# Patient Record
Sex: Female | Born: 1965 | Race: White | Hispanic: No | Smoking: Never smoker
Health system: Southern US, Community
[De-identification: ages and names within clinical notes are randomized; demographics above are authoritative.]

## PROBLEM LIST (undated history)

## (undated) DIAGNOSIS — R030 Elevated blood-pressure reading, without diagnosis of hypertension: Secondary | ICD-10-CM

## (undated) DIAGNOSIS — R32 Unspecified urinary incontinence: Secondary | ICD-10-CM

## (undated) DIAGNOSIS — Z8619 Personal history of other infectious and parasitic diseases: Secondary | ICD-10-CM

## (undated) DIAGNOSIS — I10 Essential (primary) hypertension: Secondary | ICD-10-CM

## (undated) DIAGNOSIS — G43909 Migraine, unspecified, not intractable, without status migrainosus: Secondary | ICD-10-CM

## (undated) DIAGNOSIS — R87629 Unspecified abnormal cytological findings in specimens from vagina: Secondary | ICD-10-CM

## (undated) DIAGNOSIS — D649 Anemia, unspecified: Secondary | ICD-10-CM

## (undated) DIAGNOSIS — K219 Gastro-esophageal reflux disease without esophagitis: Secondary | ICD-10-CM

## (undated) DIAGNOSIS — R0609 Other forms of dyspnea: Secondary | ICD-10-CM

## (undated) DIAGNOSIS — F410 Panic disorder [episodic paroxysmal anxiety] without agoraphobia: Secondary | ICD-10-CM

## (undated) DIAGNOSIS — J189 Pneumonia, unspecified organism: Secondary | ICD-10-CM

## (undated) DIAGNOSIS — F4310 Post-traumatic stress disorder, unspecified: Secondary | ICD-10-CM

## (undated) DIAGNOSIS — C541 Malignant neoplasm of endometrium: Secondary | ICD-10-CM

## (undated) DIAGNOSIS — M1990 Unspecified osteoarthritis, unspecified site: Secondary | ICD-10-CM

## (undated) DIAGNOSIS — Z8659 Personal history of other mental and behavioral disorders: Secondary | ICD-10-CM

## (undated) DIAGNOSIS — F32A Depression, unspecified: Secondary | ICD-10-CM

## (undated) DIAGNOSIS — I209 Angina pectoris, unspecified: Secondary | ICD-10-CM

## (undated) DIAGNOSIS — R600 Localized edema: Secondary | ICD-10-CM

## (undated) DIAGNOSIS — G8929 Other chronic pain: Secondary | ICD-10-CM

## (undated) DIAGNOSIS — F419 Anxiety disorder, unspecified: Secondary | ICD-10-CM

## (undated) DIAGNOSIS — Z8711 Personal history of peptic ulcer disease: Secondary | ICD-10-CM

## (undated) DIAGNOSIS — M797 Fibromyalgia: Secondary | ICD-10-CM

## (undated) DIAGNOSIS — Z862 Personal history of diseases of the blood and blood-forming organs and certain disorders involving the immune mechanism: Secondary | ICD-10-CM

## (undated) DIAGNOSIS — M549 Dorsalgia, unspecified: Secondary | ICD-10-CM

## (undated) DIAGNOSIS — N3946 Mixed incontinence: Secondary | ICD-10-CM

## (undated) HISTORY — PX: PERINEAL WOUND CLOSURE: SHX2217

## (undated) HISTORY — DX: Unspecified abnormal cytological findings in specimens from vagina: R87.629

## (undated) HISTORY — PX: APPENDECTOMY: SHX54

## (undated) HISTORY — PX: WISDOM TOOTH EXTRACTION: SHX21

## (undated) HISTORY — DX: Malignant neoplasm of endometrium: C54.1

## (undated) HISTORY — PX: BACK SURGERY: SHX140

## (undated) HISTORY — PX: OTHER SURGICAL HISTORY: SHX169

## (undated) HISTORY — PX: LUMBAR FUSION: SHX111

---

## 2007-05-02 ENCOUNTER — Ambulatory Visit (HOSPITAL_COMMUNITY): Admission: RE | Admit: 2007-05-02 | Discharge: 2007-05-02 | Payer: Self-pay | Admitting: Family Medicine

## 2007-05-02 ENCOUNTER — Emergency Department (HOSPITAL_COMMUNITY): Admission: EM | Admit: 2007-05-02 | Discharge: 2007-05-02 | Payer: Self-pay | Admitting: Emergency Medicine

## 2010-07-09 NOTE — Consult Note (Signed)
[name], [name] NO.:  [id number]   MEDICAL RECORD NO.:  [id number]          PATIENT TYPE:  EMS   LOCATION:  MAJO                         FACILITY:  [facility]   PHYSICIAN:  Cristi Loron, M.D.DATE OF BIRTH:  March 03, 1965   DATE OF CONSULTATION:  05/02/2007  DATE OF DISCHARGE:                                 CONSULTATION   CHIEF COMPLAINT:  Dizziness.   HISTORY OF PRESENT ILLNESS:  The patient is a 45 year old white female  who attempted to jump over evidently cracking some concrete last evening  approximately midnight when she fell striking her head.  There was brief  altered level of consciousness.  She did have some nausea and vomiting.  The patient went to sleep and this morning woke up with some dizziness  and vertigo.  She went to an urgent care where they ordered a CAT scan.  It demonstrated a small intracerebral hemorrhage and they recommended  she go to the emergency department.  The patient was evaluated by Dr.  Lisbeth Renshaw al.,  and a neurosurgical consultation was requested.   Presently, the patient admits to a minimal headache.  She still has some  dizziness and vertigo.  She has some chronic numbness in her right leg  but nothing new since the fall.  She denies neck pain, back pain,  weakness, etc.   PAST MEDICAL HISTORY:  The patient has history of lumbar spine problems.  She has had 2 lumbar fusions by neurosurgeons from [place].  She has had  chronic pain syndrome and is followed there.  Remote history of  appendicitis and stress urinary incontinence.   PAST SURGICAL HISTORY:  She has had 2 lumbar fusions as above in  [place], appendectomy, and repair with episiotomy.   MEDICATIONS:  Prior to admission, p.r.n. hydrocodone, methadone,  clonazepam, Topamax, and Detrol.   DRUG ALLERGIES:  CELEBREX causes swelling.   FAMILY MEDICAL HISTORY:  This patient's mother is age 44 and healthy.  The patient's father died of gunshot wound at age  38.   SOCIAL HISTORY:  The patient is married.  She has 2 children.  She lives  in [place], [place].  She is not employed.  She denies tobacco and  drug use.  She occasionally drinks alcohol.   REVIEW OF SYSTEMS:  Negative except as above.   PHYSICAL EXAMINATION:  GENERAL: A pleasant 45 year old white female in  no apparent distress.  HEENT: Normocephalic.  Pupils are equal, round, and reactive to light.  Extraocular muscles are intact.  Oropharynx is benign.  There was no  Battle sign or raccoon eyes.  No evidence of CSF, otorrhea, rhinorrhea,  hemotympanum, etc.  NECK: Supple.  There are no masses or deformities.  Good range of  motion.  No deformities, tracheal deviation etc.  Spurling testing is  negative.  Lhermitte sign was not present.  THORAX: Symmetric.  LUNGS: Clear to auscultation.  HEART: Regular rate and rhythm.  ABDOMEN: Soft and nontender.  EXTREMITIES: No obvious deformities.  BACK EXAM: The patient has a well-healed lumbar incision.  No signs of  infection.  NEUROLOGIC EXAM: The patient is alert and oriented x3.  Glasgow Coma  Scale 15.  Cranial nerves II through XII are examined bilaterally.  Grossly normal vision.  Hearing grossly normal bilaterally.  Motor  strength is 5/5 in her deltoids, biceps, triceps, hand grip, psoas,  quadriceps, gastrocnemius, and left extensor hallucis longus.  She gives  way in her right EHL.  Sensory exam demonstrates some decreased light  touch sensitivity in right lower extremity without change from baseline  status according to the patient.  Cerebellar function is intact and  rapid alternating movements of the upper extremities bilaterally.  Deep  tendon reflexes are 2/4 in bilateral biceps, triceps, brachioradialis,  quadriceps, and right gastrocnemius.  Trace 1/4 in left gastrocnemius.  There is no ankle clonus.   IMAGING STUDIES:  I reviewed the patient's cranial CT scans performed  today without contrast.  It  demonstrates the patient has a tiny inner  hemisphere of subdural hematoma and a tiny subarachnoid hemorrhage on  the left.   ASSESSMENT AND PLAN:  Closed head injury, subarachnoid hemorrhage, etc.  I have discussed the injury  with the patient and her cousin and  answered the patient's questions.  I have told her that she has minimal  intracerebral hemorrhage.  Presently, she is doing well.  I offered to  admit her for observation, but she prefers to go home.  Since this  injury was approximately 15 hours ago, I think that is fine and she is  going to be in the presence of her cousin for the next 24 hours.  I did  discuss whether should she develop decreased mental status, weakness,  mental status changes, etc., then she needs to come back to the  emergency room immediately.  I offered to have her follow up with me,  but it will be more convenient for her to follow up with a neurosurgeon  at [facility].  I gave her my card and I will discharge her and plan to see  her on p.r.n. basis except as above.  We also discussed the entity of  postconcussion syndrome.  I have answered all her questions.      Cristi Loron, M.D.  Electronically Signed     JDJ/MEDQ  D:  05/02/2007  T:  05/03/2007  Job:  [id number]

## 2010-11-18 LAB — I-STAT 8, (EC8 V) (CONVERTED LAB)
BUN: 7
Bicarbonate: 22.7
Chloride: 107
Glucose, Bld: 98
HCT: 44
Hemoglobin: 15
Operator id: 294521
Potassium: 3.7
Sodium: 137
TCO2: 24
pCO2, Ven: 31.9 — ABNORMAL LOW
pH, Ven: 7.46 — ABNORMAL HIGH

## 2010-11-18 LAB — DIFFERENTIAL
Basophils Absolute: 0.1
Basophils Relative: 0
Eosinophils Absolute: 0
Eosinophils Relative: 0
Lymphocytes Relative: 18
Lymphs Abs: 2.6
Monocytes Absolute: 0.8
Monocytes Relative: 6
Neutro Abs: 10.8 — ABNORMAL HIGH
Neutrophils Relative %: 76

## 2010-11-18 LAB — CBC
HCT: 39.9
Hemoglobin: 13.6
MCHC: 34.1
MCV: 83.8
Platelets: 381
RBC: 4.76
RDW: 13.8
WBC: 14.2 — ABNORMAL HIGH

## 2010-11-18 LAB — APTT: aPTT: 30

## 2010-11-18 LAB — PROTIME-INR
INR: 1
Prothrombin Time: 13.3

## 2010-11-18 LAB — POCT I-STAT CREATININE
Creatinine, Ser: 0.5
Operator id: 294521

## 2011-10-15 ENCOUNTER — Emergency Department (HOSPITAL_COMMUNITY)
Admission: EM | Admit: 2011-10-15 | Discharge: 2011-10-16 | Disposition: A | Discharge status: Home / Self Care | Payer: Self-pay | Attending: Emergency Medicine | Admitting: Emergency Medicine

## 2011-10-15 ENCOUNTER — Encounter (HOSPITAL_COMMUNITY): Payer: Self-pay | Admitting: Emergency Medicine

## 2011-10-15 DIAGNOSIS — N949 Unspecified condition associated with female genital organs and menstrual cycle: Secondary | ICD-10-CM | POA: Insufficient documentation

## 2011-10-15 DIAGNOSIS — N72 Inflammatory disease of cervix uteri: Secondary | ICD-10-CM | POA: Insufficient documentation

## 2011-10-15 DIAGNOSIS — N739 Female pelvic inflammatory disease, unspecified: Secondary | ICD-10-CM | POA: Insufficient documentation

## 2011-10-15 DIAGNOSIS — N938 Other specified abnormal uterine and vaginal bleeding: Secondary | ICD-10-CM | POA: Insufficient documentation

## 2011-10-15 NOTE — ED Notes (Signed)
Pt alert, arrives from home, c/o abd cramping, vaginal bleeding, onset was several days ago, resp even unlabored, skin pwd

## 2011-10-16 ENCOUNTER — Emergency Department (HOSPITAL_COMMUNITY): Payer: Self-pay

## 2011-10-16 LAB — BASIC METABOLIC PANEL
BUN: 10 mg/dL (ref 6–23)
CO2: 24 mEq/L (ref 19–32)
Calcium: 9.6 mg/dL (ref 8.4–10.5)
Chloride: 99 mEq/L (ref 96–112)
Creatinine, Ser: 0.6 mg/dL (ref 0.50–1.10)
GFR calc Af Amer: >90 mL/min (ref >90)
GFR calc non Af Amer: >90 mL/min (ref >90)
Glucose, Bld: 108 mg/dL — ABNORMAL HIGH (ref 70–99)
Potassium: 3.8 mEq/L (ref 3.5–5.1)
Sodium: 136 mEq/L (ref 135–145)

## 2011-10-16 LAB — URINALYSIS, ROUTINE W REFLEX MICROSCOPIC
Glucose, UA: NEGATIVE mg/dL
Nitrite: NEGATIVE
Protein, ur: 30 mg/dL — AB
Specific Gravity, Urine: 1.029 (ref 1.005–1.030)
Urobilinogen, UA: 1 mg/dL (ref 0.0–1.0)
pH: 6 (ref 5.0–8.0)

## 2011-10-16 LAB — CBC WITH DIFFERENTIAL/PLATELET
Basophils Absolute: 0 10*3/uL (ref 0.0–0.1)
Basophils Relative: 0 % (ref 0–1)
Eosinophils Absolute: 0 10*3/uL (ref 0.0–0.7)
Eosinophils Relative: 0 % (ref 0–5)
HCT: 40 % (ref 36.0–46.0)
Hemoglobin: 13.7 g/dL (ref 12.0–15.0)
Lymphocytes Relative: 25 % (ref 12–46)
Lymphs Abs: 3.2 10*3/uL (ref 0.7–4.0)
MCH: 26.9 pg (ref 26.0–34.0)
MCHC: 34.3 g/dL (ref 30.0–36.0)
MCV: 78.4 fL (ref 78.0–100.0)
Monocytes Absolute: 0.8 10*3/uL (ref 0.1–1.0)
Monocytes Relative: 6 % (ref 3–12)
Neutro Abs: 9 10*3/uL — ABNORMAL HIGH (ref 1.7–7.7)
Neutrophils Relative %: 69 % (ref 43–77)
Platelets: 400 10*3/uL (ref 150–400)
RBC: 5.1 MIL/uL (ref 3.87–5.11)
RDW: 14.8 % (ref 11.5–15.5)
WBC: 13 10*3/uL — ABNORMAL HIGH (ref 4.0–10.5)

## 2011-10-16 LAB — WET PREP, GENITAL
Trich, Wet Prep: NONE SEEN
Yeast Wet Prep HPF POC: NONE SEEN

## 2011-10-16 LAB — GC/CHLAMYDIA PROBE AMP, GENITAL
Chlamydia, DNA Probe: NEGATIVE
GC Probe Amp, Genital: NEGATIVE

## 2011-10-16 LAB — URINE MICROSCOPIC-ADD ON

## 2011-10-16 LAB — PREGNANCY, URINE: Preg Test, Ur: NEGATIVE

## 2011-10-16 MED ORDER — ACETAMINOPHEN-CODEINE #4 300-60 MG PO TABS: 1.0000 | ORAL_TABLET | ORAL | Status: AC | PRN

## 2011-10-16 MED ORDER — SODIUM CHLORIDE 0.9 % IV BOLUS (SEPSIS)
1000.0000 mL | Freq: Once | INTRAVENOUS | Status: AC
  Administered 2011-10-16: 1000 mL via INTRAVENOUS

## 2011-10-16 MED ORDER — KETOROLAC TROMETHAMINE 30 MG/ML IJ SOLN
30.0000 mg | Freq: Once | INTRAMUSCULAR | Status: AC
  Administered 2011-10-16: 30 mg via INTRAVENOUS
  Filled 2011-10-16: qty 1

## 2011-10-16 MED ORDER — SODIUM CHLORIDE 0.9 % IV SOLN
Freq: Once | INTRAVENOUS | Status: AC
  Administered 2011-10-16: 04:00:00 via INTRAVENOUS

## 2011-10-16 MED ORDER — DOXYCYCLINE HYCLATE 100 MG PO CAPS: 100.0000 mg | ORAL_CAPSULE | Freq: Two times a day (BID) | ORAL | Status: AC

## 2011-10-16 MED ORDER — DOXYCYCLINE HYCLATE 100 MG PO TABS
200.0000 mg | ORAL_TABLET | Freq: Once | ORAL | Status: AC
  Administered 2011-10-16: 200 mg via ORAL
  Filled 2011-10-16: qty 2

## 2011-10-16 MED ORDER — ONDANSETRON HCL 4 MG/2ML IJ SOLN
4.0000 mg | Freq: Once | INTRAMUSCULAR | Status: AC
  Administered 2011-10-16: 4 mg via INTRAVENOUS
  Filled 2011-10-16: qty 2

## 2011-10-16 MED ORDER — HYDROMORPHONE HCL PF 1 MG/ML IJ SOLN
1.0000 mg | Freq: Once | INTRAMUSCULAR | Status: AC
  Administered 2011-10-16: 1 mg via INTRAVENOUS
  Filled 2011-10-16: qty 1

## 2011-10-16 MED ORDER — ONDANSETRON 8 MG PO TBDP: 8.0000 mg | ORAL_TABLET | Freq: Three times a day (TID) | ORAL | Status: AC | PRN

## 2011-10-16 NOTE — ED Notes (Signed)
Pt to US via stretcher

## 2011-10-16 NOTE — ED Provider Notes (Signed)
History     CSN: [id number]  Arrival date & time 10/15/11  2316   First MD Initiated Contact with Patient 10/16/11 0301      Chief Complaint  Patient presents with  . Abdominal Pain    (Consider location/radiation/quality/duration/timing/severity/associated sxs/prior treatment) HPI Comments: Pt comes in with cc of suprapubic pain and vaginal bleeding. Pt has been having bleeding x 3 days, with LMP being just 2 weeks ago. She has hx of ASCUS. The bleeding is similar to her periods, with clots coming out. Pt feels heaviness in the suprapubic region. Pt also has lower quadrants and suprapubic pain - crampy, but sharp and throbbing. No trauma. No vaginal discharge. The pain is non radiating, and has worsened overnight. No UTI like sx, and no hx of renal stones.   Patient is a 46 y.o. female presenting with abdominal pain. The history is provided by the patient.  Abdominal Pain The primary symptoms of the illness include abdominal pain and vaginal bleeding. The primary symptoms of the illness do not include shortness of breath, nausea, vomiting, diarrhea or dysuria.  Symptoms associated with the illness do not include constipation, hematuria or frequency.    History reviewed. No pertinent past medical history.  Past Surgical History  Procedure Date  . Back surgery     No family history on file.  History  Substance Use Topics  . Smoking status: Never Smoker   . Smokeless tobacco: Not on file  . Alcohol Use: No    OB History    Grav Para Term Preterm Abortions TAB SAB Ect Mult Living                  Review of Systems  Constitutional: Negative for activity change.  HENT: Negative for facial swelling and neck pain.   Respiratory: Negative for cough, shortness of breath and wheezing.   Cardiovascular: Negative for chest pain.  Gastrointestinal: Positive for abdominal pain. Negative for nausea, vomiting, diarrhea, constipation, blood in stool and abdominal distention.    Genitourinary: Positive for vaginal bleeding. Negative for dysuria, frequency, hematuria, flank pain and difficulty urinating.  Skin: Negative for color change.  Neurological: Negative for speech difficulty and headaches.  Hematological: Does not bruise/bleed easily.  Psychiatric/Behavioral: Negative for confusion.    Allergies  Celebrex and Oxycontin  Home Medications   Current Outpatient Rx  Name Route Sig Dispense Refill  . ACETAMINOPHEN 500 MG PO TABS Oral Take 1,500 mg by mouth every 6 (six) hours as needed. Headache    . PSEUDOEPHEDRINE HCL 60 MG PO TABS Oral Take 60 mg by mouth every 4 (four) hours as needed. Sinuses    . ACETAMINOPHEN-CODEINE #4 300-60 MG PO TABS Oral Take 1 tablet by mouth every 4 (four) hours as needed for pain. 30 tablet 0  . DOXYCYCLINE HYCLATE 100 MG PO CAPS Oral Take 1 capsule (100 mg total) by mouth 2 (two) times daily. 28 capsule 0  . ONDANSETRON 8 MG PO TBDP Oral Take 1 tablet (8 mg total) by mouth every 8 (eight) hours as needed for nausea. 20 tablet 0    BP 130/88  Pulse 79  Temp 98.8 F (37.1 C) (Oral)  Resp 18  Ht 5\' 8"  (1.727 m)  Wt 270 lb (122.471 kg)  BMI 41.05 kg/m2  SpO2 100%  LMP 10/15/2011  Physical Exam  Constitutional: She is oriented to person, place, and time. She appears well-developed.  HENT:  Head: Normocephalic and atraumatic.  Eyes: Conjunctivae and EOM are normal.  Pupils are equal, round, and reactive to light.  Neck: Normal range of motion. Neck supple.  Cardiovascular: Normal rate, regular rhythm, normal heart sounds and intact distal pulses.   No murmur heard. Pulmonary/Chest: Effort normal. No respiratory distress. She has no wheezes.  Abdominal: Soft. Bowel sounds are normal. She exhibits no distension. There is tenderness. There is no rebound and no guarding.       Suprapubic tenderness, with guarding, but no rebounding  Genitourinary: Vagina normal and uterus normal.       External exam - normal, no  lesions Speculum exam: Pt has some white discharge, + bright blood. Bimanual exam: Patient has ++ CMT, no adnexal tenderness or fullness and cervical os is closed  Neurological: She is alert and oriented to person, place, and time.  Skin: Skin is warm and dry.    ED Course  Procedures (including critical care time)  Labs Reviewed  URINALYSIS, ROUTINE W REFLEX MICROSCOPIC - Abnormal; Notable for the following:    Color, Urine RED (*)  BIOCHEMICALS MAY BE AFFECTED BY COLOR   APPearance CLOUDY (*)     Hgb urine dipstick LARGE (*)     Bilirubin Urine MODERATE (*)     Ketones, ur TRACE (*)     Protein, ur 30 (*)     Leukocytes, UA MODERATE (*)     All other components within normal limits  CBC WITH DIFFERENTIAL - Abnormal; Notable for the following:    WBC 13.0 (*)     Neutro Abs 9.0 (*)     All other components within normal limits  BASIC METABOLIC PANEL - Abnormal; Notable for the following:    Glucose, Bld 108 (*)     All other components within normal limits  WET PREP, GENITAL - Abnormal; Notable for the following:    Clue Cells Wet Prep HPF POC FEW (*)     WBC, Wet Prep HPF POC FEW (*)     All other components within normal limits  PREGNANCY, URINE  URINE MICROSCOPIC-ADD ON  GC/CHLAMYDIA PROBE AMP, GENITAL   US Transvaginal Non-ob  10/16/2011  *RADIOLOGY REPORT*  Clinical Data: Vaginal bleeding and cramping.  TRANSABDOMINAL AND TRANSVAGINAL ULTRASOUND OF PELVIS Technique:  Both transabdominal and transvaginal ultrasound examinations of the pelvis were performed. Transabdominal technique was performed for global imaging of the pelvis including uterus, ovaries, adnexal regions, and pelvic cul-de-sac.  It was necessary to proceed with endovaginal exam following the transabdominal exam to visualize the endometrium and ovaries.  Comparison:  None  Findings:  Uterus: The uterus is anteverted and measures 9.3 x 4.8 x 4.4 cm. No myometrial masses demonstrated.  Nabothian cysts in the  cervical region.  A small cystic changes in the lower uterine segment and along the endometrium may represent changes of adenomyosis.  Endometrium: Normal endometrial stripe thickness is measured at 6 mm.  Right ovary:  The right ovary measures 3.4 x 2.5 x 2.5 cm. Visualization is limited but no abnormal adnexal masses are suggested.  Left ovary: The left ovary is not visualized.  Other findings: No free fluid  IMPRESSION: Nabothian cysts.  Tiny Nabothian cysts in the lower uterine segment and the adjacent to the endometrium versus adenomyosis.  Limited visualization of the ovaries.  Right ovary is identified but left ovary is not visualized.  No adnexal masses identified.   Original Report Authenticated By: Marlon Pel, M.D.    US Pelvis Complete  10/16/2011  *RADIOLOGY REPORT*  Clinical Data: Vaginal bleeding and cramping.  TRANSABDOMINAL AND TRANSVAGINAL ULTRASOUND OF PELVIS Technique:  Both transabdominal and transvaginal ultrasound examinations of the pelvis were performed. Transabdominal technique was performed for global imaging of the pelvis including uterus, ovaries, adnexal regions, and pelvic cul-de-sac.  It was necessary to proceed with endovaginal exam following the transabdominal exam to visualize the endometrium and ovaries.  Comparison:  None  Findings:  Uterus: The uterus is anteverted and measures 9.3 x 4.8 x 4.4 cm. No myometrial masses demonstrated.  Nabothian cysts in the cervical region.  A small cystic changes in the lower uterine segment and along the endometrium may represent changes of adenomyosis.  Endometrium: Normal endometrial stripe thickness is measured at 6 mm.  Right ovary:  The right ovary measures 3.4 x 2.5 x 2.5 cm. Visualization is limited but no abnormal adnexal masses are suggested.  Left ovary: The left ovary is not visualized.  Other findings: No free fluid  IMPRESSION: Nabothian cysts.  Tiny Nabothian cysts in the lower uterine segment and the adjacent to the  endometrium versus adenomyosis.  Limited visualization of the ovaries.  Right ovary is identified but left ovary is not visualized.  No adnexal masses identified.   Original Report Authenticated By: Marlon Pel, M.D.      1. Dysfunctional uterine bleeding   2. Pelvic inflammatory disease (PID)       MDM  DDx includes: Ectopic pregnancy Threatened abortion Inevitable abortion Miscarriage Spontaneous abortion Trauma/cerval laceration Placenta previa Placenta abruptio Ruptured uterus Cancer STD/PID Ovarian torsion Ovarian cyst/hemorrhagic cyst Appendicitis  A/P- Pt comes in with cc of abd pain and vaginal bleeding. The exam shows suprapubic tenderness with CMT. Will treat as PID, and give GC and chlamydia tx. Pt has 1 partner, but having unprotected intercourse. Pregnancy test is negative - all pregnancy related etiology's are essentially rules out. [place] abd will bo ordered given hx of ASCUS to make sure there is no finding suggestive of cancer and to r/o ovarian cyst. Hemodynamically stable, and otherwise healthy woman, thus vascular etiology less likely.        Derwood Kaplan, MD 10/16/11 [phone]

## 2011-10-16 NOTE — ED Notes (Signed)
Pt reports bleeding for a week and intermittent cramping for two days

## 2012-09-20 ENCOUNTER — Emergency Department (HOSPITAL_COMMUNITY): Payer: No Typology Code available for payment source

## 2012-09-20 ENCOUNTER — Emergency Department (HOSPITAL_COMMUNITY)
Admission: EM | Admit: 2012-09-20 | Discharge: 2012-09-20 | Disposition: A | Discharge status: Home / Self Care | Payer: No Typology Code available for payment source | Attending: Emergency Medicine | Admitting: Emergency Medicine

## 2012-09-20 ENCOUNTER — Encounter (HOSPITAL_COMMUNITY): Payer: Self-pay | Admitting: Emergency Medicine

## 2012-09-20 DIAGNOSIS — S20219A Contusion of unspecified front wall of thorax, initial encounter: Secondary | ICD-10-CM | POA: Insufficient documentation

## 2012-09-20 DIAGNOSIS — S39012A Strain of muscle, fascia and tendon of lower back, initial encounter: Secondary | ICD-10-CM

## 2012-09-20 DIAGNOSIS — Y92410 Unspecified street and highway as the place of occurrence of the external cause: Secondary | ICD-10-CM | POA: Insufficient documentation

## 2012-09-20 DIAGNOSIS — S335XXA Sprain of ligaments of lumbar spine, initial encounter: Secondary | ICD-10-CM | POA: Insufficient documentation

## 2012-09-20 DIAGNOSIS — S20212A Contusion of left front wall of thorax, initial encounter: Secondary | ICD-10-CM

## 2012-09-20 DIAGNOSIS — S29019A Strain of muscle and tendon of unspecified wall of thorax, initial encounter: Secondary | ICD-10-CM

## 2012-09-20 DIAGNOSIS — Z9889 Other specified postprocedural states: Secondary | ICD-10-CM | POA: Insufficient documentation

## 2012-09-20 DIAGNOSIS — S161XXA Strain of muscle, fascia and tendon at neck level, initial encounter: Secondary | ICD-10-CM

## 2012-09-20 DIAGNOSIS — S139XXA Sprain of joints and ligaments of unspecified parts of neck, initial encounter: Secondary | ICD-10-CM | POA: Insufficient documentation

## 2012-09-20 DIAGNOSIS — Z79899 Other long term (current) drug therapy: Secondary | ICD-10-CM | POA: Insufficient documentation

## 2012-09-20 DIAGNOSIS — S239XXA Sprain of unspecified parts of thorax, initial encounter: Secondary | ICD-10-CM | POA: Insufficient documentation

## 2012-09-20 DIAGNOSIS — Y93I9 Activity, other involving external motion: Secondary | ICD-10-CM | POA: Insufficient documentation

## 2012-09-20 MED ORDER — HYDROMORPHONE HCL PF 2 MG/ML IJ SOLN
2.0000 mg | Freq: Once | INTRAMUSCULAR | Status: AC
  Administered 2012-09-20: 2 mg via INTRAMUSCULAR
  Filled 2012-09-20: qty 1

## 2012-09-20 MED ORDER — ORPHENADRINE CITRATE ER 100 MG PO TB12: 100.0000 mg | ORAL_TABLET | Freq: Two times a day (BID) | ORAL | Status: DC

## 2012-09-20 MED ORDER — ONDANSETRON 4 MG PO TBDP
ORAL_TABLET | ORAL | Status: AC
  Filled 2012-09-20: qty 2

## 2012-09-20 MED ORDER — HYDROCODONE-ACETAMINOPHEN 5-325 MG PO TABS: 1.0000 | ORAL_TABLET | ORAL | Status: DC | PRN

## 2012-09-20 MED ORDER — ONDANSETRON 4 MG PO TBDP
8.0000 mg | ORAL_TABLET | Freq: Once | ORAL | Status: AC
  Administered 2012-09-20: 8 mg via ORAL
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
## 2012-09-20 NOTE — ED Notes (Signed)
LSB and head blocks removed per Dr. Gary Fleet.

## 2012-09-20 NOTE — ED Notes (Signed)
Patient transported to X-ray 
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
## 2012-09-20 NOTE — ED Notes (Addendum)
Pt. Was being escorted out to car when she vomited small amount of emesis in bag. She was brought back to room. Pt. Advised she was still ok to be discharged that it was probably the pain med that was making her sick. She was given Zofran not long ago. Pt. Escorted to waiting room with friend. MD was made aware of Pt. Vomiting agreed she was ok to d/c.

## 2012-09-21 ENCOUNTER — Emergency Department (HOSPITAL_COMMUNITY)
Admission: EM | Admit: 2012-09-21 | Discharge: 2012-09-21 | Disposition: A | Discharge status: Home / Self Care | Payer: No Typology Code available for payment source | Attending: Emergency Medicine | Admitting: Emergency Medicine

## 2012-09-21 ENCOUNTER — Encounter (HOSPITAL_COMMUNITY): Payer: Self-pay | Admitting: Emergency Medicine

## 2012-09-21 ENCOUNTER — Emergency Department (HOSPITAL_COMMUNITY): Payer: No Typology Code available for payment source

## 2012-09-21 DIAGNOSIS — IMO0002 Reserved for concepts with insufficient information to code with codable children: Secondary | ICD-10-CM | POA: Insufficient documentation

## 2012-09-21 DIAGNOSIS — R42 Dizziness and giddiness: Secondary | ICD-10-CM | POA: Insufficient documentation

## 2012-09-21 DIAGNOSIS — R110 Nausea: Secondary | ICD-10-CM

## 2012-09-21 DIAGNOSIS — M549 Dorsalgia, unspecified: Secondary | ICD-10-CM

## 2012-09-21 DIAGNOSIS — Y9389 Activity, other specified: Secondary | ICD-10-CM | POA: Insufficient documentation

## 2012-09-21 DIAGNOSIS — Y92410 Unspecified street and highway as the place of occurrence of the external cause: Secondary | ICD-10-CM | POA: Insufficient documentation

## 2012-09-21 DIAGNOSIS — H538 Other visual disturbances: Secondary | ICD-10-CM | POA: Insufficient documentation

## 2012-09-21 DIAGNOSIS — S139XXA Sprain of joints and ligaments of unspecified parts of neck, initial encounter: Secondary | ICD-10-CM | POA: Insufficient documentation

## 2012-09-21 DIAGNOSIS — R209 Unspecified disturbances of skin sensation: Secondary | ICD-10-CM | POA: Insufficient documentation

## 2012-09-21 DIAGNOSIS — S161XXD Strain of muscle, fascia and tendon at neck level, subsequent encounter: Secondary | ICD-10-CM

## 2012-09-21 DIAGNOSIS — S0990XA Unspecified injury of head, initial encounter: Secondary | ICD-10-CM | POA: Insufficient documentation

## 2012-09-21 DIAGNOSIS — Z79899 Other long term (current) drug therapy: Secondary | ICD-10-CM | POA: Insufficient documentation

## 2012-09-21 MED ORDER — ONDANSETRON 8 MG PO TBDP
8.0000 mg | ORAL_TABLET | Freq: Once | ORAL | Status: AC
  Administered 2012-09-21: 8 mg via ORAL
  Filled 2012-09-21: qty 1

## 2012-09-21 MED ORDER — PROMETHAZINE HCL 12.5 MG PO TABS: 12.5000 mg | ORAL_TABLET | Freq: Four times a day (QID) | ORAL | Status: DC | PRN

## 2012-09-21 MED ORDER — OXYCODONE-ACETAMINOPHEN 5-325 MG PO TABS
1.0000 | ORAL_TABLET | Freq: Once | ORAL | Status: AC
  Administered 2012-09-21: 1 via ORAL
  Filled 2012-09-21: qty 1
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
## 2012-09-21 NOTE — ED Provider Notes (Signed)
Medical screening examination/treatment/procedure(s) were performed by non-physician practitioner and as supervising physician I was immediately available for consultation/collaboration.   Merwin Breden, MD 09/21/12 2304 
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
## 2012-10-13 ENCOUNTER — Emergency Department (HOSPITAL_COMMUNITY)
Admission: EM | Admit: 2012-10-13 | Discharge: 2012-10-13 | Disposition: A | Discharge status: Home / Self Care | Payer: No Typology Code available for payment source | Attending: Emergency Medicine | Admitting: Emergency Medicine

## 2012-10-13 ENCOUNTER — Encounter (HOSPITAL_COMMUNITY): Payer: Self-pay | Admitting: *Deleted

## 2012-10-13 ENCOUNTER — Emergency Department (HOSPITAL_COMMUNITY): Payer: No Typology Code available for payment source

## 2012-10-13 DIAGNOSIS — Z9889 Other specified postprocedural states: Secondary | ICD-10-CM | POA: Insufficient documentation

## 2012-10-13 DIAGNOSIS — K644 Residual hemorrhoidal skin tags: Secondary | ICD-10-CM | POA: Insufficient documentation

## 2012-10-13 DIAGNOSIS — Z87828 Personal history of other (healed) physical injury and trauma: Secondary | ICD-10-CM | POA: Insufficient documentation

## 2012-10-13 DIAGNOSIS — Z79899 Other long term (current) drug therapy: Secondary | ICD-10-CM | POA: Insufficient documentation

## 2012-10-13 DIAGNOSIS — R209 Unspecified disturbances of skin sensation: Secondary | ICD-10-CM | POA: Insufficient documentation

## 2012-10-13 DIAGNOSIS — M549 Dorsalgia, unspecified: Secondary | ICD-10-CM | POA: Insufficient documentation

## 2012-10-13 LAB — URINALYSIS, ROUTINE W REFLEX MICROSCOPIC
Bilirubin Urine: NEGATIVE
Glucose, UA: NEGATIVE mg/dL
Hgb urine dipstick: NEGATIVE
Ketones, ur: NEGATIVE mg/dL
Nitrite: NEGATIVE
Protein, ur: NEGATIVE mg/dL
Specific Gravity, Urine: 1.03 (ref 1.005–1.030)
Urobilinogen, UA: 0.2 mg/dL (ref 0.0–1.0)
pH: 5.5 (ref 5.0–8.0)

## 2012-10-13 LAB — CBC WITH DIFFERENTIAL/PLATELET
Basophils Absolute: 0 10*3/uL (ref 0.0–0.1)
Basophils Relative: 0 % (ref 0–1)
Eosinophils Absolute: 0 10*3/uL (ref 0.0–0.7)
Eosinophils Relative: 0 % (ref 0–5)
HCT: 38.2 % (ref 36.0–46.0)
Hemoglobin: 12.6 g/dL (ref 12.0–15.0)
Lymphocytes Relative: 27 % (ref 12–46)
Lymphs Abs: 3.1 10*3/uL (ref 0.7–4.0)
MCH: 26.6 pg (ref 26.0–34.0)
MCHC: 33 g/dL (ref 30.0–36.0)
MCV: 80.6 fL (ref 78.0–100.0)
Monocytes Absolute: 0.6 10*3/uL (ref 0.1–1.0)
Monocytes Relative: 5 % (ref 3–12)
Neutro Abs: 7.9 10*3/uL — ABNORMAL HIGH (ref 1.7–7.7)
Neutrophils Relative %: 68 % (ref 43–77)
Platelets: 435 10*3/uL — ABNORMAL HIGH (ref 150–400)
RBC: 4.74 MIL/uL (ref 3.87–5.11)
RDW: 14.1 % (ref 11.5–15.5)
WBC: 11.6 10*3/uL — ABNORMAL HIGH (ref 4.0–10.5)

## 2012-10-13 LAB — BASIC METABOLIC PANEL
BUN: 8 mg/dL (ref 6–23)
CO2: 24 mEq/L (ref 19–32)
Calcium: 8.9 mg/dL (ref 8.4–10.5)
Chloride: 101 mEq/L (ref 96–112)
Creatinine, Ser: 0.55 mg/dL (ref 0.50–1.10)
GFR calc Af Amer: >90 mL/min (ref >90)
GFR calc non Af Amer: >90 mL/min (ref >90)
Glucose, Bld: 105 mg/dL — ABNORMAL HIGH (ref 70–99)
Potassium: 3.3 mEq/L — ABNORMAL LOW (ref 3.5–5.1)
Sodium: 136 mEq/L (ref 135–145)

## 2012-10-13 LAB — URINE MICROSCOPIC-ADD ON

## 2012-10-13 MED ORDER — LORAZEPAM 2 MG/ML IJ SOLN
0.5000 mg | Freq: Once | INTRAMUSCULAR | Status: AC
  Administered 2012-10-13: 0.5 mg via INTRAVENOUS
  Filled 2012-10-13: qty 1

## 2012-10-13 MED ORDER — NITROFURANTOIN MONOHYD MACRO 100 MG PO CAPS: 100.0000 mg | ORAL_CAPSULE | Freq: Two times a day (BID) | ORAL | Status: DC

## 2012-10-13 MED ORDER — OXYCODONE-ACETAMINOPHEN 5-325 MG PO TABS
2.0000 | ORAL_TABLET | Freq: Once | ORAL | Status: AC
  Administered 2012-10-13: 2 via ORAL
  Filled 2012-10-13: qty 2

## 2012-10-13 MED ORDER — FENTANYL CITRATE 0.05 MG/ML IJ SOLN: 100.0000 ug | Freq: Once | INTRAMUSCULAR | Status: DC

## 2012-10-13 MED ORDER — ONDANSETRON HCL 4 MG/2ML IJ SOLN
4.0000 mg | Freq: Once | INTRAMUSCULAR | Status: AC
  Administered 2012-10-13: 4 mg via INTRAVENOUS
  Filled 2012-10-13: qty 2

## 2012-10-13 MED ORDER — METRONIDAZOLE 500 MG PO TABS: 2000.0000 mg | ORAL_TABLET | Freq: Once | ORAL | Status: DC

## 2012-10-13 MED ORDER — OXYCODONE-ACETAMINOPHEN 5-325 MG PO TABS: 1.0000 | ORAL_TABLET | Freq: Four times a day (QID) | ORAL | Status: DC | PRN

## 2012-10-13 MED ORDER — FENTANYL CITRATE 0.05 MG/ML IJ SOLN
100.0000 ug | Freq: Once | INTRAMUSCULAR | Status: AC
  Administered 2012-10-13: 100 ug via INTRAVENOUS
  Filled 2012-10-13: qty 2

## 2012-10-13 NOTE — ED Provider Notes (Signed)
Medical screening examination/treatment/procedure(s) were performed by non-physician practitioner and as supervising physician I was immediately available for consultation/collaboration.   Claudean Kinds, MD 10/13/12 2101
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
## 2012-10-13 NOTE — ED Notes (Signed)
PA at bedside.
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
## 2012-10-13 NOTE — ED Notes (Signed)
Patient transported to MRI 

## 2012-10-13 NOTE — ED Notes (Signed)
Patient returned from MRI.

## 2014-10-07 ENCOUNTER — Emergency Department (HOSPITAL_COMMUNITY)
Admission: EM | Admit: 2014-10-07 | Discharge: 2014-10-07 | Disposition: A | Discharge status: Home / Self Care | Payer: Self-pay | Attending: Emergency Medicine | Admitting: Emergency Medicine

## 2014-10-07 ENCOUNTER — Emergency Department (HOSPITAL_COMMUNITY): Payer: Self-pay

## 2014-10-07 ENCOUNTER — Encounter (HOSPITAL_COMMUNITY): Payer: Self-pay | Admitting: Emergency Medicine

## 2014-10-07 DIAGNOSIS — R0602 Shortness of breath: Secondary | ICD-10-CM | POA: Insufficient documentation

## 2014-10-07 DIAGNOSIS — R0789 Other chest pain: Secondary | ICD-10-CM | POA: Insufficient documentation

## 2014-10-07 DIAGNOSIS — M79651 Pain in right thigh: Secondary | ICD-10-CM | POA: Insufficient documentation

## 2014-10-07 DIAGNOSIS — M546 Pain in thoracic spine: Secondary | ICD-10-CM | POA: Insufficient documentation

## 2014-10-07 LAB — BASIC METABOLIC PANEL
Anion gap: 9 (ref 5–15)
BUN: 13 mg/dL (ref 6–20)
CO2: 26 mmol/L (ref 22–32)
Calcium: 9.1 mg/dL (ref 8.9–10.3)
Chloride: 103 mmol/L (ref 101–111)
Creatinine, Ser: 0.7 mg/dL (ref 0.44–1.00)
GFR calc Af Amer: >60 mL/min (ref >60)
GFR calc non Af Amer: >60 mL/min (ref >60)
Glucose, Bld: 132 mg/dL — ABNORMAL HIGH (ref 65–99)
Potassium: 3.5 mmol/L (ref 3.5–5.1)
Sodium: 138 mmol/L (ref 135–145)

## 2014-10-07 LAB — CBC
HCT: 39 % (ref 36.0–46.0)
Hemoglobin: 12.8 g/dL (ref 12.0–15.0)
MCH: 27.8 pg (ref 26.0–34.0)
MCHC: 32.8 g/dL (ref 30.0–36.0)
MCV: 84.6 fL (ref 78.0–100.0)
Platelets: 344 10*3/uL (ref 150–400)
RBC: 4.61 MIL/uL (ref 3.87–5.11)
RDW: 13.8 % (ref 11.5–15.5)
WBC: 10.7 10*3/uL — ABNORMAL HIGH (ref 4.0–10.5)

## 2014-10-07 LAB — I-STAT TROPONIN, ED: Troponin i, poc: 0 ng/mL (ref 0.00–0.08)

## 2014-10-07 LAB — D-DIMER, QUANTITATIVE: D-Dimer, Quant: <0.27 ug/mL-FEU (ref 0.00–0.48)

## 2014-10-07 LAB — BRAIN NATRIURETIC PEPTIDE: B Natriuretic Peptide: 11.6 pg/mL (ref 0.0–100.0)

## 2014-10-07 MED ORDER — KETOROLAC TROMETHAMINE 30 MG/ML IJ SOLN: 30.0000 mg | Freq: Once | INTRAMUSCULAR | Status: DC

## 2014-10-07 MED ORDER — TRAMADOL HCL 50 MG PO TABS: 50.0000 mg | ORAL_TABLET | Freq: Four times a day (QID) | ORAL | Status: DC | PRN

## 2014-10-07 MED ORDER — KETOROLAC TROMETHAMINE 30 MG/ML IJ SOLN
30.0000 mg | Freq: Once | INTRAMUSCULAR | Status: AC
  Administered 2014-10-07: 30 mg via INTRAVENOUS
  Filled 2014-10-07: qty 1

## 2014-10-07 MED ORDER — HYDROXYZINE PAMOATE 25 MG PO CAPS: 25.0000 mg | ORAL_CAPSULE | Freq: Three times a day (TID) | ORAL | Status: DC | PRN

## 2014-10-07 NOTE — ED Provider Notes (Signed)
CSN: [id number]     Arrival date & time 10/07/14  0144 History   First MD Initiated Contact with Patient 10/07/14 [phone]     Chief Complaint  Patient presents with  . Chest Pain  . Shortness of Breath     (Consider location/radiation/quality/duration/timing/severity/associated sxs/prior Treatment) HPI Comments: Patient states that she's had intermittent episodes of chest pain radiating through to her upper back with exertion for the past 3-4 months.  Tonight.  She's noticed that the pain is lasting for longer.  She also reports swelling of her right thigh with tenderness.  She is very concerned that she might have a pulmonary embolus.  She does not have a primary care physician.  She takes no medication on a regular basis  Patient is a 49 y.o. female presenting with chest pain and shortness of breath. The history is provided by the patient.  Chest Pain Chest pain location: Bandlike discomfort across upper chest. Pain quality: aching   Pain radiates to:  Does not radiate Pain radiates to the back: yes   Pain severity:  Mild Onset quality:  Gradual Timing:  Intermittent Progression:  Worsening Chronicity:  Recurrent Context: breathing   Context comment:  Exertion Relieved by:  Rest Worsened by:  Exertion Ineffective treatments:  None tried Associated symptoms: anxiety, back pain, lower extremity edema and shortness of breath   Associated symptoms: no abdominal pain and no cough   Shortness of Breath Associated symptoms: chest pain   Associated symptoms: no abdominal pain, no cough and no rash     History reviewed. No pertinent past medical history. Past Surgical History  Procedure Laterality Date  . Back surgery    . Lumbar  fusions     No family history on file. Social History  Substance Use Topics  . Smoking status: Never Smoker   . Smokeless tobacco: None  . Alcohol Use: No   OB History    No data available     Review of Systems  Constitutional: Negative for  activity change and appetite change.  HENT: Negative.   Respiratory: Positive for shortness of breath. Negative for cough.   Cardiovascular: Positive for chest pain.  Gastrointestinal: Negative.  Negative for abdominal pain.  Genitourinary: Negative.   Musculoskeletal: Positive for back pain.  Skin: Negative for rash.  Neurological: Negative.   Hematological: Negative.   Psychiatric/Behavioral: Negative.   All other systems reviewed and are negative.     Allergies  Celebrex and Oxycontin  Home Medications   Prior to Admission medications   Medication Sig Start Date End Date Taking? Authorizing Provider  acetaminophen (TYLENOL) 500 MG tablet Take 1,500 mg by mouth every 6 (six) hours as needed (for migraines). Headache    Historical Provider, MD  HYDROcodone-acetaminophen (NORCO/VICODIN) 5-325 MG per tablet Take 1 tablet by mouth every 4 (four) hours as needed for pain. Patient not taking: Reported on 10/07/2014 09/20/12   Ernst Spell, MD  metroNIDAZOLE (FLAGYL) 500 MG tablet Take 4 tablets (2,000 mg total) by mouth once. Patient not taking: Reported on 10/07/2014 10/13/12   Carlisle Cater, PA-C  nitrofurantoin, macrocrystal-monohydrate, (MACROBID) 100 MG capsule Take 1 capsule (100 mg total) by mouth 2 (two) times daily. Patient not taking: Reported on 10/07/2014 10/13/12   Carlisle Cater, PA-C  oxyCODONE-acetaminophen (PERCOCET/ROXICET) 5-325 MG per tablet Take 1-2 tablets by mouth every 6 (six) hours as needed for pain. Patient not taking: Reported on 10/07/2014 10/13/12   Carlisle Cater, PA-C  promethazine (PHENERGAN) 12.5 MG tablet Take 1  tablet (12.5 mg total) by mouth every 6 (six) hours as needed for nausea. Patient not taking: Reported on 10/07/2014 09/21/12   Tatyana Kirichenko, PA-C  traMADol (ULTRAM) 50 MG tablet Take 1 tablet (50 mg total) by mouth every 6 (six) hours as needed. 10/07/14   Junius Creamer, NP   BP 127/76 mmHg  Pulse 95  Temp(Src) 97.9 F (36.6 C) (Oral)  Resp 25   SpO2 97%  LMP 09/23/2014 Physical Exam  Constitutional: She is oriented to person, place, and time. She appears well-developed and well-nourished.  HENT:  Head: Normocephalic.  Eyes: Pupils are equal, round, and reactive to light.  Cardiovascular: Normal rate and regular rhythm.   Pulmonary/Chest: Effort normal and breath sounds normal. No respiratory distress.  Abdominal: Soft.  Examination difficult due to body habitus  Musculoskeletal: Normal range of motion. She exhibits tenderness. She exhibits no edema.       Legs: Neurological: She is alert and oriented to person, place, and time.  Skin: Skin is warm and dry. No erythema.  Nursing note and vitals reviewed.   ED Course  Procedures (including critical care time) Labs Review Labs Reviewed  BASIC METABOLIC PANEL - Abnormal; Notable for the following:    Glucose, Bld 132 (*)    All other components within normal limits  CBC - Abnormal; Notable for the following:    WBC 10.7 (*)    All other components within normal limits  D-DIMER, QUANTITATIVE (NOT AT [facility])  BRAIN NATRIURETIC PEPTIDE  I-STAT TROPOININ, ED    Imaging Review Dg Chest 2 View  10/07/2014   CLINICAL DATA:  Acute onset of shortness of breath, generalized chest pain, cough and right leg swelling. Initial encounter.  EXAM: CHEST  2 VIEW  COMPARISON:  Chest radiograph performed 09/20/2012  FINDINGS: The lungs are well-aerated. Mild vascular congestion is noted. There is no evidence of focal opacification, pleural effusion or pneumothorax.  The heart is normal in size; the mediastinal contour is within normal limits. No acute osseous abnormalities are seen.  IMPRESSION: Mild vascular congestion, without significant pulmonary edema.   Electronically Signed   By: Garald Balding M.D.   On: 10/07/2014 02:37   I, Jerri Glauser K, personally reviewed and evaluated these images and lab results as part of my medical decision-making.   EKG Interpretation   Date/Time:   Saturday October 07 2014 01:51:41 EDT Ventricular Rate:  97 PR Interval:  171 QRS Duration: 97 QT Interval:  381 QTC Calculation: 484 R Axis:   6 Text Interpretation:  Sinus rhythm Confirmed by Glynn Octave  [phone]) on 10/07/2014 5:35:05 AM     Patient does not have any interval change in her chest x-ray.  She has mild vascular congestion, with normal white count.  She does not have a pulmonary embolus ruled out by a negative d-dimer.  She has been given resources to find a primary care physician should return parameters and a prescription for Ultram for her discomfort At time of discharge.  Patient states she's been under tremendous amount of stress and rest for the last 3 months and feels that her discomfort might be stress related.  She is requesting medication to help control this until she can make an appointment care physician.  She is given a prescription for Vistaril. MDM   Final diagnoses:  Chest pain, atypical         Junius Creamer, NP 10/07/14 [place], NP 10/07/14 3419  Everlene Balls, MD 10/07/14 [phone]

## 2014-10-07 NOTE — ED Notes (Signed)
Pt ambulated around nursing station, O2 sats consistently stayed at 96% on RA.

## 2014-10-07 NOTE — Discharge Instructions (Signed)
Chest Pain (Nonspecific) °It is often hard to give a specific diagnosis for the cause of chest pain. There is always a chance that your pain could be related to something serious, such as a heart attack or a blood clot in the lungs. You need to follow up with your health care provider for further evaluation. °CAUSES  °· Heartburn. °· Pneumonia or bronchitis. °· Anxiety or stress. °· Inflammation around your heart (pericarditis) or lung (pleuritis or pleurisy). °· A blood clot in the lung. °· A collapsed lung (pneumothorax). It can develop suddenly on its own (spontaneous pneumothorax) or from trauma to the chest. °· Shingles infection (herpes zoster virus). °The chest wall is composed of bones, muscles, and cartilage. Any of these can be the source of the pain. °· The bones can be bruised by injury. °· The muscles or cartilage can be strained by coughing or overwork. °· The cartilage can be affected by inflammation and become sore (costochondritis). °DIAGNOSIS  °Lab tests or other studies may be needed to find the cause of your pain. Your health care provider may have you take a test called an ambulatory electrocardiogram (ECG). An ECG records your heartbeat patterns over a 24-hour period. You may also have other tests, such as: °· Transthoracic echocardiogram (TTE). During echocardiography, sound waves are used to evaluate how blood flows through your heart. °· Transesophageal echocardiogram (TEE). °· Cardiac monitoring. This allows your health care provider to monitor your heart rate and rhythm in real time. °· Holter monitor. This is a portable device that records your heartbeat and can help diagnose heart arrhythmias. It allows your health care provider to track your heart activity for several days, if needed. °· Stress tests by exercise or by giving medicine that makes the heart beat faster. °TREATMENT  °· Treatment depends on what may be causing your chest pain. Treatment may include: °¨ Acid blockers for  heartburn. °¨ Anti-inflammatory medicine. °¨ Pain medicine for inflammatory conditions. °¨ Antibiotics if an infection is present. °· You may be advised to change lifestyle habits. This includes stopping smoking and avoiding alcohol, caffeine, and chocolate. °· You may be advised to keep your head raised (elevated) when sleeping. This reduces the chance of acid going backward from your stomach into your esophagus. °Most of the time, nonspecific chest pain will improve within 2-3 days with rest and mild pain medicine.  °HOME CARE INSTRUCTIONS  °· If antibiotics were prescribed, take them as directed. Finish them even if you start to feel better. °· For the next few days, avoid physical activities that bring on chest pain. Continue physical activities as directed. °· Do not use any tobacco products, including cigarettes, chewing tobacco, or electronic cigarettes. °· Avoid drinking alcohol. °· Only take medicine as directed by your health care provider. °· Follow your health care provider's suggestions for further testing if your chest pain does not go away. °· Keep any follow-up appointments you made. If you do not go to an appointment, you could develop lasting (chronic) problems with pain. If there is any problem keeping an appointment, call to reschedule. °SEEK MEDICAL CARE IF:  °· Your chest pain does not go away, even after treatment. °· You have a rash with blisters on your chest. °· You have a fever. °SEEK IMMEDIATE MEDICAL CARE IF:  °· You have increased chest pain or pain that spreads to your arm, neck, jaw, back, or abdomen. °· You have shortness of breath. °· You have an increasing cough, or you cough   up blood.  You have severe back or abdominal pain.  You feel nauseous or vomit.  You have severe weakness.  You faint.  You have chills. This is an emergency. Do not wait to see if the pain will go away. Get medical help at once. Call your local emergency services (911 in [place] Do not drive  yourself to the hospital. MAKE SURE YOU:   Understand these instructions.  Will watch your condition.  Will get help right away if you are not doing well or get worse. Document Released: 11/20/2004 Document Revised: 02/15/2013 Document Reviewed: 09/16/2007 [facility] Patient Information 2015 [place], [place]. This information is not intended to replace advice given to you by your health care provider. Make sure you discuss any questions you have with your health care provider. Today you were evaluated for your chest pain has been going on for months, worse with exertion.  He was concerned about a pulmonary embolus or blood clot as well.  This has been ruled out by vital signs and blood work.  You've been given a prescription for discomfort and a resource list to help you find a primary care physician.  Please return anytime you develop new or worsening symptoms    Emergency Department Resource Guide 1) Find a Doctor and Pay Out of Pocket Although you won't have to find out who is covered by your insurance plan, it is a good idea to ask around and get recommendations. You will then need to call the office and see if the doctor you have chosen will accept you as a new patient and what types of options they offer for patients who are self-pay. Some doctors offer discounts or will set up payment plans for their patients who do not have insurance, but you will need to ask so you aren't surprised when you get to your appointment.  2) Contact Your Local Health Department Not all health departments have doctors that can see patients for sick visits, but many do, so it is worth a call to see if yours does. If you don't know where your local health department is, you can check in your phone book. The CDC also has a tool to help you locate your state's health department, and many state websites also have listings of all of their local health departments.  3) Find a [facility] If your illness is not  likely to be very severe or complicated, you may want to try a walk in clinic. These are popping up all over the country in pharmacies, drugstores, and shopping centers. They're usually staffed by nurse practitioners or physician assistants that have been trained to treat common illnesses and complaints. They're usually fairly quick and inexpensive. However, if you have serious medical issues or chronic medical problems, these are probably not your best option.  No Primary Care Doctor: - Call Health Connect at  [phone] - they can help you locate a primary care doctor that  accepts your insurance, provides certain services, etc. - Physician Referral Service- [phone]  Chronic Pain Problems: Organization         Address  Phone   Notes  [facility]  [phone] Patients need to be referred by their primary care doctor.   Medication Assistance: Organization         Address  Phone   Notes  [facility] Medication [facility] [place] [place], [place] [address] [phone] --Must be a resident of [facility] --  Must have NO insurance coverage whatsoever (no Medicaid/ Medicare, etc.) -- The pt. MUST have a primary care doctor that directs their care regularly and follows them in the community   [facility]ist  [phone]   [employer]  [phone]    Agencies that provide inexpensive medical care: Organization         Address  Phone   Notes  [place]  [phone]   [facility]    [phone]   [facility] [place], [place] [address] [phone]   [place] [address], [place] [phone]   Planned Parenthood    [phone]   [facility]    [phone]   [place] and [place] [address], [place] Phone:  [phone], Fax:  [phone] Hours of Operation:  9 am - 6 pm,  M-F.  Also accepts Medicaid/Medicare and self-pay.  [facility] for Lexa [place], [address], [place] Phone: [phone], Fax: [phone]. Hours of Operation:  8:30 am - 5:30 pm, M-F.  Also accepts Medicaid and self-pay.  [facility] [address], [place] Phone: [phone]   [place], [place], [place] [phone], Ext. [phone] Mondays & Thursdays: 7-9 AM.  First 15 patients are seen on a first come, first serve basis.    [place] Providers:  Organization         Address  Phone   Notes  [facility] [address],  [phone] Also accepts self-pay patients.  [facility] [address], [place]  [phone]   Benjamin Perez, [address], [place] [phone]   [facility] [address], [place] [phone]   Lucianne Lei [address], [place]   [phone] Only accepts [place] Access [place] patients after they have their name applied to their card.   Self-Pay (no insurance) in [facility]:  Organization         Address  Phone   Notes  Sickle Cell Patients, [facility] [place] [phone]   [facility] [place] [phone]   [facility] [place]  [place], [place],  [phone]   [facility]/Dr. Osei-Bonsu  [address] [place] or [address], [place] [phone] Phone number for both [place] and [place] locations is the same.  [facility] and [facility] [address] [place] [phone]   [facility] [address] [place] or [address] [phone]   [facility] [address] [place] [phone], phone; [phone], fax Sees patients 1st and 3rd Saturday of every month.  Must not qualify for public or private insurance (i.e. Medicaid, Medicare, [place] Health Choice, Veterans' Benefits)  Household income should be no more than 200% of the poverty level The clinic cannot treat you if you are pregnant or think you are pregnant  Sexually transmitted diseases are not treated at the clinic.    Dental Care: Organization         Address  Phone  Notes  [facility] [address] [place] [phone] Accepts children up to age 86 who are enrolled in [place] or [place]; pregnant women with a Medicaid card; and children who have applied for Medicaid or [place] Health Choice, but were declined, whose parents can pay a reduced fee at time of service.  [facility] Department of [facility]  [address], [place] [phone] Accepts children up to age 88 who are enrolled in [place] or [place]; pregnant women with a Medicaid card; and children who have applied for Medicaid or [place] Health Choice, but were declined, whose parents can pay a reduced fee at time of service.  [place] Adult Dental Access PROGRAM  [place] [phone] Patients are seen by appointment only. Walk-ins are not accepted. [place] will see patients 61 years of age and older. Monday - Tuesday (8am-5pm) Most Wednesdays (8:30-5pm) $30 per visit, cash only  [facility] Adult Dental Access PROGRAM  [address], [facility] [phone] Patients are seen by appointment only. Walk-ins are not accepted. [place] will see patients 29 years of age and older. One Wednesday Evening (Monthly: Volunteer Based).  $30 per visit, cash only  [place]  [phone] for adults; Children under age 29, call Graduate Pediatric Dentistry at [phone]. Children aged 26-14, please call [phone] to request a pediatric application.  Dental services are provided in all areas of dental care including fillings, crowns and bridges, complete and partial dentures, implants, gum treatment, root canals, and extractions. Preventive care is also provided. Treatment is provided to both adults and children. Patients are selected via a lottery and there is often a waiting list.   [facility] [place]  [phone] [email or web address]   [facility] [address] [place], [place] [phone], Ext. [phone] Second and Fourth Thursday of each month, opens at 6:30 AM; Clinic ends at 9 AM.  Patients are seen on a first-come first-served basis, and a limited number are seen during each clinic.   [facility]  [address] [place]ard Danker [place], [place] [phone]   Eligibility Requirements You must have lived in [place], [place], or [place] counties for at least the last three months.   You cannot be eligible for state or federal sponsored [employer], including [employer], [place], or [employer].   You generally cannot be eligible for healthcare insurance through your employer.    How to apply: Eligibility screenings are held every Tuesday and Wednesday afternoon from 1:00 pm until 4:00 pm. You do not need an appointment for the interview!  [facility] [address], [place], [place]   [place]  [place] Department  [place]  [phone]    Behavioral Health Resources in the Community: Intensive Outpatient Programs Organization         Address  Phone  Notes  [place]. [address], [place], [place] [phone]   [facility] Outpatient [address] [place], [place]   ADS: Alcohol & Drug Svcs  [address], [place], [place]   [place] [address]  [place], [place] or [phone]   Substance Abuse Resources Organization  Address  Phone  Notes  Alcohol and Drug Services  [phone]   [place]  [phone]   [facility]  [phone]   Chinita Pester  [phone]   Residential & Outpatient Substance Abuse Program  [phone]   Psychological Services Organization         Address  Phone  Notes  [facility] [place]  [place]  [phone]   [place] [address], [place] or [phone]    Mobile Crisis Teams Organization         Address  Phone  Notes  [facility], Mobile Crisis Care Unit  [phone]   [facility] Psychotherapeutic [facility]  [address] [place], [place]   [place] [address], [place] [phone]    Self-Help/Support Groups Organization         Address  Phone             Notes  [facility] [employer] - variety of support groups  [place] Call for more information  Narcotics Anonymous (NA), [facility] [address], [employer] [place]  2 meetings at this location   Special educational needs teacher         Address  Phone  Notes  [facility] [place],    [place]  [phone]   [facility]  [address] [place] [id number], [place], [place]   [place], [place] [phone] Admissions: 8am-3pm M-F  Incentives Substance [place] [address],    [place], [place] [phone]   [facility] [address] [place], [place], [place]   [facility] [address]  [place], [place]   [facility] - Intensive Outpatient [address] Kristeen [address], [place], [place]   [facility] [place].,  [place], [place] [phone] or [phone]   [facility] (RTS) [address] [place], [place] Accepts Medicaid  [facility] [place] [address]  [place] [phone] Substance Abuse/Addiction Treatment   [facility] Organization         Address  Phone  Notes  [facility]  [phone]   Domenic Schwab, PhD [address] Arlis Porta [place], [place]   [phone] or [phone]   [place], [place] [phone]   [facility] [address], [place], [place] [phone] Insurance/Medicaid/sponsorship through [facility] and Families [address] [place]                                    [place], [place] [phone] [place] [address], [place] [phone]    Dr. Adele Schilder  [phone]   [facility] Dept. 1) [address] [place] 2) [place] 3)  [place] [address], [place] [phone]  [phone]   [place] [phone] or [phone] (After Hours)

## 2014-10-07 NOTE — ED Notes (Signed)
Pt from home c/o left sided chest pain that radiates through to the upper back. She reports  She has been experiencing increased shortness of breath and chest pain x 3 months. She reports swelling  In her lower extremities.

## 2015-06-13 ENCOUNTER — Encounter (HOSPITAL_COMMUNITY): Payer: Self-pay | Admitting: Emergency Medicine

## 2015-06-13 ENCOUNTER — Observation Stay (HOSPITAL_COMMUNITY)
Admission: EM | Admit: 2015-06-13 | Discharge: 2015-06-15 | Disposition: A | Discharge status: Home / Self Care | Payer: Self-pay | Attending: Internal Medicine | Admitting: Internal Medicine

## 2015-06-13 ENCOUNTER — Emergency Department (HOSPITAL_COMMUNITY): Payer: Self-pay

## 2015-06-13 DIAGNOSIS — Z6841 Body Mass Index (BMI) 40.0 and over, adult: Secondary | ICD-10-CM | POA: Insufficient documentation

## 2015-06-13 DIAGNOSIS — R079 Chest pain, unspecified: Secondary | ICD-10-CM | POA: Insufficient documentation

## 2015-06-13 DIAGNOSIS — N920 Excessive and frequent menstruation with regular cycle: Secondary | ICD-10-CM | POA: Diagnosis present

## 2015-06-13 DIAGNOSIS — D649 Anemia, unspecified: Secondary | ICD-10-CM | POA: Diagnosis present

## 2015-06-13 DIAGNOSIS — G43909 Migraine, unspecified, not intractable, without status migrainosus: Secondary | ICD-10-CM | POA: Insufficient documentation

## 2015-06-13 DIAGNOSIS — K219 Gastro-esophageal reflux disease without esophagitis: Secondary | ICD-10-CM | POA: Insufficient documentation

## 2015-06-13 DIAGNOSIS — G43709 Chronic migraine without aura, not intractable, without status migrainosus: Secondary | ICD-10-CM | POA: Insufficient documentation

## 2015-06-13 DIAGNOSIS — D500 Iron deficiency anemia secondary to blood loss (chronic): Principal | ICD-10-CM | POA: Insufficient documentation

## 2015-06-13 DIAGNOSIS — E669 Obesity, unspecified: Secondary | ICD-10-CM | POA: Insufficient documentation

## 2015-06-13 DIAGNOSIS — Z79899 Other long term (current) drug therapy: Secondary | ICD-10-CM | POA: Insufficient documentation

## 2015-06-13 NOTE — ED Provider Notes (Signed)
CSN: [id number]     Arrival date & time 06/13/15  2232 History  By signing my name below, I, Rowan Blase, attest that this documentation has been prepared under the direction and in the presence of Darrien Laakso, MD . Electronically Signed: Rowan Blase, Scribe. 06/13/2015. 2:25 AM.   Chief Complaint  Patient presents with  . Chest Pain    Patient is a 50 y.o. female presenting with chest pain. The history is provided by the patient. No language interpreter was used.  Chest Pain Pain location:  L chest Pain quality: not hot   Pain radiates to:  Does not radiate Pain radiates to the back: no   Pain severity:  Moderate Onset quality:  Gradual Timing:  Sporadic Progression:  Unchanged Chronicity:  New Context: movement   Context: not breathing and no trauma   Relieved by:  Nothing Worsened by:  Nothing tried Ineffective treatments:  None tried Associated symptoms: abdominal pain, headache and shortness of breath   Associated symptoms: no palpitations   Risk factors: no smoking    HPI Comments:  Donna [name] is a 50 y.o. female who presents to the Emergency Department complaining of left upper chest pain worsening a few minutes ago, occassionally stabbing. She notes chest pain and shortness of breath with exertion for the past 10 months with exertion. Pt reports associated chronic vaginal bleeding for 4 months, abdominal pain, and tingling in her head. She reports bleeding through 15 tampons per day. No alleviating factors noted. She has taken Tylenol occasionally without relief. She does not currently have a PCP and states her last doctors visit was 6 years ago. Pt denies h/o cardiac cath or stress test.  History reviewed. No pertinent past medical history. Past Surgical History  Procedure Laterality Date  . Back surgery    . Lumbar  fusions     History reviewed. No pertinent family history. Social History  Substance Use Topics  . Smoking status: Never Smoker   .  Smokeless tobacco: None  . Alcohol Use: No   OB History    No data available     Review of Systems  Respiratory: Positive for shortness of breath.   Cardiovascular: Positive for chest pain. Negative for palpitations and leg swelling.  Gastrointestinal: Positive for abdominal pain.  Genitourinary: Positive for vaginal bleeding.  Neurological: Positive for headaches.  All other systems reviewed and are negative.   Allergies  Celebrex and Oxycontin  Home Medications   Prior to Admission medications   Medication Sig Start Date End Date Taking? Authorizing Provider  acetaminophen (TYLENOL) 500 MG tablet Take 1,500 mg by mouth every 6 (six) hours as needed (for migraines). Headache   Yes Historical Provider, MD  Multiple Vitamins-Minerals (MULTIVITAMIN ADULT PO) Take 2 tablets by mouth daily.   Yes Historical Provider, MD  HYDROcodone-acetaminophen (NORCO/VICODIN) 5-325 MG per tablet Take 1 tablet by mouth every 4 (four) hours as needed for pain. Patient not taking: Reported on 10/07/2014 09/20/12   Ernst Spell, MD  hydrOXYzine (VISTARIL) 25 MG capsule Take 1 capsule (25 mg total) by mouth 3 (three) times daily as needed. Patient not taking: Reported on 06/13/2015 10/07/14   Junius Creamer, NP  metroNIDAZOLE (FLAGYL) 500 MG tablet Take 4 tablets (2,000 mg total) by mouth once. Patient not taking: Reported on 10/07/2014 10/13/12   Carlisle Cater, PA-C  promethazine (PHENERGAN) 12.5 MG tablet Take 1 tablet (12.5 mg total) by mouth every 6 (six) hours as needed for nausea. Patient not taking: Reported  on 10/07/2014 09/21/12   Tatyana Kirichenko, PA-C   BP 151/75 mmHg  Pulse 89  Temp(Src) 97.8 F (36.6 C) (Oral)  Resp 17  Ht 5\' 8"  (1.727 m)  Wt 298 lb (135.172 kg)  BMI 45.32 kg/m2  SpO2 97%  LMP 06/11/2015 Physical Exam  Constitutional: She is oriented to person, place, and time. She appears well-developed and well-nourished.  HENT:  Head: Normocephalic and atraumatic.  Mouth/Throat:  Oropharynx is clear and moist.  Moist mucous membranes  Eyes: EOM are normal. Pupils are equal, round, and reactive to light.  Neck: Normal range of motion. Neck supple.  Cardiovascular: Normal rate, regular rhythm, normal heart sounds and intact distal pulses.   Pulmonary/Chest: Effort normal and breath sounds normal. No respiratory distress. She has no wheezes. She has no rales.  Abdominal: Soft. Bowel sounds are normal. There is no tenderness. There is no rebound and no guarding.  Genitourinary: No vaginal discharge found.  No cervical or adnexal tenderness. Scant vaginal bleeding. 3 chaperones present.  Musculoskeletal: Normal range of motion. She exhibits no edema or tenderness.  Neurological: She is alert and oriented to person, place, and time. She has normal reflexes.  Skin: Skin is warm and dry. She is not diaphoretic.  Psychiatric: She has a normal mood and affect.  Nursing note and vitals reviewed.  ED Course  Procedures  DIAGNOSTIC STUDIES:  Oxygen Saturation is 99% on RA, normal by my interpretation.    COORDINATION OF CARE:  12:07 AM Will order CT angio chest and i-stat beta hCG. Will set up for pelvic exam. Discussed treatment plan with pt at bedside and pt agreed to plan.  Labs Review Labs Reviewed  BASIC METABOLIC PANEL - Abnormal; Notable for the following:    Sodium 146 (*)    Glucose, Bld 131 (*)    Calcium 8.5 (*)    All other components within normal limits  CBC - Abnormal; Notable for the following:    WBC 15.8 (*)    RBC 2.50 (*)    Hemoglobin 6.9 (*)    HCT 20.7 (*)    Platelets 570 (*)    All other components within normal limits  WET PREP, GENITAL  I-STAT TROPOININ, ED  I-STAT BETA HCG BLOOD, ED ([facility], [facility], [facility] ONLY)  GC/CHLAMYDIA PROBE AMP ([place]) NOT AT [facility]    Imaging Review Dg Chest 2 View  06/13/2015  CLINICAL DATA:  Shortness of breath and left-sided chest pain, intermittent for 10 months. EXAM: CHEST  2 VIEW COMPARISON:  10/07/2014  FINDINGS: Heart size is normal. Mediastinal shadows are normal. The lungs are clear. No effusions. Prominent epicardial fat. No bony abnormality. IMPRESSION: No active cardiopulmonary disease. Electronically Signed   By: Nelson Chimes M.D.   On: 06/13/2015 23:21   Ct Angio Chest Pe W/cm &/or Wo Cm  06/14/2015  CLINICAL DATA:  Left upper chest pain, shortness of breath with exertion for 10 months. White cell count 15.8. EXAM: CT ANGIOGRAPHY CHEST WITH CONTRAST TECHNIQUE: Multidetector CT imaging of the chest was performed using the standard protocol during bolus administration of intravenous contrast. Multiplanar CT image reconstructions and MIPs were obtained to evaluate the vascular anatomy. CONTRAST:  100 mL Isovue 370 COMPARISON:  None. FINDINGS: Examination is technically indeterminate for evaluation of pulmonary embolus due to poor contrast bolus. There is limited opacification of the pulmonary arteries. Presence or absence of pulmonary embolus cannot be confirmed. Normal heart size. Normal caliber thoracic aorta. Esophagus is decompressed. No significant lymphadenopathy in  the chest. Evaluation of lungs is somewhat limited due to motion artifact. There appears to be atelectasis in the lung bases. No focal consolidation or airspace disease. No pleural effusions. No pneumothorax. Airways are patent. Included portions of the upper abdominal organs are grossly unremarkable. No destructive bone lesions. Review of the MIP images confirms the above findings. IMPRESSION: Examination is technically indeterminate for evaluation of pulmonary arteries. No evidence of active pulmonary disease. Electronically Signed   By: Lucienne Capers M.D.   On: 06/14/2015 01:59   I have personally reviewed and evaluated these images and lab results as part of my medical decision-making.   EKG Interpretation   Date/Time:  Wednesday Roxie Gueye 19 2017 22:44:46 EDT Ventricular Rate:  100 PR Interval:  151 QRS Duration: 98 QT  Interval:  371 QTC Calculation: 478 R Axis:   -10 Text Interpretation:  Sinus tachycardia Confirmed by [facility]  MD,  Emmaline Kluver ([id number]) on 06/14/2015 12:14:19 AM      MDM   Final diagnoses:  None   Results for orders placed or performed during the hospital encounter of [id number]  Basic metabolic panel  Result Value Ref Range   Sodium 146 (H) 135 - 145 mmol/L   Potassium 3.6 3.5 - 5.1 mmol/L   Chloride 109 101 - 111 mmol/L   CO2 24 22 - 32 mmol/L   Glucose, Bld 131 (H) 65 - 99 mg/dL   BUN 11 6 - 20 mg/dL   Creatinine, Ser 0.70 0.44 - 1.00 mg/dL   Calcium 8.5 (L) 8.9 - 10.3 mg/dL   GFR calc non Af Amer >60 >60 mL/min   GFR calc Af Amer >60 >60 mL/min   Anion gap 13 5 - 15  CBC  Result Value Ref Range   WBC 15.8 (H) 4.0 - 10.5 K/uL   RBC 2.50 (L) 3.87 - 5.11 MIL/uL   Hemoglobin 6.9 (LL) 12.0 - 15.0 g/dL   HCT 20.7 (L) 36.0 - 46.0 %   MCV 82.8 78.0 - 100.0 fL   MCH 27.6 26.0 - 34.0 pg   MCHC 33.3 30.0 - 36.0 g/dL   RDW 13.7 11.5 - 15.5 %   Platelets 570 (H) 150 - 400 K/uL  I-stat troponin, ED  Result Value Ref Range   Troponin i, poc 0.00 0.00 - 0.08 ng/mL   Comment 3          I-Stat Beta hCG blood, ED ([facility], [facility], [facility] only)  Result Value Ref Range   I-stat hCG, quantitative <5.0 <5 mIU/mL   Comment 3           Dg Chest 2 View  06/13/2015  CLINICAL DATA:  Shortness of breath and left-sided chest pain, intermittent for 10 months. EXAM: CHEST  2 VIEW COMPARISON:  10/07/2014 FINDINGS: Heart size is normal. Mediastinal shadows are normal. The lungs are clear. No effusions. Prominent epicardial fat. No bony abnormality. IMPRESSION: No active cardiopulmonary disease. Electronically Signed   By: Nelson Chimes M.D.   On: 06/13/2015 23:21   Ct Angio Chest Pe W/cm &/or Wo Cm  06/14/2015  CLINICAL DATA:  Left upper chest pain, shortness of breath with exertion for 10 months. White cell count 15.8. EXAM: CT ANGIOGRAPHY CHEST WITH CONTRAST TECHNIQUE: Multidetector CT imaging of the chest  was performed using the standard protocol during bolus administration of intravenous contrast. Multiplanar CT image reconstructions and MIPs were obtained to evaluate the vascular anatomy. CONTRAST:  100 mL Isovue 370 COMPARISON:  None. FINDINGS: Examination is technically indeterminate for  evaluation of pulmonary embolus due to poor contrast bolus. There is limited opacification of the pulmonary arteries. Presence or absence of pulmonary embolus cannot be confirmed. Normal heart size. Normal caliber thoracic aorta. Esophagus is decompressed. No significant lymphadenopathy in the chest. Evaluation of lungs is somewhat limited due to motion artifact. There appears to be atelectasis in the lung bases. No focal consolidation or airspace disease. No pleural effusions. No pneumothorax. Airways are patent. Included portions of the upper abdominal organs are grossly unremarkable. No destructive bone lesions. Review of the MIP images confirms the above findings. IMPRESSION: Examination is technically indeterminate for evaluation of pulmonary arteries. No evidence of active pulmonary disease. Electronically Signed   By: Lucienne Capers M.D.   On: 06/14/2015 01:59    Filed Vitals:   06/13/15 2249 06/14/15 0107  BP: 140/90 151/75  Pulse: 99 89  Temp: 97.8 F (36.6 C)   Resp: 20 17    Will admit for symptomatic anemia due to CP and SOB   I personally performed the services described in this documentation, which was scribed in my presence. The recorded information has been reviewed and is accurate.      Veatrice Kells, MD 06/14/15 [id number]

## 2015-06-13 NOTE — ED Notes (Signed)
Pt c/o L sided CP, nonradiating x 2 months. SHOB onset last August, worse with exertion. Pt c/o vaginal bleeding x 4 month and back spasms.

## 2015-06-14 ENCOUNTER — Observation Stay (HOSPITAL_COMMUNITY): Payer: Self-pay

## 2015-06-14 ENCOUNTER — Encounter (HOSPITAL_COMMUNITY): Payer: Self-pay | Admitting: Emergency Medicine

## 2015-06-14 ENCOUNTER — Emergency Department (HOSPITAL_COMMUNITY): Payer: Self-pay

## 2015-06-14 DIAGNOSIS — N920 Excessive and frequent menstruation with regular cycle: Secondary | ICD-10-CM | POA: Diagnosis present

## 2015-06-14 DIAGNOSIS — N921 Excessive and frequent menstruation with irregular cycle: Secondary | ICD-10-CM

## 2015-06-14 DIAGNOSIS — D649 Anemia, unspecified: Secondary | ICD-10-CM | POA: Diagnosis present

## 2015-06-14 LAB — CBC
HCT: 20.7 % — ABNORMAL LOW (ref 36.0–46.0)
HCT: 43.1 % (ref 36.0–46.0)
Hemoglobin: 14.8 g/dL (ref 12.0–15.0)
Hemoglobin: 6.9 g/dL — CL (ref 12.0–15.0)
MCH: 27.6 pg (ref 26.0–34.0)
MCH: 28.2 pg (ref 26.0–34.0)
MCHC: 33.3 g/dL (ref 30.0–36.0)
MCHC: 34.3 g/dL (ref 30.0–36.0)
MCV: 82.1 fL (ref 78.0–100.0)
MCV: 82.8 fL (ref 78.0–100.0)
Platelets: 360 10*3/uL (ref 150–400)
Platelets: 570 10*3/uL — ABNORMAL HIGH (ref 150–400)
RBC: 2.5 MIL/uL — ABNORMAL LOW (ref 3.87–5.11)
RBC: 5.25 MIL/uL — ABNORMAL HIGH (ref 3.87–5.11)
RDW: 13.7 % (ref 11.5–15.5)
RDW: 13.7 % (ref 11.5–15.5)
WBC: 15.8 10*3/uL — ABNORMAL HIGH (ref 4.0–10.5)
WBC: 8.7 10*3/uL (ref 4.0–10.5)

## 2015-06-14 LAB — BASIC METABOLIC PANEL
Anion gap: 13 (ref 5–15)
BUN: 11 mg/dL (ref 6–20)
CO2: 24 mmol/L (ref 22–32)
Calcium: 8.5 mg/dL — ABNORMAL LOW (ref 8.9–10.3)
Chloride: 109 mmol/L (ref 101–111)
Creatinine, Ser: 0.7 mg/dL (ref 0.44–1.00)
GFR calc Af Amer: >60 mL/min (ref >60)
GFR calc non Af Amer: >60 mL/min (ref >60)
Glucose, Bld: 131 mg/dL — ABNORMAL HIGH (ref 65–99)
Potassium: 3.6 mmol/L (ref 3.5–5.1)
Sodium: 146 mmol/L — ABNORMAL HIGH (ref 135–145)

## 2015-06-14 LAB — GC/CHLAMYDIA PROBE AMP (~~LOC~~) NOT AT ARMC
Chlamydia: NEGATIVE
Neisseria Gonorrhea: NEGATIVE

## 2015-06-14 LAB — PREPARE RBC (CROSSMATCH)

## 2015-06-14 LAB — RETICULOCYTES
RBC.: 4.8 MIL/uL (ref 3.87–5.11)
Retic Count, Absolute: 72 10*3/uL (ref 19.0–186.0)
Retic Ct Pct: 1.5 % (ref 0.4–3.1)

## 2015-06-14 LAB — IRON AND TIBC
Iron: 23 ug/dL — ABNORMAL LOW (ref 28–170)
Saturation Ratios: 7 % — ABNORMAL LOW (ref 10.4–31.8)
TIBC: 343 ug/dL (ref 250–450)
UIBC: 320 ug/dL

## 2015-06-14 LAB — WET PREP, GENITAL
Clue Cells Wet Prep HPF POC: NONE SEEN
Sperm: NONE SEEN
Trich, Wet Prep: NONE SEEN
Yeast Wet Prep HPF POC: NONE SEEN

## 2015-06-14 LAB — ABO/RH: ABO/RH(D): O POS

## 2015-06-14 LAB — I-STAT BETA HCG BLOOD, ED (MC, WL, AP ONLY): I-stat hCG, quantitative: <5 m[IU]/mL (ref <5)

## 2015-06-14 LAB — VITAMIN B12: Vitamin B-12: 272 pg/mL (ref 180–914)

## 2015-06-14 LAB — I-STAT TROPONIN, ED: Troponin i, poc: 0 ng/mL (ref 0.00–0.08)

## 2015-06-14 LAB — FOLATE: Folate: 14.9 ng/mL (ref >5.9)

## 2015-06-14 LAB — FERRITIN: Ferritin: 31 ng/mL (ref 11–307)

## 2015-06-14 MED ORDER — IOPAMIDOL (ISOVUE-370) INJECTION 76%
100.0000 mL | Freq: Once | INTRAVENOUS | Status: AC | PRN
  Administered 2015-06-14: 100 mL via INTRAVENOUS

## 2015-06-14 MED ORDER — CHLORHEXIDINE GLUCONATE 0.12 % MT SOLN
15.0000 mL | Freq: Two times a day (BID) | OROMUCOSAL | Status: DC
  Administered 2015-06-14 – 2015-06-15 (×3): 15 mL via OROMUCOSAL
  Filled 2015-06-14 (×3): qty 15

## 2015-06-14 MED ORDER — SODIUM CHLORIDE 0.9 % IV BOLUS (SEPSIS)
500.0000 mL | Freq: Once | INTRAVENOUS | Status: AC
  Administered 2015-06-14: 500 mL via INTRAVENOUS

## 2015-06-14 MED ORDER — SODIUM CHLORIDE 0.9 % IV SOLN
Freq: Once | INTRAVENOUS | Status: AC
  Administered 2015-06-14: 04:00:00 via INTRAVENOUS

## 2015-06-14 MED ORDER — MEGESTROL ACETATE 40 MG PO TABS
40.0000 mg | ORAL_TABLET | Freq: Two times a day (BID) | ORAL | Status: DC
  Administered 2015-06-14 – 2015-06-15 (×3): 40 mg via ORAL
  Filled 2015-06-14 (×3): qty 1

## 2015-06-14 MED ORDER — ONDANSETRON HCL 4 MG/2ML IJ SOLN
4.0000 mg | Freq: Four times a day (QID) | INTRAMUSCULAR | Status: DC | PRN
  Administered 2015-06-14: 4 mg via INTRAVENOUS
  Filled 2015-06-14 (×2): qty 2

## 2015-06-14 MED ORDER — POLYSACCHARIDE IRON COMPLEX 150 MG PO CAPS
150.0000 mg | ORAL_CAPSULE | Freq: Two times a day (BID) | ORAL | Status: DC
  Administered 2015-06-14 – 2015-06-15 (×3): 150 mg via ORAL
  Filled 2015-06-14 (×4): qty 1

## 2015-06-14 MED ORDER — HYDROCODONE-ACETAMINOPHEN 5-325 MG PO TABS
1.0000 | ORAL_TABLET | Freq: Four times a day (QID) | ORAL | Status: DC | PRN
  Administered 2015-06-14 – 2015-06-15 (×4): 1 via ORAL
  Filled 2015-06-14 (×5): qty 1

## 2015-06-14 MED ORDER — ACETAMINOPHEN 500 MG PO TABS
1000.0000 mg | ORAL_TABLET | Freq: Four times a day (QID) | ORAL | Status: DC | PRN
  Administered 2015-06-14: 1000 mg via ORAL
  Filled 2015-06-14: qty 2

## 2015-06-14 MED ORDER — CETYLPYRIDINIUM CHLORIDE 0.05 % MT LIQD: 7.0000 mL | Freq: Two times a day (BID) | OROMUCOSAL | Status: DC

## 2015-06-14 MED ORDER — BUTALBITAL-APAP-CAFFEINE 50-325-40 MG PO TABS
1.0000 | ORAL_TABLET | Freq: Once | ORAL | Status: AC
  Administered 2015-06-14: 1 via ORAL
  Filled 2015-06-14: qty 1

## 2015-06-14 NOTE — Care Management Note (Signed)
Case Management Note  Patient Details  Name: Donna [name] MRN: [id number] Date of Birth: [birth year]  Subjective/Objective: 50 y/o f admitted w/symptomatic anemia. From home.No insurance.Will provide resources.                   Action/Plan:d/c plan home.   Expected Discharge Date:                  Expected Discharge Plan:  Home/Self Care  In-House Referral:     Discharge planning Services  CM Consult  Post Acute Care Choice:    Choice offered to:     DME Arranged:    DME Agency:     HH Arranged:    HH Agency:     Status of Service:  In process, will continue to follow  Medicare Important Message Given:    Date Medicare IM Given:    Medicare IM give by:    Date Additional Medicare IM Given:    Additional Medicare Important Message give by:     If discussed at [place] of Stay Meetings, dates discussed:    Additional Comments:  Dessa Phi, RN 06/14/2015, 12:31 PM

## 2015-06-14 NOTE — H&P (Signed)
[facility] History and Physical  Donna [name] [id number] DOB: February 11, 1966 DOA: 06/13/2015  Referring physician: EDP PCP: No PCP Per Patient   Chief Complaint: Chest pain   HPI: Donna [name] is a 50 y.o. female who presents to the ED with CP and SOB.  Patient has been having 4 month history of ongoing chronic vaginal bleeding.  She has 10 month history of DOE.  She currently bleeds through 15 tampons a day.  No h/o stress test or cardiac cath.  In ED patient is profoundly anemic with HGB 6.9 this is down from baseline of 12.6 back in august of last year.  Review of Systems: Systems reviewed.  As above, otherwise negative  History reviewed. No pertinent past medical history. Past Surgical History  Procedure Laterality Date  . Back surgery    . Lumbar  fusions     Social History:  reports that she has never smoked. She does not have any smokeless tobacco history on file. She reports that she does not drink alcohol or use illicit drugs.  Allergies  Allergen Reactions  . Celebrex [Celecoxib] Other (See Comments)    Facial edema   . Oxycontin [Oxycodone Hcl] Other (See Comments)    Hallucinations.  Has taken morphine and other pain meds and is fine.    History reviewed. No pertinent family history.   Prior to Admission medications   Medication Sig Start Date End Date Taking? Authorizing Provider  acetaminophen (TYLENOL) 500 MG tablet Take 1,500 mg by mouth every 6 (six) hours as needed (for migraines). Headache   Yes Historical Provider, MD  Multiple Vitamins-Minerals (MULTIVITAMIN ADULT PO) Take 2 tablets by mouth daily.   Yes Historical Provider, MD   Physical Exam: Filed Vitals:   06/14/15 0200 06/14/15 0254  BP: 126/70 126/70  Pulse: 96 100  Temp:    Resp: 20 20    BP 126/70 mmHg  Pulse 100  Temp(Src) 97.8 F (36.6 C) (Oral)  Resp 20  Ht 5\' 8"  (1.727 m)  Wt 135.172 kg (298 lb)  BMI 45.32 kg/m2  SpO2 100%  LMP 06/11/2015  General Appearance:    Alert,  oriented, no distress, appears stated age  Head:    Normocephalic, atraumatic  Eyes:    PERRL, EOMI, sclera non-icteric        Nose:   Nares without drainage or epistaxis. Mucosa, turbinates normal  Throat:   Moist mucous membranes. Oropharynx without erythema or exudate.  Neck:   Supple. No carotid bruits.  No thyromegaly.  No lymphadenopathy.   Back:     No CVA tenderness, no spinal tenderness  Lungs:     Clear to auscultation bilaterally, without wheezes, rhonchi or rales  Chest wall:    No tenderness to palpitation  Heart:    Regular rate and rhythm without murmurs, gallops, rubs  Abdomen:     Soft, non-tender, nondistended, normal bowel sounds, no organomegaly  Genitalia:    deferred  Rectal:    deferred  Extremities:   No clubbing, cyanosis or edema.  Pulses:   2+ and symmetric all extremities  Skin:   Skin color, texture, turgor normal, no rashes or lesions  Lymph nodes:   Cervical, supraclavicular, and axillary nodes normal  Neurologic:   CNII-XII intact. Normal strength, sensation and reflexes      throughout    Labs on Admission:  Basic Metabolic Panel:  Recent Labs Lab 06/14/15 0007  NA 146*  K 3.6  CL 109  CO2 24  GLUCOSE 131*  BUN 11  CREATININE 0.70  CALCIUM 8.5*   Liver Function Tests: No results for input(s): AST, ALT, ALKPHOS, BILITOT, PROT, ALBUMIN in the last 168 hours. No results for input(s): LIPASE, AMYLASE in the last 168 hours. No results for input(s): AMMONIA in the last 168 hours. CBC:  Recent Labs Lab 06/14/15 0007  WBC 15.8*  HGB 6.9*  HCT 20.7*  MCV 82.8  PLT 570*   Cardiac Enzymes: No results for input(s): CKTOTAL, CKMB, CKMBINDEX, TROPONINI in the last 168 hours.  BNP (last 3 results) No results for input(s): PROBNP in the last 8760 hours. CBG: No results for input(s): GLUCAP in the last 168 hours.  Radiological Exams on Admission: Dg Chest 2 View  06/13/2015  CLINICAL DATA:  Shortness of breath and left-sided chest pain,  intermittent for 10 months. EXAM: CHEST  2 VIEW COMPARISON:  10/07/2014 FINDINGS: Heart size is normal. Mediastinal shadows are normal. The lungs are clear. No effusions. Prominent epicardial fat. No bony abnormality. IMPRESSION: No active cardiopulmonary disease. Electronically Signed   By: Nelson Chimes M.D.   On: 06/13/2015 23:21   Ct Angio Chest Pe W/cm &/or Wo Cm  06/14/2015  CLINICAL DATA:  Left upper chest pain, shortness of breath with exertion for 10 months. White cell count 15.8. EXAM: CT ANGIOGRAPHY CHEST WITH CONTRAST TECHNIQUE: Multidetector CT imaging of the chest was performed using the standard protocol during bolus administration of intravenous contrast. Multiplanar CT image reconstructions and MIPs were obtained to evaluate the vascular anatomy. CONTRAST:  100 mL Isovue 370 COMPARISON:  None. FINDINGS: Examination is technically indeterminate for evaluation of pulmonary embolus due to poor contrast bolus. There is limited opacification of the pulmonary arteries. Presence or absence of pulmonary embolus cannot be confirmed. Normal heart size. Normal caliber thoracic aorta. Esophagus is decompressed. No significant lymphadenopathy in the chest. Evaluation of lungs is somewhat limited due to motion artifact. There appears to be atelectasis in the lung bases. No focal consolidation or airspace disease. No pleural effusions. No pneumothorax. Airways are patent. Included portions of the upper abdominal organs are grossly unremarkable. No destructive bone lesions. Review of the MIP images confirms the above findings. IMPRESSION: Examination is technically indeterminate for evaluation of pulmonary arteries. No evidence of active pulmonary disease. Electronically Signed   By: Lucienne Capers M.D.   On: 06/14/2015 01:59    EKG: Independently reviewed.  Assessment/Plan Principal Problem:   Symptomatic anemia Active Problems:   Menorrhagia   1. Symptomatic anemia - 1. Tele  monitor 2. Transfusing 2 units PRBC 3. H/H post transfusion 2. Menorrhagia - 1. Transvaginal US 2. Consider GYN consult prior to DC if [place] is negative for any abnormalities.  They may want [place] to put patient on OCP to try and treat. 3. Definitely needs GYN follow up.   Code Status: Full  Family Communication: No family in room Disposition Plan: Admit to obs   Time spent: 50 min  Rital Cavey M. [facility] Pager [phone]  If 7AM-7PM, please contact the day team taking care of the patient [email or web address] Password [facility] 06/14/2015, 3:46 AM

## 2015-06-14 NOTE — Care Management Note (Signed)
Case Management Note  Patient Details  Name: Donna [name] MRN: [id number] Date of Birth: Jul 24, 1965  Subjective/Objective:     50 y/o f admitted w/symptomatic anemia. From home. No insurance-provided w/health insurance info, pcp listing, [employer] [facility].               Action/Plan:d/c plan home.   Expected Discharge Date:                  Expected Discharge Plan:  Home/Self Care  In-House Referral:     Discharge planning Services  CM Consult  Post Acute Care Choice:    Choice offered to:     DME Arranged:    DME Agency:     HH Arranged:    HH Agency:     Status of Service:  In process, will continue to follow  Medicare Important Message Given:    Date Medicare IM Given:    Medicare IM give by:    Date Additional Medicare IM Given:    Additional Medicare Important Message give by:     If discussed at [place] of Stay Meetings, dates discussed:    Additional Comments:  Dessa Phi, RN 06/14/2015, 1:46 PM

## 2015-06-14 NOTE — Progress Notes (Addendum)
Patient seen and examined. Admitted after midnight secondary to symptomatic anemia. Patient with vaginal bleeding/menorrhagia; on going for 3-4 months now. No CP; but reports dyspnea on exertion and fatigue. Hgb on admission 6.9 Please refer to H&P written by Dr. Alcario Drought for further info/details on admission.  Plan: -will transfuse as needed -start patient on niferex -follow transvaginal/pelvic US -GYN consulted (Dr. Elly Modena); recommended to start patient on Megace 40mg  BID and the office will arrange follow up in 2 weeks.   Barton Dubois [phone]

## 2015-06-15 DIAGNOSIS — G43709 Chronic migraine without aura, not intractable, without status migrainosus: Secondary | ICD-10-CM | POA: Insufficient documentation

## 2015-06-15 DIAGNOSIS — K219 Gastro-esophageal reflux disease without esophagitis: Secondary | ICD-10-CM

## 2015-06-15 LAB — CBC
HCT: 44.7 % (ref 36.0–46.0)
Hemoglobin: 15.3 g/dL — ABNORMAL HIGH (ref 12.0–15.0)
MCH: 28.7 pg (ref 26.0–34.0)
MCHC: 34.2 g/dL (ref 30.0–36.0)
MCV: 83.7 fL (ref 78.0–100.0)
Platelets: 345 10*3/uL (ref 150–400)
RBC: 5.34 MIL/uL — ABNORMAL HIGH (ref 3.87–5.11)
RDW: 13.7 % (ref 11.5–15.5)
WBC: 9.1 10*3/uL (ref 4.0–10.5)

## 2015-06-15 LAB — BASIC METABOLIC PANEL
Anion gap: 6 (ref 5–15)
BUN: 10 mg/dL (ref 6–20)
CO2: 26 mmol/L (ref 22–32)
Calcium: 9 mg/dL (ref 8.9–10.3)
Chloride: 107 mmol/L (ref 101–111)
Creatinine, Ser: 0.5 mg/dL (ref 0.44–1.00)
GFR calc Af Amer: >60 mL/min (ref >60)
GFR calc non Af Amer: >60 mL/min (ref >60)
Glucose, Bld: 103 mg/dL — ABNORMAL HIGH (ref 65–99)
Potassium: 4.2 mmol/L (ref 3.5–5.1)
Sodium: 139 mmol/L (ref 135–145)

## 2015-06-15 LAB — TYPE AND SCREEN
ABO/RH(D): O POS
Antibody Screen: NEGATIVE
Unit division: 0
Unit division: 0

## 2015-06-15 MED ORDER — MEGESTROL ACETATE 40 MG PO TABS: 40.0000 mg | ORAL_TABLET | Freq: Two times a day (BID) | ORAL | Status: DC

## 2015-06-15 MED ORDER — ACETAMINOPHEN 500 MG PO TABS: 1000.0000 mg | ORAL_TABLET | Freq: Four times a day (QID) | ORAL | Status: DC | PRN

## 2015-06-15 MED ORDER — PANTOPRAZOLE SODIUM 40 MG PO TBEC: 40.0000 mg | DELAYED_RELEASE_TABLET | Freq: Every day | ORAL | Status: DC

## 2015-06-15 MED ORDER — TOPIRAMATE 50 MG PO TABS: 50.0000 mg | ORAL_TABLET | Freq: Every day | ORAL | Status: DC

## 2015-06-15 MED ORDER — POLYSACCHARIDE IRON COMPLEX 150 MG PO CAPS: 150.0000 mg | ORAL_CAPSULE | Freq: Every day | ORAL | Status: DC

## 2015-06-15 MED FILL — MEGESTROL 40 MG TABLET: 40 | 30 days supply | Qty: 60 | Fill #0

## 2015-06-15 MED FILL — ?PANTOPRAZOLE SOD DR 40MG: 40 MG | 30 days supply | Qty: 30 | Fill #0

## 2015-06-15 MED FILL — TOPIRAMATE 50 MG TABLET: 50 | 30 days supply | Qty: 30 | Fill #0

## 2015-06-15 NOTE — Hospital Discharge Follow-Up (Signed)
[facility] [place]:  This Case Manager received communication from Dessa Phi, RN CM that patient uninsured and does not have a PCP. Patient needing hospital follow-up appointment. Hospital follow-up appointment scheduled for 06/25/15 at 1200 with Dr. Janne Napoleon.  AVS updated. Dessa Phi, RN CM also updated.

## 2015-06-15 NOTE — Care Management Note (Signed)
Case Management Note  Patient Details  Name: Donna [name] MRN: [id number] Date of Birth: 23-Nov-1965  Subjective/Objective:  Set up pcp [facility] appt-see d/c follow section-06/25/15-Patient informed-can go there also for meds-voiced understanding.                 Action/Plan:d/c home.   Expected Discharge Date:                  Expected Discharge Plan:  Home/Self Care  In-House Referral:     Discharge planning Services  CM Consult, [facility]  Post Acute Care Choice:    Choice offered to:     DME Arranged:    DME Agency:     HH Arranged:    [place] Agency:     Status of Service:  Completed, signed off  Medicare Important Message Given:    Date Medicare IM Given:    Medicare IM give by:    Date Additional Medicare IM Given:    Additional Medicare Important Message give by:     If discussed at [place] of Stay Meetings, dates discussed:    Additional Comments:  Dessa Phi, RN 06/15/2015, 12:28 PM

## 2015-06-15 NOTE — Discharge Summary (Signed)
Physician Discharge Summary  Donna [name] [id number] DOB: 12-04-1965 DOA: 06/13/2015  PCP: No PCP Per Patient  Admit date: 06/13/2015 Discharge date: 06/15/2015  Time spent: 35 minutes  Recommendations for Outpatient Follow-up:  1. Repeat CBC to follow Hgb trend 2. Follow migraines symptoms and adjust preventive medication dose as needed  3. Please address/assist with preventive screening  4. Ensure follow up with GYN as previously instructed    Discharge Diagnoses:  Principal Problem:   Symptomatic anemia Active Problems:   Menorrhagia Obesity GERD Migraines  Discharge Condition: stable and improved. Discharge home with instructions to follow up with GYN service in approx 10 days. Will also set up follow up at [facility] clinic to establish care and provide assistance with prevention screening and further care.  Diet recommendation: low calorie diet  Filed Weights   06/13/15 2249 06/14/15 0446  Weight: 135.172 kg (298 lb) 143.201 kg (315 lb 11.2 oz)    History of present illness:  As per H&P written by Dr. Alcario Drought on 06/14/15 50 y.o. female who presents to the ED with CP and SOB. Patient has been having 4 month history of ongoing chronic vaginal bleeding. She has 10 month history of DOE. She currently bleeds through 15 tampons a day. No h/o stress test or cardiac cath.  In ED patient is profoundly anemic with HGB 6.9 this is down from baseline of 12.6 back in august of last year.  Hospital Course:  1-symptomatic anemia: due to menorrhagia/vaginal bleeding -patient pelvic and vaginal [place] w/o major abnormality appreciated -has never received any screening before -transfuse 2 units of PRBC's and discharge on niferex daily -case discussed with GYN service and recommending starting patient on Megace BID and they will arrange follow up in the next 10 days -at discharge bleeding slowing down/resolving   2-obesity: -Body mass index is 47.3 kg/(m^2). -advise to follow low  calorie diet and to increase exercise  3-hx of migraines: -continue PRN tylenol -discharge on topamax  4-GERD: -started on PPI   Procedures:  See below for x-ray reports   Consultations:  Gynecology service (Dr. Elly Modena)  Discharge Exam: Filed Vitals:   06/14/15 2241 06/15/15 0531  BP: 153/76 136/79  Pulse: 81 84  Temp: 97.6 F (36.4 C) 98.2 F (36.8 C)  Resp: 16 18    General: afebrile, still feeling tire, but denies CP and SOB. Patient endorses intermittent chronic migraines. Cardiovascular: S1 and S2, no rubs or gallops Respiratory: CTA bilaterally Abdomen: soft, NT, NS, obese and with positive BS  Discharge Instructions   Discharge Instructions    Discharge instructions    Complete by:  As directed   Take medications as prescribed Please follow up with gynecologist as instructed Keep yourself well hydrated Avoid excessive use of NSAID's Follow low calorie diet          Current Discharge Medication List    START taking these medications   Details  iron polysaccharides (NIFEREX) 150 MG capsule Take 1 capsule (150 mg total) by mouth daily. Qty: 30 capsule, Refills: 0    megestrol (MEGACE) 40 MG tablet Take 1 tablet (40 mg total) by mouth 2 (two) times daily. Qty: 60 tablet, Refills: 0    pantoprazole (PROTONIX) 40 MG tablet Take 1 tablet (40 mg total) by mouth daily. Qty: 30 tablet, Refills: 1    topiramate (TOPAMAX) 50 MG tablet Take 1 tablet (50 mg total) by mouth at bedtime. Qty: 30 tablet, Refills: 1      CONTINUE these medications which  have CHANGED   Details  acetaminophen (TYLENOL) 500 MG tablet Take 2 tablets (1,000 mg total) by mouth every 6 (six) hours as needed (for migraines). Headache Qty: 30 tablet, Refills: 0      CONTINUE these medications which have NOT CHANGED   Details  Multiple Vitamins-Minerals (MULTIVITAMIN ADULT PO) Take 2 tablets by mouth daily.       Allergies  Allergen Reactions  . Celebrex [Celecoxib] Other  (See Comments)    Facial edema   . Oxycontin [Oxycodone Hcl] Other (See Comments)    Hallucinations.  Has taken morphine and other pain meds and is fine.   Follow-up Information    Follow up with CONSTANT,PEGGY, MD.   Specialty:  Obstetrics and Gynecology   Why:  office will contact you with appointment details    Contact information:   [place] [address] [phone]       The results of significant diagnostics from this hospitalization (including imaging, microbiology, ancillary and laboratory) are listed below for reference.    Significant Diagnostic Studies: Dg Chest 2 View  06/13/2015  CLINICAL DATA:  Shortness of breath and left-sided chest pain, intermittent for 10 months. EXAM: CHEST  2 VIEW COMPARISON:  10/07/2014 FINDINGS: Heart size is normal. Mediastinal shadows are normal. The lungs are clear. No effusions. Prominent epicardial fat. No bony abnormality. IMPRESSION: No active cardiopulmonary disease. Electronically Signed   By: Nelson Chimes M.D.   On: 06/13/2015 23:21   Ct Angio Chest Pe W/cm &/or Wo Cm  06/14/2015  CLINICAL DATA:  Left upper chest pain, shortness of breath with exertion for 10 months. White cell count 15.8. EXAM: CT ANGIOGRAPHY CHEST WITH CONTRAST TECHNIQUE: Multidetector CT imaging of the chest was performed using the standard protocol during bolus administration of intravenous contrast. Multiplanar CT image reconstructions and MIPs were obtained to evaluate the vascular anatomy. CONTRAST:  100 mL Isovue 370 COMPARISON:  None. FINDINGS: Examination is technically indeterminate for evaluation of pulmonary embolus due to poor contrast bolus. There is limited opacification of the pulmonary arteries. Presence or absence of pulmonary embolus cannot be confirmed. Normal heart size. Normal caliber thoracic aorta. Esophagus is decompressed. No significant lymphadenopathy in the chest. Evaluation of lungs is somewhat limited due to motion artifact.  There appears to be atelectasis in the lung bases. No focal consolidation or airspace disease. No pleural effusions. No pneumothorax. Airways are patent. Included portions of the upper abdominal organs are grossly unremarkable. No destructive bone lesions. Review of the MIP images confirms the above findings. IMPRESSION: Examination is technically indeterminate for evaluation of pulmonary arteries. No evidence of active pulmonary disease. Electronically Signed   By: Lucienne Capers M.D.   On: 06/14/2015 01:59   US Transvaginal Non-ob  06/14/2015  CLINICAL DATA:  Vaginal bleeding and cramping EXAM: TRANSABDOMINAL AND TRANSVAGINAL ULTRASOUND OF PELVIS TECHNIQUE: Both transabdominal and transvaginal ultrasound examinations of the pelvis were performed. Transabdominal technique was performed for global imaging of the pelvis including uterus, ovaries, adnexal regions, and pelvic cul-de-sac. It was necessary to proceed with endovaginal exam following the transabdominal exam to visualize the endometrium and adnexae and better detail. COMPARISON:  None available FINDINGS: Uterus Measurements: 9.3 x 4.8 x 4.4 cm. Several nabothian cyst noted. Mild heterogeneous myometrium echotexture. No definite focal abnormality. Endometrium Thickness: 6 mm.  No focal abnormality visualized. Right ovary Measurements: 3.4 x 2.5 x 2.5 cm. Normal appearance/no adnexal mass. Left ovary Not visualized, obscured by bowel gas. Other findings No abnormal free fluid.  IMPRESSION: No acute finding by pelvic ultrasound. Study limited by body habitus and bowel gas. Nonvisualization of the left ovary No free fluid. Electronically Signed   By: Jerilynn Mages.  Shick M.D.   On: 06/14/2015 09:24   US Pelvis Complete  06/14/2015  CLINICAL DATA:  Vaginal bleeding and cramping EXAM: TRANSABDOMINAL AND TRANSVAGINAL ULTRASOUND OF PELVIS TECHNIQUE: Both transabdominal and transvaginal ultrasound examinations of the pelvis were performed. Transabdominal technique was  performed for global imaging of the pelvis including uterus, ovaries, adnexal regions, and pelvic cul-de-sac. It was necessary to proceed with endovaginal exam following the transabdominal exam to visualize the endometrium and adnexae and better detail. COMPARISON:  None available FINDINGS: Uterus Measurements: 9.3 x 4.8 x 4.4 cm. Several nabothian cyst noted. Mild heterogeneous myometrium echotexture. No definite focal abnormality. Endometrium Thickness: 6 mm.  No focal abnormality visualized. Right ovary Measurements: 3.4 x 2.5 x 2.5 cm. Normal appearance/no adnexal mass. Left ovary Not visualized, obscured by bowel gas. Other findings No abnormal free fluid. IMPRESSION: No acute finding by pelvic ultrasound. Study limited by body habitus and bowel gas. Nonvisualization of the left ovary No free fluid. Electronically Signed   By: Jerilynn Mages.  Shick M.D.   On: 06/14/2015 09:24    Microbiology: Recent Results (from the past 240 hour(s))  Wet prep, genital     Status: Abnormal   Collection Time: 06/14/15  2:26 AM  Result Value Ref Range Status   Yeast Wet Prep HPF POC NONE SEEN NONE SEEN Final   Trich, Wet Prep NONE SEEN NONE SEEN Final   Clue Cells Wet Prep HPF POC NONE SEEN NONE SEEN Final   WBC, Wet Prep HPF POC FEW (A) NONE SEEN Final   Sperm NONE SEEN  Final     Labs: Basic Metabolic Panel:  Recent Labs Lab 06/14/15 0007 06/15/15 0514  NA 146* 139  K 3.6 4.2  CL 109 107  CO2 24 26  GLUCOSE 131* 103*  BUN 11 10  CREATININE 0.70 0.50  CALCIUM 8.5* 9.0   CBC:  Recent Labs Lab 06/14/15 0007 06/14/15 1556 06/15/15 0514  WBC 15.8* 8.7 9.1  HGB 6.9* 14.8 15.3*  HCT 20.7* 43.1 44.7  MCV 82.8 82.1 83.7  PLT 570* 360 345   BNP (last 3 results)  Recent Labs  10/07/14 0237  BNP 11.6    Signed:  Barton Dubois MD.  [facility] 06/15/2015, 11:58 AM

## 2015-06-25 ENCOUNTER — Inpatient Hospital Stay: Payer: Self-pay | Admitting: Internal Medicine

## 2015-06-26 ENCOUNTER — Inpatient Hospital Stay: Payer: Self-pay | Admitting: Internal Medicine

## 2015-06-28 ENCOUNTER — Encounter: Payer: Self-pay | Admitting: Obstetrics & Gynecology

## 2015-07-03 ENCOUNTER — Inpatient Hospital Stay: Payer: Self-pay | Admitting: Internal Medicine

## 2015-07-10 ENCOUNTER — Ambulatory Visit: Payer: Self-pay | Attending: Internal Medicine | Admitting: Internal Medicine

## 2015-07-10 ENCOUNTER — Encounter: Payer: Self-pay | Admitting: Internal Medicine

## 2015-07-10 VITALS — BP 130/84 | HR 94 | Temp 98.0°F | Resp 20 | Ht 68.5 in | Wt 315.0 lb

## 2015-07-10 DIAGNOSIS — E662 Morbid (severe) obesity with alveolar hypoventilation: Secondary | ICD-10-CM | POA: Insufficient documentation

## 2015-07-10 DIAGNOSIS — R0600 Dyspnea, unspecified: Secondary | ICD-10-CM

## 2015-07-10 DIAGNOSIS — Z6841 Body Mass Index (BMI) 40.0 and over, adult: Secondary | ICD-10-CM | POA: Insufficient documentation

## 2015-07-10 DIAGNOSIS — Z79899 Other long term (current) drug therapy: Secondary | ICD-10-CM | POA: Insufficient documentation

## 2015-07-10 DIAGNOSIS — F419 Anxiety disorder, unspecified: Secondary | ICD-10-CM | POA: Insufficient documentation

## 2015-07-10 DIAGNOSIS — Z888 Allergy status to other drugs, medicaments and biological substances status: Secondary | ICD-10-CM | POA: Insufficient documentation

## 2015-07-10 DIAGNOSIS — IMO0001 Reserved for inherently not codable concepts without codable children: Secondary | ICD-10-CM

## 2015-07-10 DIAGNOSIS — Z9189 Other specified personal risk factors, not elsewhere classified: Secondary | ICD-10-CM

## 2015-07-10 DIAGNOSIS — R0602 Shortness of breath: Secondary | ICD-10-CM | POA: Insufficient documentation

## 2015-07-10 DIAGNOSIS — F329 Major depressive disorder, single episode, unspecified: Secondary | ICD-10-CM

## 2015-07-10 DIAGNOSIS — F32A Depression, unspecified: Secondary | ICD-10-CM

## 2015-07-10 DIAGNOSIS — D649 Anemia, unspecified: Secondary | ICD-10-CM

## 2015-07-10 MED ORDER — POLYSACCHARIDE IRON COMPLEX 150 MG PO CAPS: 150.0000 mg | ORAL_CAPSULE | Freq: Every day | ORAL | Status: DC

## 2015-07-10 MED ORDER — PANTOPRAZOLE SODIUM 40 MG PO TBEC: 40.0000 mg | DELAYED_RELEASE_TABLET | Freq: Every day | ORAL | Status: DC

## 2015-07-10 MED ORDER — ESCITALOPRAM OXALATE 20 MG PO TABS: 20.0000 mg | ORAL_TABLET | Freq: Every day | ORAL | Status: DC

## 2015-07-10 MED ORDER — TOPIRAMATE 50 MG PO TABS: 50.0000 mg | ORAL_TABLET | Freq: Every day | ORAL | Status: DC

## 2015-07-10 MED FILL — TOPIRAMATE 50 MG TABLET: 50 | 30 days supply | Qty: 30 | Fill #0

## 2015-07-10 MED FILL — ?ESCITALOPRAM 20 MG TABLET: 20 | 30 days supply | Qty: 30 | Fill #0

## 2015-07-10 MED FILL — POLY-IRON 150 MG CAPSULE: 150 | 30 days supply | Qty: 30 | Fill #0

## 2015-07-10 MED FILL — ?PANTOPRAZOLE SOD DR 40MG: 40 MG | 30 days supply | Qty: 30 | Fill #0

## 2015-07-10 NOTE — Progress Notes (Signed)
HFU vaginal bleeding x 1 month, referral GYN  Chest pain x 11 month with movement  Hx anemia, Hx panic attack  No tobacco user  Medication refills  No suicidal thoughts in the past two weeks

## 2015-07-10 NOTE — Progress Notes (Signed)
Donna [name], is a 50 y.o. female  [id number]  [id number]  DOB - [birth year]  CC:  Chief Complaint  Patient presents with  . Hospitalization Follow-up       HPI: Donna [name] is a 49 y.o. female here today to establish medical care.  Has not seen MD at least 4-5 years sinc she lost her medical insurance after the divorce.  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  Pt recently hospitalized 4/19-21 for sx anemia.  At time c/o of 54month hx of DOE, and  heavy menstrual periords (bleeds through 15 tampons /day.)   Pt had pelvic/vaginal [place] w/o major abnormalities, and received 2 units of PRBC.  Case was discussed w/ OBgyn service, and recd starting Megace 40mg  po bid, for which she is still taking.  She is here for medical f/u.  Still says she gets profoundly short of breath, w/ only ambulating short distances.  She has had no chest pain though, and no hx of prior stress test or cardiac cath.  She states she gets so winded easily, that just wants to sleep all day. +sedentary lifestyle.  Pt's baseline weight usually 290s, currently 315lbs.  Currently pt states she started her menses again about 3 days ago, regular bleeding though, maybe 10tampons /max a day.   Pt never smoked, but hx of a lot of 2nd hand tobacco exposure.  Feels sad/depressed as well about her currently medical problems.  Currently not taking anything. C/o of difficult concentrating sometimes. Also does not sleep well as well. Denies Si/hi/ah/vh.  Pt use to be on  A lot of pain meds but since stopped seeing Doctors, has not been on any pain meds.  She tolerates her constant pain.  The DOE /Sob is what is more worrisome to her.  Pt is here w/ her son. She has a rolling walker w/ her.  Patient has No headache, No chest pain, No abdominal pain - No Nausea, No new weakness tingling or numbness, No  Cough..  Allergies  Allergen Reactions  . Celebrex [Celecoxib] Other (See Comments)    Facial edema   . Oxycontin [Oxycodone Hcl] Other (See Comments)    Hallucinations.  Has taken morphine and other pain meds and is fine.   History reviewed. No pertinent past medical history. Current Outpatient Prescriptions on File Prior to Visit  Medication Sig Dispense Refill  . acetaminophen (TYLENOL) 500 MG tablet Take 2 tablets (1,000 mg total) by mouth every 6 (six) hours as needed (for migraines). Headache 30 tablet 0  . megestrol (MEGACE) 40 MG tablet Take 1 tablet (40 mg total) by mouth 2 (two) times daily. 60 tablet 0  . Multiple Vitamins-Minerals (MULTIVITAMIN ADULT PO) Take 2 tablets by mouth daily.     No current facility-administered medications on file prior to visit.   Family History  Problem Relation Age of Onset  . Cancer Mother   . Heart disease Mother   . Stroke Mother   . Hypertension Mother   . Heart disease Father    Social History   Social History  . Marital Status: Divorced    Spouse Name: N/A  . Number of Children: N/A  . Years of Education: N/A   Occupational History  . Not on file.   Social History Main Topics  . Smoking status: Never Smoker   . Smokeless tobacco: Not on file  . Alcohol  Use: No  . Drug Use: No  . Sexual Activity: Not on file   Other Topics Concern  . Not on file   Social History Narrative    Review of Systems: Constitutional: Negative for fever, chills, diaphoresis, activity change, appetite change; + constant  Fatigue/easy DOE/SOB with mild exertion x 10 months.   +sees spots sometimes when walking long periods.  Denies syncope.  HENT: Negative for ear pain, nosebleeds, congestion, facial swelling, rhinorrhea, neck pain, neck stiffness and ear discharge.  Eyes: Negative for pain, discharge, redness, itching and visual disturbance. Respiratory: Negative for cough, choking, chest tightness, shortness of breath, wheezing and stridor.    Cardiovascular: Negative for chest pain, palpitations and leg swelling. Gastrointestinal: Negative for abdominal distention.  +gi upset/dark green loose bowels w/ iron pills, so skips it sometimes. Genitourinary: Negative for dysuria, urgency, frequency, hematuria, flank pain, decreased urine volume, difficulty urinating and dyspareunia.  Musculoskeletal: Negative for  joint swelling, arthralgia and gait problem.  + chronic back pain, uses walker. Neurological: Negative for dizziness, tremors, seizures, syncope, facial asymmetry, speech difficulty, weakness, light-headedness, numbness and headaches.   Hematological: Negative for adenopathy. Does not bruise/bleed easily. Psychiatric/Behavioral: Negative for hallucinations, behavioral problems, confusion, dysphoric mood, decreased concentration and agitation.    Objective:   Filed Vitals:   07/10/15 1526  BP: 130/84  Pulse: 94  Temp: 98 F (36.7 C)  Resp: 20    Filed Weights   07/10/15 1526  Weight: 315 lb (142.883 kg)    BP Readings from Last 3 Encounters:  07/10/15 130/84  06/15/15 136/79  10/07/14 142/85   bmi 47.  Physical Exam: Constitutional: Patient appears well-developed and well-nourished. No distress. AAOx3, extreme morbid obesity. HENT: Normocephalic, atraumatic, External right and left ear normal. Oropharynx is clear and moist.  Eyes: Conjunctivae and EOM are normal. PERRL, no scleral icterus. Neck: Normal ROM. Neck supple. No JVD.  CVS: RRR, S1/S2 +, no murmurs, no gallops, no carotid bruit.  Pulmonary: Effort and breath sounds normal clear;, no stridor, rhonchi, wheezes, rales.  Abdominal: Soft. BS +, large pannus; no  tenderness, rebound or guarding.  Musculoskeletal: Normal range of motion. No edema and no tenderness.  LE: bilat/ no c/c/e, pulses 2+ bilateral. Lymphadenopathy: No lymphadenopathy noted, cervical Neuro: Alert.   muscle tone coordination. No cranial nerve deficit grossly. Skin: Skin is warm  and dry. No rash noted. Not diaphoretic. No erythema. No pallor. Psychiatric: Normal mood and affect. Behavior, judgment, thought content normal.  Lab Results  Component Value Date   WBC 9.1 06/15/2015   HGB 15.3* 06/15/2015   HCT 44.7 06/15/2015   MCV 83.7 06/15/2015   PLT 345 06/15/2015   Lab Results  Component Value Date   CREATININE 0.50 06/15/2015   BUN 10 06/15/2015   NA 139 06/15/2015   K 4.2 06/15/2015   CL 107 06/15/2015   CO2 26 06/15/2015    No results found for: HGBA1C Lipid Panel  No results found for: CHOL, TRIG, HDL, CHOLHDL, VLDL, LDLCALC     No flowsheet data found.  Assessment and plan:   1. Symptomatic anemia - sp 2 units prbc trx in hospital recently, currently per pt started back on her period 3 days ago, going through about 10 tampons daily now (less than 15 she told ER recently) - CBC with Differential - BASIC METABOLIC PANEL WITH GFR - Ambulatory referral to Obstetrics / Gynecology - f/u, needs PAP SMEAR as well, may need large retractors given Pt's size, recd Ob does  it  2. Morbid obesity due to excess calories ([place]) - recd slowly increasing her indurance/exercise tolerance and watching her intake., loss of bit of weight will help her immensely. - TSH - Vitamin D, 25-hydroxy  3. Dyspnea - may be multificatorial, given sx anemia, morbid obesity, certainly obesity hypoventilation syndrome vs OSA a consideration as well vs COPD - recd PFTS and sleep study once pt gets [facility] discounts.  4. Onset of menses X 3 days  5. Anxiety/depression - d/w pt, denies si/hi/ah/vh, did not like celexa in past, trial Lexapro 20mg  qday.  Return in about 3 weeks (around 07/31/2015) for sx anemia, sob/doe.  The patient was given clear instructions to go to ER or return to medical center if symptoms don't improve, worsen or new problems develop. The patient verbalized understanding. The patient was told to call to get lab results if they haven't heard anything in the  next week.      Maren Reamer, MD, [place], [place]   07/10/2015, 4:48 PM

## 2015-07-10 NOTE — Patient Instructions (Signed)
Anemia, Nonspecific Anemia is a condition in which the concentration of red blood cells or hemoglobin in the blood is below normal. Hemoglobin is a substance in red blood cells that carries oxygen to the tissues of the body. Anemia results in not enough oxygen reaching these tissues.  CAUSES  Common causes of anemia include:   Excessive bleeding. Bleeding may be internal or external. This includes excessive bleeding from periods (in women) or from the intestine.   Poor nutrition.   Chronic kidney, thyroid, and liver disease.  Bone marrow disorders that decrease red blood cell production.  Cancer and treatments for cancer.  HIV, AIDS, and their treatments.  Spleen problems that increase red blood cell destruction.  Blood disorders.  Excess destruction of red blood cells due to infection, medicines, and autoimmune disorders. SIGNS AND SYMPTOMS   Minor weakness.   Dizziness.   Headache.  Palpitations.   Shortness of breath, especially with exercise.   Paleness.  Cold sensitivity.  Indigestion.  Nausea.  Difficulty sleeping.  Difficulty concentrating. Symptoms may occur suddenly or they may develop slowly.  DIAGNOSIS  Additional blood tests are often needed. These help your health care provider determine the best treatment. Your health care provider will check your stool for blood and look for other causes of blood loss.  TREATMENT  Treatment varies depending on the cause of the anemia. Treatment can include:   Supplements of iron, vitamin 123456, or folic acid.   Hormone medicines.   A blood transfusion. This may be needed if blood loss is severe.   Hospitalization. This may be needed if there is significant continual blood loss.   Dietary changes.  Spleen removal. HOME CARE INSTRUCTIONS Keep all follow-up appointments. It often takes many weeks to correct anemia, and having your health care provider check on your condition and your response to  treatment is very important. SEEK IMMEDIATE MEDICAL CARE IF:   You develop extreme weakness, shortness of breath, or chest pain.   You become dizzy or have trouble concentrating.  You develop heavy vaginal bleeding.   You develop a rash.   You have bloody or black, tarry stools.   You faint.   You vomit up blood.   You vomit repeatedly.   You have abdominal pain.  You have a fever or persistent symptoms for more than 2-3 days.   You have a fever and your symptoms suddenly get worse.   You are dehydrated.  MAKE SURE YOU:  Understand these instructions.  Will watch your condition.  Will get help right away if you are not doing well or get worse.   This information is not intended to replace advice given to you by your health care provider. Make sure you discuss any questions you have with your health care provider.   Document Released: 03/20/2004 Document Revised: 10/13/2012 Document Reviewed: 08/06/2012 Elsevier Interactive Patient Education 2016 [employer].   Exercising to [employer] Exercising can help you to lose weight. In order to lose weight through exercise, you need to do vigorous-intensity exercise. You can tell that you are exercising with vigorous intensity if you are breathing very hard and fast and cannot hold a conversation while exercising. Moderate-intensity exercise helps to maintain your current weight. You can tell that you are exercising at a moderate level if you have a higher heart rate and faster breathing, but you are still able to hold a conversation. HOW OFTEN SHOULD I EXERCISE? Choose an activity that you enjoy and set realistic goals.  Your health care provider can help you to make an activity plan that works for you. Exercise regularly as directed by your health care provider. This may include:  Doing resistance training twice each week, such as:  Push-ups.  Sit-ups.  Lifting weights.  Using resistance bands.  Doing a  given intensity of exercise for a given amount of time. Choose from these options:  150 minutes of moderate-intensity exercise every week.  75 minutes of vigorous-intensity exercise every week.  A mix of moderate-intensity and vigorous-intensity exercise every week. Children, pregnant women, people who are out of shape, people who are overweight, and older adults may need to consult a health care provider for individual recommendations. If you have any sort of medical condition, be sure to consult your health care provider before starting a new exercise program. WHAT ARE SOME ACTIVITIES THAT CAN HELP ME TO LOSE WEIGHT?   Walking at a rate of at least 4.5 miles an hour.  Jogging or running at a rate of 5 miles per hour.  Biking at a rate of at least 10 miles per hour.  Lap swimming.  Roller-skating or in-line skating.  Cross-country skiing.  Vigorous competitive sports, such as football, basketball, and soccer.  Jumping rope.  Aerobic dancing. HOW CAN I BE MORE ACTIVE IN MY DAY-TO-DAY ACTIVITIES?  Use the stairs instead of the elevator.  Take a walk during your lunch break.  If you drive, park your car farther away from work or school.  If you take public transportation, get off one stop early and walk the rest of the way.  Make all of your phone calls while standing up and walking around.  Get up, stretch, and walk around every 30 minutes throughout the day. WHAT GUIDELINES SHOULD I FOLLOW WHILE EXERCISING?  Do not exercise so much that you hurt yourself, feel dizzy, or get very short of breath.  Consult your health care provider prior to starting a new exercise program.  Wear comfortable clothes and shoes with good support.  Drink plenty of water while you exercise to prevent dehydration or heat stroke. Body water is lost during exercise and must be replaced.  Work out until you breathe faster and your heart beats faster.   This information is not intended to  replace advice given to you by your health care provider. Make sure you discuss any questions you have with your health care provider.   Document Released: 03/15/2010 Document Revised: 03/03/2014 Document Reviewed: 07/14/2013 Elsevier Interactive Patient Education [employer].

## 2015-07-11 ENCOUNTER — Other Ambulatory Visit: Payer: Self-pay | Admitting: Internal Medicine

## 2015-07-11 LAB — BASIC METABOLIC PANEL WITH GFR
BUN: 11 mg/dL (ref 7–25)
CO2: 18 mmol/L — ABNORMAL LOW (ref 20–31)
Calcium: 9 mg/dL (ref 8.6–10.2)
Chloride: 105 mmol/L (ref 98–110)
Creat: 0.71 mg/dL (ref 0.50–1.10)
GFR, Est African American: >89 mL/min (ref >=60)
GFR, Est Non African American: >89 mL/min (ref >=60)
Glucose, Bld: 105 mg/dL — ABNORMAL HIGH (ref 65–99)
Potassium: 4.2 mmol/L (ref 3.5–5.3)
Sodium: 139 mmol/L (ref 135–146)

## 2015-07-11 LAB — CBC WITH DIFFERENTIAL/PLATELET
Basophils Absolute: 88 cells/uL (ref 0–200)
Basophils Relative: 1 %
Eosinophils Absolute: 88 cells/uL (ref 15–500)
Eosinophils Relative: 1 %
HCT: 45.4 % — ABNORMAL HIGH (ref 35.0–45.0)
Hemoglobin: 15.6 g/dL — ABNORMAL HIGH (ref 11.7–15.5)
Lymphocytes Relative: 38 %
Lymphs Abs: 3344 cells/uL (ref 850–3900)
MCH: 28.8 pg (ref 27.0–33.0)
MCHC: 34.4 g/dL (ref 32.0–36.0)
MCV: 83.9 fL (ref 80.0–100.0)
MPV: 11.5 fL (ref 7.5–12.5)
Monocytes Absolute: 704 cells/uL (ref 200–950)
Monocytes Relative: 8 %
Neutro Abs: 4576 cells/uL (ref 1500–7800)
Neutrophils Relative %: 52 %
Platelets: 372 10*3/uL (ref 140–400)
RBC: 5.41 MIL/uL — ABNORMAL HIGH (ref 3.80–5.10)
RDW: 14.8 % (ref 11.0–15.0)
WBC: 8.8 10*3/uL (ref 3.8–10.8)

## 2015-07-11 LAB — VITAMIN D 25 HYDROXY (VIT D DEFICIENCY, FRACTURES): Vit D, 25-Hydroxy: 14 ng/mL — ABNORMAL LOW (ref 30–100)

## 2015-07-11 LAB — TSH: TSH: 1.67 mIU/L

## 2015-07-11 MED ORDER — VITAMIN D (ERGOCALCIFEROL) 1.25 MG (50000 UNIT) PO CAPS: 50000.0000 [IU] | ORAL_CAPSULE | ORAL | Status: DC

## 2015-07-11 MED FILL — VIT D2 1.25 MG (50,000 UNIT: 1.25 MG | 28 days supply | Qty: 4 | Fill #0

## 2015-07-18 ENCOUNTER — Telehealth: Payer: Self-pay | Admitting: Internal Medicine

## 2015-07-18 NOTE — Telephone Encounter (Signed)
Patient needs megace. Please follow up.

## 2015-07-19 MED ORDER — MEGESTROL ACETATE 40 MG PO TABS: 40.0000 mg | ORAL_TABLET | Freq: Two times a day (BID) | ORAL | Status: DC

## 2015-07-19 NOTE — Telephone Encounter (Signed)
Please call pt: i renewed her megace rx for now until sees Obgyn.  She needs to keep her appt w/ Obgyn when set up, they will be doing pap as well as Endometrial bx to further f/u on heavy bleeding. Thanks.

## 2015-07-19 NOTE — Telephone Encounter (Signed)
Will forward to PCP 

## 2015-07-20 ENCOUNTER — Encounter (HOSPITAL_COMMUNITY): Payer: Self-pay | Admitting: *Deleted

## 2015-07-20 ENCOUNTER — Telehealth: Payer: Self-pay

## 2015-07-20 ENCOUNTER — Inpatient Hospital Stay (HOSPITAL_COMMUNITY)
Admission: AD | Admit: 2015-07-20 | Discharge: 2015-07-20 | Disposition: A | Discharge status: Home / Self Care | Payer: No Typology Code available for payment source | Source: Ambulatory Visit | Attending: Family Medicine | Admitting: Family Medicine

## 2015-07-20 DIAGNOSIS — F419 Anxiety disorder, unspecified: Secondary | ICD-10-CM | POA: Insufficient documentation

## 2015-07-20 DIAGNOSIS — N939 Abnormal uterine and vaginal bleeding, unspecified: Secondary | ICD-10-CM | POA: Insufficient documentation

## 2015-07-20 DIAGNOSIS — Z3202 Encounter for pregnancy test, result negative: Secondary | ICD-10-CM | POA: Insufficient documentation

## 2015-07-20 DIAGNOSIS — M797 Fibromyalgia: Secondary | ICD-10-CM | POA: Insufficient documentation

## 2015-07-20 HISTORY — DX: Anxiety disorder, unspecified: F41.9

## 2015-07-20 HISTORY — DX: Fibromyalgia: M79.7

## 2015-07-20 LAB — URINE MICROSCOPIC-ADD ON

## 2015-07-20 LAB — URINALYSIS, ROUTINE W REFLEX MICROSCOPIC
Bilirubin Urine: NEGATIVE
Glucose, UA: NEGATIVE mg/dL
Ketones, ur: NEGATIVE mg/dL
Leukocytes, UA: NEGATIVE
Nitrite: NEGATIVE
Protein, ur: NEGATIVE mg/dL
Specific Gravity, Urine: 1.01 (ref 1.005–1.030)
pH: 7 (ref 5.0–8.0)

## 2015-07-20 LAB — CBC
HCT: 40 % (ref 36.0–46.0)
Hemoglobin: 13.7 g/dL (ref 12.0–15.0)
MCH: 28.5 pg (ref 26.0–34.0)
MCHC: 34.3 g/dL (ref 30.0–36.0)
MCV: 83.3 fL (ref 78.0–100.0)
Platelets: 368 10*3/uL (ref 150–400)
RBC: 4.8 MIL/uL (ref 3.87–5.11)
RDW: 14.2 % (ref 11.5–15.5)
WBC: 11.5 10*3/uL — ABNORMAL HIGH (ref 4.0–10.5)

## 2015-07-20 LAB — POCT PREGNANCY, URINE: Preg Test, Ur: NEGATIVE

## 2015-07-20 MED ORDER — MEGESTROL ACETATE 40 MG PO TABS: ORAL_TABLET | ORAL | Status: DC

## 2015-07-20 MED ORDER — MEGESTROL ACETATE 20 MG PO TABS: ORAL_TABLET | ORAL | Status: DC

## 2015-07-20 MED ORDER — MEGESTROL ACETATE 40 MG PO TABS
120.0000 mg | ORAL_TABLET | Freq: Once | ORAL | Status: DC
  Filled 2015-07-20: qty 3

## 2015-07-20 MED FILL — MEGESTROL 40 MG TABLET: 40 | 30 days supply | Qty: 60 | Fill #0

## 2015-07-20 NOTE — Telephone Encounter (Signed)
Patient called into clinic and indicated she was hospitalized 06/13/15-06/15/15 for symptomatic anemia due to vaginal bleeding and was given 2 U of PRBC while hospitalized. Patient indicated bleeding stopped while hospitalized. Patient indicated she started "having a normal period" again on 07/07/15; however, patient indicated she continues to have vaginal bleeding. Bleeding now is extremely heavy and she is passing blood clots that are "so large they are pushing out tampons." She indicated this has never occurred before. Patient indicates she is very dizzy and having headaches. This Case Manager indicated she should go to Emergency Department as soon as possible for evaluation of heavy vaginal bleeding. Patient verbalized understanding and indicated she would go to Emergency Department.

## 2015-07-20 NOTE — MAU Provider Note (Signed)
Chief Complaint: Vaginal Bleeding; Shortness of Breath; Dizziness; and Chest Pain   First Provider Initiated Contact with Patient 07/20/15 2154      SUBJECTIVE HPI: Donna [name] is a 50 y.o. [id number] who presents to maternity admissions reporting onset of heavy vaginal bleeding on 5/13, with bleeding with large clots, needing tampon every 2 hours since onset.  She was admitted to [facility] hospital last month for heavy vaginal bleeding and received 4 U PRBCs.  She reports she has dizziness, shortness of breath, and chest pain on exertion, all starting 1 year ago.  These symptoms resolve at times, then start again when she is bleeding.  She was prescribed Megace for her bleeding and is taking 2 tablets of 20 mg daily since hospital discharge last month.  She ran out 2 days ago and has not taken the medicine since then.  She did start a new medication, Lexapro, 2-3 days ago, and thinks this may be contributing to her dizziness and chest pain symptoms feeling worse in the last few days.   She denies vaginal itching/burning, urinary symptoms, h/a, n/v, or fever/chills.     Vaginal Bleeding The patient's primary symptoms include pelvic pain. The patient's pertinent negatives include no vaginal discharge. This is a recurrent problem. The current episode started 1 to 4 weeks ago. The problem occurs constantly. The problem has been unchanged. The pain is moderate. She is not pregnant. Pertinent negatives include no dysuria or flank pain. The symptoms are aggravated by activity. Treatments tried: Megace 40 mg daily. The treatment provided no relief. She is sexually active. No, her partner does not have an STD.    Past Medical History  Diagnosis Date  . Fibromyalgia   . Anxiety    Past Surgical History  Procedure Laterality Date  . Back surgery    . Lumbar  fusions    . Appendectomy     Social History   Social History  . Marital Status: Divorced    Spouse Name: N/A  . Number of Children: N/A  . Years of  Education: N/A   Occupational History  . Not on file.   Social History Main Topics  . Smoking status: Never Smoker   . Smokeless tobacco: Not on file  . Alcohol Use: No  . Drug Use: No  . Sexual Activity: Not on file   Other Topics Concern  . Not on file   Social History Narrative   No current facility-administered medications on file prior to encounter.   Current Outpatient Prescriptions on File Prior to Encounter  Medication Sig Dispense Refill  . acetaminophen (TYLENOL) 500 MG tablet Take 2 tablets (1,000 mg total) by mouth every 6 (six) hours as needed (for migraines). Headache 30 tablet 0  . escitalopram (LEXAPRO) 20 MG tablet Take 1 tablet (20 mg total) by mouth daily. 30 tablet 1  . iron polysaccharides (NIFEREX) 150 MG capsule Take 1 capsule (150 mg total) by mouth daily. 30 capsule 2  . Multiple Vitamins-Minerals (MULTIVITAMIN ADULT PO) Take 2 tablets by mouth daily.    . pantoprazole (PROTONIX) 40 MG tablet Take 1 tablet (40 mg total) by mouth daily. 30 tablet 8  . topiramate (TOPAMAX) 50 MG tablet Take 1 tablet (50 mg total) by mouth at bedtime. 30 tablet 3  . Vitamin D, Ergocalciferol, (DRISDOL) 50000 units CAPS capsule Take 1 capsule (50,000 Units total) by mouth every 7 (seven) days. 12 capsule 0   Allergies  Allergen Reactions  . Celebrex [Celecoxib] Other (See  Comments)    Facial edema   . Oxycontin [Oxycodone Hcl] Other (See Comments)    Hallucinations.  Has taken morphine and other pain meds and is fine.    ROS:  Review of Systems  Genitourinary: Positive for vaginal bleeding and pelvic pain. Negative for dysuria, flank pain, vaginal discharge, difficulty urinating and vaginal pain.  Psychiatric/Behavioral: Negative.      I have reviewed patient's Past Medical Hx, Surgical Hx, Family Hx, Social Hx, medications and allergies.   Physical Exam   Patient Vitals for the past 24 hrs:  BP Temp Pulse Resp SpO2 Height Weight  07/20/15 2229 142/84 mmHg - 102  20 - - -  07/20/15 2045 151/97 mmHg 98.4 F (36.9 C) 104 22 98 % 5' 8.5" (1.74 m) (!) 313 lb 6.4 oz (142.157 kg)   Constitutional: Well-developed, well-nourished female in no acute distress.  Cardiovascular: normal rate Respiratory: normal effort GI: Abd soft, non-tender. Pos BS x 4 MS: Extremities nontender, no edema, normal ROM Neurologic: Alert and oriented x 4.  GU: Neg CVAT.  PELVIC EXAM: Cervix pink, visually closed, without lesion, small amount dark red bleeding, vaginal walls and external genitalia normal Bimanual exam: Cervix 0/long/high, firm, anterior, neg CMT, uterus nontender, nonenlarged, adnexa without tenderness, enlargement, or mass   LAB RESULTS Results for orders placed or performed during the hospital encounter of 07/20/15 (from the past 24 hour(s))  Urinalysis, Routine w reflex microscopic (not at [facility])     Status: Abnormal   Collection Time: 07/20/15  8:53 PM  Result Value Ref Range   Color, Urine YELLOW YELLOW   APPearance CLEAR CLEAR   Specific Gravity, Urine 1.010 1.005 - 1.030   pH 7.0 5.0 - 8.0   Glucose, UA NEGATIVE NEGATIVE mg/dL   Hgb urine dipstick LARGE (A) NEGATIVE   Bilirubin Urine NEGATIVE NEGATIVE   Ketones, ur NEGATIVE NEGATIVE mg/dL   Protein, ur NEGATIVE NEGATIVE mg/dL   Nitrite NEGATIVE NEGATIVE   Leukocytes, UA NEGATIVE NEGATIVE  Urine microscopic-add on     Status: Abnormal   Collection Time: 07/20/15  8:53 PM  Result Value Ref Range   Squamous Epithelial / LPF 0-5 (A) NONE SEEN   WBC, UA 0-5 0 - 5 WBC/hpf   RBC / HPF 0-5 0 - 5 RBC/hpf   Bacteria, UA RARE (A) NONE SEEN  Pregnancy, urine POC     Status: None   Collection Time: 07/20/15  9:03 PM  Result Value Ref Range   Preg Test, Ur NEGATIVE NEGATIVE  CBC     Status: Abnormal   Collection Time: 07/20/15  9:10 PM  Result Value Ref Range   WBC 11.5 (H) 4.0 - 10.5 K/uL   RBC 4.80 3.87 - 5.11 MIL/uL   Hemoglobin 13.7 12.0 - 15.0 g/dL   HCT 40.0 36.0 - 46.0 %   MCV 83.3 78.0 -  100.0 fL   MCH 28.5 26.0 - 34.0 pg   MCHC 34.3 30.0 - 36.0 g/dL   RDW 14.2 11.5 - 15.5 %   Platelets 368 150 - 400 K/uL    --/--/O POS (04/20 0359)  IMAGING No results found.  MAU Management/MDM: Ordered labs and reviewed results.  Pt SOB and CP are chronic, with no acute changes.  She should continue to evaluate these with her PCP.  With stable hemoglobin and minimal bleeding on pelvic exam, pt stable for discharge with outpatient f/u.  Increase Megace Rx and start taper today.  Pt to call clinic to set up  appointment, has applied for [facility] discount but can be seen while waiting.  Pt stable at time of discharge.  ASSESSMENT 1. Abnormal uterine bleeding (AUB)     PLAN Discharge home with bleeding precautions   Medication List    TAKE these medications        acetaminophen 500 MG tablet  Commonly known as:  TYLENOL  Take 2 tablets (1,000 mg total) by mouth every 6 (six) hours as needed (for migraines). Headache     escitalopram 20 MG tablet  Commonly known as:  LEXAPRO  Take 1 tablet (20 mg total) by mouth daily.     iron polysaccharides 150 MG capsule  Commonly known as:  NIFEREX  Take 1 capsule (150 mg total) by mouth daily.     megestrol 40 MG tablet  Commonly known as:  MEGACE  Take 3 tablets daily for 3 days, 2 tablets daily for 3 days, then 1 tablet daily     megestrol 20 MG tablet  Commonly known as:  MEGACE  Take 2 tablets 3 times per day     MULTIVITAMIN ADULT PO  Take 2 tablets by mouth daily.     pantoprazole 40 MG tablet  Commonly known as:  PROTONIX  Take 1 tablet (40 mg total) by mouth daily.     SUDAFED PO  Take 1 tablet by mouth once.     topiramate 50 MG tablet  Commonly known as:  TOPAMAX  Take 1 tablet (50 mg total) by mouth at bedtime.     Vitamin D (Ergocalciferol) 50000 units Caps capsule  Commonly known as:  DRISDOL  Take 1 capsule (50,000 Units total) by mouth every 7 (seven) days.       Follow-up Information    Schedule an  appointment as soon as possible for a visit with [facility].   Specialty:  Obstetrics and Gynecology   Why:  Return to MAU as needed for emergencies   Contact information:   [place] Certified Nurse-Midwife 07/21/2015  5:52 AM

## 2015-07-20 NOTE — MAU Note (Signed)
First of yr bled for 54months. Had to have blood transfusion. Started bleeding 5/13 and has gotten heavier with clots. Clots actually push out tampons. SOB and chest pain for a yr. Which has gotten worse. Got some better after transfusion but still not back to fully functional. Some abd cramping and dizzy. Sometimes hard to focus

## 2015-07-20 NOTE — Telephone Encounter (Signed)
This Case Manager placed call to patient to check on status and to again instruct patient to go to ED if still having heavy vaginal bleeding. Call placed to [phone]; unable to reach patient. Unable to leave voicemail as recording indicated patient "unable to take call at this time."

## 2015-07-20 NOTE — Discharge Instructions (Signed)

## 2015-07-25 NOTE — Telephone Encounter (Signed)
Clld pt - [place] re Rx request.

## 2015-08-17 ENCOUNTER — Ambulatory Visit (INDEPENDENT_AMBULATORY_CARE_PROVIDER_SITE_OTHER): Payer: Self-pay | Admitting: Obstetrics & Gynecology

## 2015-08-17 ENCOUNTER — Encounter: Payer: Self-pay | Admitting: Obstetrics & Gynecology

## 2015-08-17 ENCOUNTER — Other Ambulatory Visit (HOSPITAL_COMMUNITY)
Admission: RE | Admit: 2015-08-17 | Discharge: 2015-08-17 | Disposition: A | Discharge status: Home / Self Care | Payer: Self-pay | Source: Ambulatory Visit | Attending: Obstetrics & Gynecology | Admitting: Obstetrics & Gynecology

## 2015-08-17 VITALS — BP 144/97 | HR 92 | Ht 68.5 in | Wt 307.5 lb

## 2015-08-17 DIAGNOSIS — Z124 Encounter for screening for malignant neoplasm of cervix: Secondary | ICD-10-CM

## 2015-08-17 DIAGNOSIS — Z3202 Encounter for pregnancy test, result negative: Secondary | ICD-10-CM

## 2015-08-17 DIAGNOSIS — N938 Other specified abnormal uterine and vaginal bleeding: Secondary | ICD-10-CM

## 2015-08-17 LAB — POCT PREGNANCY, URINE: Preg Test, Ur: NEGATIVE

## 2015-08-17 NOTE — Progress Notes (Signed)
   Subjective:    Patient ID: Donna [name], female    DOB: February 20, 1966, 50 y.o.   MRN: [id number]  HPI 50 yo SW P1 (67 yo son) here for a EMBX. She has had irregular heavy bleeding.  She has been abstinent for about 18 months. Prior to that she used condoms.   Review of Systems     Objective:   Physical Exam  Morbidly obese WF, somewhat nervous  UPT negative, consent signed, time out done Cervix prepped with betadine and grasped with a single tooth tenaculum Uterus sounded to 9 cm Pipelle used for 2 passes with a small amount of tissue obtained. She tolerated the procedure well.       Assessment & Plan:  DUB- await biopsy If ok, then rec d&c with Novasure ablation

## 2015-08-17 NOTE — Addendum Note (Signed)
Addended by: Riccardo Dubin on: 08/17/2015 09:34 AM   Modules accepted: Orders

## 2015-08-20 LAB — CYTOLOGY - PAP

## 2015-09-05 ENCOUNTER — Telehealth: Payer: Self-pay | Admitting: *Deleted

## 2015-09-05 NOTE — Telephone Encounter (Signed)
Called pt and left message that I am calling with non urgent information and will call back. *Pt needs to be informed of the following: Her recent Pap was negative for abnormality but is HR HPV positive. She will need Pap w/co-testing in 1 year. Her Endo Bx was also negative and Dr. Hulan Fray would like her to schedule appt to discuss treatment options of her DUB.

## 2015-09-24 NOTE — Telephone Encounter (Signed)
I called Donna [name] and left a message I am calling her with some follow up information from her last visit in our office. Please call our office.

## 2015-09-26 ENCOUNTER — Encounter: Payer: Self-pay | Admitting: *Deleted

## 2015-09-26 NOTE — Telephone Encounter (Signed)
Attempted to call patient again. The phone was picked up but no one spoke. I hung up and dialed again and it went to voice mail. I left a message stating I am calling with test results and some other information, please return my call at the clinic. Will also send letter.

## 2015-10-02 ENCOUNTER — Telehealth: Payer: Self-pay | Admitting: *Deleted

## 2015-10-02 NOTE — Telephone Encounter (Signed)
Pt called requesting results of her biopsy and what the plan for her care will be. She states that we can leave a detailed message with her results on [phone].

## 2015-10-03 NOTE — Telephone Encounter (Signed)
Called pt and informed her of biopsy results and plan of care as per Dr. Alease Medina visit note on 6/23.  [facility]  w/Novasure ablation) Pt states she has no insurance and has not been able to complete the financial assistance paperwork because she has misplaced it. She requested to have another packet sent to her. I verified her address and stated that we will send the necessary paperwork. She should complete and return to our office as soon as possible. Pt also states that she had been bleeding constantly but now has not had bleeding for 2 days and she is pleased. She stopped taking the Megace about 1 month ago because she did not realize that she had refills ordered. I advised pt to obtain a refill if she begins bleeding again and take medication as ordered. Pt voiced understanding of all information and instructions given.

## 2015-10-14 ENCOUNTER — Encounter: Payer: Self-pay | Admitting: Family Medicine

## 2015-12-14 ENCOUNTER — Emergency Department (HOSPITAL_COMMUNITY)
Admission: EM | Admit: 2015-12-14 | Discharge: 2015-12-14 | Disposition: A | Discharge status: Home / Self Care | Payer: No Typology Code available for payment source | Attending: Emergency Medicine | Admitting: Emergency Medicine

## 2015-12-14 ENCOUNTER — Emergency Department (HOSPITAL_COMMUNITY): Payer: No Typology Code available for payment source

## 2015-12-14 ENCOUNTER — Encounter (HOSPITAL_COMMUNITY): Payer: Self-pay

## 2015-12-14 DIAGNOSIS — M542 Cervicalgia: Secondary | ICD-10-CM | POA: Insufficient documentation

## 2015-12-14 DIAGNOSIS — Y92410 Unspecified street and highway as the place of occurrence of the external cause: Secondary | ICD-10-CM | POA: Diagnosis not present

## 2015-12-14 DIAGNOSIS — M545 Low back pain: Secondary | ICD-10-CM | POA: Insufficient documentation

## 2015-12-14 DIAGNOSIS — Y999 Unspecified external cause status: Secondary | ICD-10-CM | POA: Insufficient documentation

## 2015-12-14 DIAGNOSIS — Y9389 Activity, other specified: Secondary | ICD-10-CM | POA: Diagnosis not present

## 2015-12-14 DIAGNOSIS — M7918 Myalgia, other site: Secondary | ICD-10-CM

## 2015-12-14 HISTORY — DX: Other chronic pain: G89.29

## 2015-12-14 HISTORY — DX: Dorsalgia, unspecified: M54.9

## 2015-12-14 MED ORDER — CYCLOBENZAPRINE HCL 10 MG PO TABS: 10.0000 mg | ORAL_TABLET | Freq: Three times a day (TID) | ORAL | 0 refills | Status: DC | PRN

## 2015-12-14 MED ORDER — HYDROCODONE-ACETAMINOPHEN 5-325 MG PO TABS
1.0000 | ORAL_TABLET | Freq: Once | ORAL | Status: AC
  Administered 2015-12-14: 1 via ORAL
  Filled 2015-12-14: qty 1

## 2015-12-14 MED ORDER — HYDROCODONE-ACETAMINOPHEN 5-325 MG PO TABS: 1.0000 | ORAL_TABLET | Freq: Four times a day (QID) | ORAL | 0 refills | Status: DC | PRN

## 2015-12-14 NOTE — ED Notes (Addendum)
Raquel Sarna PA verbalizes will see pt in minor care room.

## 2015-12-14 NOTE — ED Notes (Signed)
Pt. returned from XR. 
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
## 2015-12-14 NOTE — ED Notes (Signed)
Bed: [facility] Expected date:  Expected time:  Means of arrival:  Comments: 
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
## 2015-12-14 NOTE — Discharge Instructions (Signed)
Read the information below.  Use the prescribed medication as directed.  Please discuss all new medications with your pharmacist.  Do not take additional tylenol while taking the prescribed pain medication to avoid overdose.  You may return to the Emergency Department at any time for worsening condition or any new symptoms that concern you.    °
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
## 2015-12-22 ENCOUNTER — Emergency Department (HOSPITAL_COMMUNITY): Payer: No Typology Code available for payment source

## 2015-12-22 ENCOUNTER — Emergency Department (HOSPITAL_COMMUNITY)
Admission: EM | Admit: 2015-12-22 | Discharge: 2015-12-22 | Disposition: A | Discharge status: Home / Self Care | Payer: No Typology Code available for payment source | Attending: Emergency Medicine | Admitting: Emergency Medicine

## 2015-12-22 ENCOUNTER — Encounter (HOSPITAL_COMMUNITY): Payer: Self-pay | Admitting: Emergency Medicine

## 2015-12-22 DIAGNOSIS — Y939 Activity, unspecified: Secondary | ICD-10-CM | POA: Diagnosis not present

## 2015-12-22 DIAGNOSIS — Y929 Unspecified place or not applicable: Secondary | ICD-10-CM | POA: Insufficient documentation

## 2015-12-22 DIAGNOSIS — Y999 Unspecified external cause status: Secondary | ICD-10-CM | POA: Diagnosis not present

## 2015-12-22 DIAGNOSIS — M79672 Pain in left foot: Secondary | ICD-10-CM | POA: Diagnosis present

## 2015-12-22 DIAGNOSIS — Z79899 Other long term (current) drug therapy: Secondary | ICD-10-CM | POA: Insufficient documentation

## 2015-12-22 MED ORDER — METHOCARBAMOL 500 MG PO TABS: 500.0000 mg | ORAL_TABLET | Freq: Two times a day (BID) | ORAL | 0 refills | Status: DC

## 2015-12-22 MED ORDER — HYDROCODONE-ACETAMINOPHEN 5-325 MG PO TABS: 1.0000 | ORAL_TABLET | ORAL | 0 refills | Status: DC | PRN

## 2015-12-22 MED ORDER — KETOROLAC TROMETHAMINE 30 MG/ML IJ SOLN
15.0000 mg | Freq: Once | INTRAMUSCULAR | Status: AC
  Administered 2015-12-22: 15 mg via INTRAMUSCULAR
  Filled 2015-12-22: qty 1

## 2015-12-22 MED ORDER — HYDROCODONE-ACETAMINOPHEN 5-325 MG PO TABS
1.0000 | ORAL_TABLET | Freq: Once | ORAL | Status: AC
  Administered 2015-12-22: 1 via ORAL
  Filled 2015-12-22: qty 1

## 2015-12-22 MED ORDER — IBUPROFEN 600 MG PO TABS: 600.0000 mg | ORAL_TABLET | Freq: Four times a day (QID) | ORAL | 0 refills | Status: DC | PRN
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
## 2015-12-22 NOTE — Discharge Instructions (Signed)
Take your medications as prescribed as needed for pain relief. I recommend continuing to rest, elevate and ice her left foot for 15 minutes 3-4 times daily. You may also apply an Ace wrap to your left foot/ankle as needed for additional pain relief. Please follow up with a primary care provider from the Resource Guide provided below in one week if your symptoms have not improved. Please return to the Emergency Department if symptoms worsen or new onset of fever, redness, swelling, warmth, numbness, tingling, weakness.

## 2015-12-22 NOTE — ED Notes (Signed)
Bed: WTR8 Expected date:  Expected time:  Means of arrival:  Comments: 

## 2016-03-24 ENCOUNTER — Encounter (HOSPITAL_COMMUNITY): Payer: Self-pay | Admitting: Oncology

## 2016-03-24 ENCOUNTER — Telehealth: Payer: Self-pay | Admitting: *Deleted

## 2016-03-24 ENCOUNTER — Emergency Department (HOSPITAL_COMMUNITY)
Admission: EM | Admit: 2016-03-24 | Discharge: 2016-03-24 | Disposition: A | Discharge status: Home / Self Care | Payer: Self-pay | Attending: Emergency Medicine | Admitting: Emergency Medicine

## 2016-03-24 DIAGNOSIS — N12 Tubulo-interstitial nephritis, not specified as acute or chronic: Secondary | ICD-10-CM | POA: Insufficient documentation

## 2016-03-24 LAB — CBC WITH DIFFERENTIAL/PLATELET
Basophils Absolute: 0 10*3/uL (ref 0.0–0.1)
Basophils Relative: 0 %
Eosinophils Absolute: 0 10*3/uL (ref 0.0–0.7)
Eosinophils Relative: 0 %
HCT: 40.8 % (ref 36.0–46.0)
Hemoglobin: 13.6 g/dL (ref 12.0–15.0)
Lymphocytes Relative: 17 %
Lymphs Abs: 2.4 10*3/uL (ref 0.7–4.0)
MCH: 27.4 pg (ref 26.0–34.0)
MCHC: 33.3 g/dL (ref 30.0–36.0)
MCV: 82.3 fL (ref 78.0–100.0)
Monocytes Absolute: 1.2 10*3/uL — ABNORMAL HIGH (ref 0.1–1.0)
Monocytes Relative: 8 %
Neutro Abs: 10.8 10*3/uL — ABNORMAL HIGH (ref 1.7–7.7)
Neutrophils Relative %: 75 %
Platelets: 375 10*3/uL (ref 150–400)
RBC: 4.96 MIL/uL (ref 3.87–5.11)
RDW: 15 % (ref 11.5–15.5)
WBC: 14.5 10*3/uL — ABNORMAL HIGH (ref 4.0–10.5)

## 2016-03-24 LAB — URINALYSIS, ROUTINE W REFLEX MICROSCOPIC
Bilirubin Urine: NEGATIVE
Glucose, UA: NEGATIVE mg/dL
Ketones, ur: NEGATIVE mg/dL
Nitrite: POSITIVE — AB
Protein, ur: 100 mg/dL — AB
Specific Gravity, Urine: 1.024 (ref 1.005–1.030)
pH: 5 (ref 5.0–8.0)

## 2016-03-24 LAB — BASIC METABOLIC PANEL
Anion gap: 11 (ref 5–15)
BUN: 12 mg/dL (ref 6–20)
CO2: 25 mmol/L (ref 22–32)
Calcium: 8.8 mg/dL — ABNORMAL LOW (ref 8.9–10.3)
Chloride: 101 mmol/L (ref 101–111)
Creatinine, Ser: 0.8 mg/dL (ref 0.44–1.00)
GFR calc Af Amer: >60 mL/min (ref >60)
GFR calc non Af Amer: >60 mL/min (ref >60)
Glucose, Bld: 128 mg/dL — ABNORMAL HIGH (ref 65–99)
Potassium: 3.8 mmol/L (ref 3.5–5.1)
Sodium: 137 mmol/L (ref 135–145)

## 2016-03-24 LAB — PREGNANCY, URINE: Preg Test, Ur: NEGATIVE

## 2016-03-24 MED ORDER — FENTANYL CITRATE (PF) 100 MCG/2ML IJ SOLN
100.0000 ug | Freq: Once | INTRAMUSCULAR | Status: AC
  Administered 2016-03-24: 100 ug via INTRAVENOUS
  Filled 2016-03-24: qty 2

## 2016-03-24 MED ORDER — ONDANSETRON HCL 4 MG/2ML IJ SOLN
4.0000 mg | Freq: Once | INTRAMUSCULAR | Status: AC
  Administered 2016-03-24: 4 mg via INTRAVENOUS
  Filled 2016-03-24: qty 2

## 2016-03-24 MED ORDER — HYDROCODONE-ACETAMINOPHEN 5-325 MG PO TABS
1.0000 | ORAL_TABLET | Freq: Once | ORAL | Status: AC
  Administered 2016-03-24: 1 via ORAL
  Filled 2016-03-24: qty 1

## 2016-03-24 MED ORDER — HYDROCODONE-ACETAMINOPHEN 5-325 MG PO TABS: 1.0000 | ORAL_TABLET | Freq: Four times a day (QID) | ORAL | 0 refills | Status: DC | PRN

## 2016-03-24 MED ORDER — SODIUM CHLORIDE 0.9 % IV BOLUS (SEPSIS)
1000.0000 mL | Freq: Once | INTRAVENOUS | Status: AC
  Administered 2016-03-24: 1000 mL via INTRAVENOUS

## 2016-03-24 MED ORDER — CEPHALEXIN 500 MG PO CAPS: 500.0000 mg | ORAL_CAPSULE | Freq: Four times a day (QID) | ORAL | 0 refills | Status: DC

## 2016-03-24 MED ORDER — DEXTROSE 5 % IV SOLN
1.0000 g | Freq: Once | INTRAVENOUS | Status: AC
  Administered 2016-03-24: 1 g via INTRAVENOUS
  Filled 2016-03-24: qty 10

## 2016-03-24 NOTE — ED Provider Notes (Signed)
[facility] DEPT Provider Note   CSN: [id number] Arrival date & time: 03/24/16  0353     History   Chief Complaint Chief Complaint  Patient presents with  . Urinary Tract Infection    HPI Donna [name] is a 51 y.o. female.  The history is provided by the patient.  Urinary Tract Infection   This is a new problem. The current episode started more than 2 days ago. The problem has been gradually worsening. The pain is severe. There has been no fever. Associated symptoms include chills, nausea, vomiting, frequency, urgency and flank pain. Treatments tried: AZO.  Patient reports onset of urinary frequency/dysuria approximately 2-3 days ago Now she is having chills, flank/back pain and also vomiting She does not recall having similar symptoms previously   Past Medical History:  Diagnosis Date  . Anxiety   . Chronic back pain   . Fibromyalgia     Patient Active Problem List   Diagnosis Date Noted  . Esophageal reflux   . Chronic migraine without aura without status migrainosus, not intractable   . Morbid obesity due to excess calories ([place])   . Symptomatic anemia 06/14/2015  . Menorrhagia 06/14/2015    Past Surgical History:  Procedure Laterality Date  . APPENDECTOMY    . BACK SURGERY    . lumbar  fusions      OB History    Gravida Para Term Preterm AB Living   3 2 2  0 1 2   SAB TAB Ectopic Multiple Live Births   1 0 0 0 2       Home Medications    Prior to Admission medications   Medication Sig Start Date End Date Taking? Authorizing Provider  acetaminophen (TYLENOL) 500 MG tablet Take 2 tablets (1,000 mg total) by mouth every 6 (six) hours as needed (for migraines). Headache Patient taking differently: Take 1,000 mg by mouth every 6 (six) hours as needed for mild pain or moderate pain (for migraines). Headache 06/15/15  Yes Barton Dubois, MD  phenazopyridine (PYRIDIUM) 200 MG tablet Take 200 mg by mouth 3 (three) times daily as needed for pain.   Yes  Historical Provider, MD  cyclobenzaprine (FLEXERIL) 10 MG tablet Take 1 tablet (10 mg total) by mouth 3 (three) times daily as needed for muscle spasms (and pain). Patient not taking: Reported on 03/24/2016 12/14/15   Clayton Bibles, PA-C  escitalopram (LEXAPRO) 20 MG tablet Take 1 tablet (20 mg total) by mouth daily. Patient not taking: Reported on 12/14/2015 07/10/15   Maren Reamer, MD  HYDROcodone-acetaminophen (NORCO/VICODIN) 5-325 MG tablet Take 1 tablet by mouth every 6 (six) hours as needed for severe pain. Patient not taking: Reported on 03/24/2016 12/14/15   Clayton Bibles, PA-C  HYDROcodone-acetaminophen (NORCO/VICODIN) 5-325 MG tablet Take 1 tablet by mouth every 4 (four) hours as needed. Patient not taking: Reported on 03/24/2016 12/22/15   Nona Dell, PA-C  ibuprofen (ADVIL,MOTRIN) 600 MG tablet Take 1 tablet (600 mg total) by mouth every 6 (six) hours as needed. Patient not taking: Reported on 03/24/2016 12/22/15   Nona Dell, PA-C  iron polysaccharides (NIFEREX) 150 MG capsule Take 1 capsule (150 mg total) by mouth daily. Patient not taking: Reported on 12/14/2015 07/10/15   Maren Reamer, MD  megestrol (MEGACE) 20 MG tablet Take 2 tablets 3 times per day Patient not taking: Reported on 12/14/2015 07/20/15   Kathie Dike Leftwich-Kirby, CNM  megestrol (MEGACE) 40 MG tablet Take 3 tablets daily for 3 days,  2 tablets daily for 3 days, then 1 tablet daily Patient not taking: Reported on 12/14/2015 07/20/15   Kathie Dike Leftwich-Kirby, CNM  methocarbamol (ROBAXIN) 500 MG tablet Take 1 tablet (500 mg total) by mouth 2 (two) times daily. Patient not taking: Reported on 03/24/2016 12/22/15   Nona Dell, PA-C  pantoprazole (PROTONIX) 40 MG tablet Take 1 tablet (40 mg total) by mouth daily. Patient not taking: Reported on 12/14/2015 07/10/15   Maren Reamer, MD  topiramate (TOPAMAX) 50 MG tablet Take 1 tablet (50 mg total) by mouth at bedtime. Patient not taking:  Reported on 12/14/2015 07/10/15   Maren Reamer, MD  Vitamin D, Ergocalciferol, (DRISDOL) 50000 units CAPS capsule Take 1 capsule (50,000 Units total) by mouth every 7 (seven) days. Patient not taking: Reported on 12/14/2015 07/11/15   Maren Reamer, MD    Family History Family History  Problem Relation Age of Onset  . Cancer Mother   . Heart disease Mother   . Stroke Mother   . Hypertension Mother   . Heart disease Father     Social History Social History  Substance Use Topics  . Smoking status: Never Smoker  . Smokeless tobacco: Never Used  . Alcohol use No     Allergies   Celebrex [celecoxib] and Oxycontin [oxycodone hcl]   Review of Systems Review of Systems  Constitutional: Positive for chills.  Cardiovascular: Negative for chest pain.  Gastrointestinal: Positive for nausea and vomiting.  Genitourinary: Positive for dysuria, flank pain, frequency and urgency.  All other systems reviewed and are negative.    Physical Exam Updated Vital Signs BP 123/78 (BP Location: Right Arm)   Pulse 95   Temp 98.2 F (36.8 C) (Oral)   Resp 20   SpO2 100%   Physical Exam CONSTITUTIONAL: Well developed/well nourished, anxious HEAD: Normocephalic/atraumatic EYES: EOMI/PERRL ENMT: Mucous membranes moist NECK: supple no meningeal signs SPINE/BACK:entire spine nontender CV: S1/S2 noted, no murmurs/rubs/gallops noted LUNGS: Lungs are clear to auscultation bilaterally, no apparent distress ABDOMEN: soft, mild diffuse tenderness, no rebound or guarding, bowel sounds noted throughout abdomen, she is obese No abdominal hernia noted [id number] cva tenderness NEURO: Pt is awake/alert/appropriate, moves all extremitiesx4.  No facial droop.   EXTREMITIES: pulses normal/equal, full ROM SKIN: warm, color normal PSYCH: anxious  ED Treatments / Results  Labs (all labs ordered are listed, but only abnormal results are displayed) Labs Reviewed  URINALYSIS, ROUTINE W REFLEX  MICROSCOPIC - Abnormal; Notable for the following:       Result Value   Color, Urine AMBER (*)    APPearance CLOUDY (*)    Hgb urine dipstick LARGE (*)    Protein, ur 100 (*)    Nitrite POSITIVE (*)    Leukocytes, UA LARGE (*)    Bacteria, UA RARE (*)    Squamous Epithelial / LPF 0-5 (*)    All other components within normal limits  BASIC METABOLIC PANEL - Abnormal; Notable for the following:    Glucose, Bld 128 (*)    Calcium 8.8 (*)    All other components within normal limits  CBC WITH DIFFERENTIAL/PLATELET - Abnormal; Notable for the following:    WBC 14.5 (*)    Neutro Abs 10.8 (*)    Monocytes Absolute 1.2 (*)    All other components within normal limits  URINE CULTURE  PREGNANCY, URINE    EKG  EKG Interpretation None       Radiology No results found.  Procedures Procedures  Medications Ordered in ED Medications  cefTRIAXone (ROCEPHIN) 1 g in dextrose 5 % 50 mL IVPB (0 g Intravenous Stopped 03/24/16 0622)  sodium chloride 0.9 % bolus 1,000 mL (0 mLs Intravenous Stopped 03/24/16 0702)  fentaNYL (SUBLIMAZE) injection 100 mcg (100 mcg Intravenous Given 03/24/16 0542)  ondansetron (ZOFRAN) injection 4 mg (4 mg Intravenous Given 03/24/16 0543)  fentaNYL (SUBLIMAZE) injection 100 mcg (100 mcg Intravenous Given 03/24/16 0705)  HYDROcodone-acetaminophen (NORCO/VICODIN) 5-325 MG per tablet 1 tablet (1 tablet Oral Given 03/24/16 [id number])     Initial Impression / Assessment and Plan / ED Course  I have reviewed the triage vital signs and the nursing notes.  Pertinent labs results that were available during my care of the patient were reviewed by me and considered in my medical decision making (see chart for details).     5:13 AM Pt here with UTI symptoms that have now progressed to back pain and also chils, concern for pyelonephritis IV medications ordered 7:48 AM Overall, pt improved She is awake/alert and not septic appearing She is afebrile No hypotension On  repeat exam, pt is resting comfortably, no focal abdominal tenderness  I feel she can be managed as outpatient for pyelonephritis Voucher/coupon for keflex given Short course of pain meds (Narcotic database reviewed and considered in decision making) We discussed strict return precautions Due to overall well appearing I feel imaging can be deferred at this time BP 129/69 (BP Location: Left Arm)   Pulse 106   Temp 99 F (37.2 C) (Oral)   Resp 20   SpO2 94%   Final Clinical Impressions(s) / ED Diagnoses   Final diagnoses:  Pyelonephritis    New Prescriptions New Prescriptions   CEPHALEXIN (KEFLEX) 500 MG CAPSULE    Take 1 capsule (500 mg total) by mouth 4 (four) times daily.   HYDROCODONE-ACETAMINOPHEN (NORCO/VICODIN) 5-325 MG TABLET    Take 1 tablet by mouth every 6 (six) hours as needed.     Ripley Fraise, MD 03/24/16 [phone]

## 2016-03-24 NOTE — ED Notes (Signed)
Pt given goodrx discount coupon for antibiotic with discharge paperwork.

## 2016-03-24 NOTE — ED Notes (Signed)
Patient advised that has been taking Phenazopyridine Hydrochloride x2 days and took 2000mg  of tylenol at 2230 last night.

## 2016-03-24 NOTE — ED Notes (Signed)
Per [name] CM discharge pt and verbalizes will speak with pt at later time regarding medications.

## 2016-03-24 NOTE — Progress Notes (Signed)
CSW consult acknowledged re "antibiotic med assistance". CSW has deferred to RN Case Manager for all medication assistance needs. CSW signing off. Please consult should new need(s) arise.    Lorrine Kin, MSW, LCSW [facility]/66[facility] Clinical Social Worker [phone]

## 2016-03-24 NOTE — ED Notes (Signed)
Pt awaiting SW consult.

## 2016-03-24 NOTE — ED Notes (Signed)
[place] at bedside 

## 2016-03-24 NOTE — ED Triage Notes (Signed)
Pt c/o lower abdominal pain.  +urgency/frequency.  Per pt she started an OTC tx for UTI 2 days ago w/o relief of sx.  Per pt she feels that she has been unable to empty her bladder. Pt rates pain 8/10.

## 2016-03-25 ENCOUNTER — Telehealth: Payer: Self-pay | Admitting: *Deleted

## 2016-03-26 ENCOUNTER — Telehealth: Payer: Self-pay | Admitting: *Deleted

## 2016-03-26 LAB — URINE CULTURE: Culture: >=100000 — AB

## 2016-03-27 ENCOUNTER — Telehealth (HOSPITAL_BASED_OUTPATIENT_CLINIC_OR_DEPARTMENT_OTHER): Payer: Self-pay

## 2016-03-27 NOTE — Telephone Encounter (Signed)
Post ED Visit - Positive Culture Follow-up  Culture report reviewed by antimicrobial stewardship pharmacist:  []  Elenor Quinones, Pharm.D. []  Heide Guile, Pharm.D., BCPS []  Parks Neptune, Pharm.D. []  Alycia Rossetti, Pharm.D., BCPS []  [place], [place].D., BCPS, AAHIVP []  Legrand Como, Pharm.D., BCPS, AAHIVP []  Milus Glazier, Pharm.D. []  Stephens November, [place].DWandalee Ferdinand Arminger, Pharm.D.  Positive urine culture>/= 100,000 colonies -> E Coli Treated with Cephalexin, organism sensitive to the same and no further patient follow-up is required at this time.  Dortha Kern 03/27/2016, 10:44 AM

## 2016-07-10 ENCOUNTER — Encounter: Payer: Self-pay | Admitting: Internal Medicine

## 2016-07-11 ENCOUNTER — Encounter (HOSPITAL_COMMUNITY): Payer: Self-pay | Admitting: *Deleted

## 2016-07-11 ENCOUNTER — Encounter: Payer: Self-pay | Admitting: Internal Medicine

## 2016-07-11 ENCOUNTER — Inpatient Hospital Stay (HOSPITAL_COMMUNITY): Payer: Self-pay

## 2016-07-11 ENCOUNTER — Inpatient Hospital Stay (HOSPITAL_COMMUNITY)
Admission: AD | Admit: 2016-07-11 | Discharge: 2016-07-11 | Disposition: A | Discharge status: Home / Self Care | Payer: Self-pay | Source: Ambulatory Visit | Attending: Obstetrics & Gynecology | Admitting: Obstetrics & Gynecology

## 2016-07-11 DIAGNOSIS — K760 Fatty (change of) liver, not elsewhere classified: Secondary | ICD-10-CM

## 2016-07-11 DIAGNOSIS — M549 Dorsalgia, unspecified: Secondary | ICD-10-CM | POA: Insufficient documentation

## 2016-07-11 DIAGNOSIS — R1032 Left lower quadrant pain: Secondary | ICD-10-CM

## 2016-07-11 DIAGNOSIS — G8929 Other chronic pain: Secondary | ICD-10-CM | POA: Insufficient documentation

## 2016-07-11 DIAGNOSIS — K8020 Calculus of gallbladder without cholecystitis without obstruction: Secondary | ICD-10-CM

## 2016-07-11 DIAGNOSIS — R109 Unspecified abdominal pain: Secondary | ICD-10-CM | POA: Insufficient documentation

## 2016-07-11 DIAGNOSIS — M797 Fibromyalgia: Secondary | ICD-10-CM | POA: Insufficient documentation

## 2016-07-11 DIAGNOSIS — F419 Anxiety disorder, unspecified: Secondary | ICD-10-CM | POA: Insufficient documentation

## 2016-07-11 DIAGNOSIS — R160 Hepatomegaly, not elsewhere classified: Secondary | ICD-10-CM

## 2016-07-11 DIAGNOSIS — K5792 Diverticulitis of intestine, part unspecified, without perforation or abscess without bleeding: Secondary | ICD-10-CM

## 2016-07-11 LAB — COMPREHENSIVE METABOLIC PANEL
ALT: 16 U/L (ref 14–54)
AST: 19 U/L (ref 15–41)
Albumin: 3 g/dL — ABNORMAL LOW (ref 3.5–5.0)
Alkaline Phosphatase: 86 U/L (ref 38–126)
Anion gap: 7 (ref 5–15)
BUN: 11 mg/dL (ref 6–20)
CO2: 24 mmol/L (ref 22–32)
Calcium: 8.3 mg/dL — ABNORMAL LOW (ref 8.9–10.3)
Chloride: 104 mmol/L (ref 101–111)
Creatinine, Ser: 0.61 mg/dL (ref 0.44–1.00)
GFR calc Af Amer: >60 mL/min (ref >60)
GFR calc non Af Amer: >60 mL/min (ref >60)
Glucose, Bld: 113 mg/dL — ABNORMAL HIGH (ref 65–99)
Potassium: 3.6 mmol/L (ref 3.5–5.1)
Sodium: 135 mmol/L (ref 135–145)
Total Bilirubin: 0.5 mg/dL (ref 0.3–1.2)
Total Protein: 6.6 g/dL (ref 6.5–8.1)

## 2016-07-11 LAB — URINALYSIS, ROUTINE W REFLEX MICROSCOPIC
Bilirubin Urine: NEGATIVE
Glucose, UA: NEGATIVE mg/dL
Ketones, ur: NEGATIVE mg/dL
Leukocytes, UA: NEGATIVE
Nitrite: NEGATIVE
Protein, ur: NEGATIVE mg/dL
Specific Gravity, Urine: 1.025 (ref 1.005–1.030)
pH: 6 (ref 5.0–8.0)

## 2016-07-11 LAB — CBC WITH DIFFERENTIAL/PLATELET
Basophils Absolute: 0 10*3/uL (ref 0.0–0.1)
Basophils Relative: 0 %
Eosinophils Absolute: 0.1 10*3/uL (ref 0.0–0.7)
Eosinophils Relative: 1 %
HCT: 40 % (ref 36.0–46.0)
Hemoglobin: 13.3 g/dL (ref 12.0–15.0)
Lymphocytes Relative: 36 %
Lymphs Abs: 3 10*3/uL (ref 0.7–4.0)
MCH: 28.3 pg (ref 26.0–34.0)
MCHC: 33.3 g/dL (ref 30.0–36.0)
MCV: 85.1 fL (ref 78.0–100.0)
Monocytes Absolute: 0.4 10*3/uL (ref 0.1–1.0)
Monocytes Relative: 5 %
Neutro Abs: 4.8 10*3/uL (ref 1.7–7.7)
Neutrophils Relative %: 58 %
Platelets: 326 10*3/uL (ref 150–400)
RBC: 4.7 MIL/uL (ref 3.87–5.11)
RDW: 14.2 % (ref 11.5–15.5)
WBC: 8.3 10*3/uL (ref 4.0–10.5)

## 2016-07-11 LAB — URINALYSIS, MICROSCOPIC (REFLEX): RBC / HPF: NONE SEEN RBC/hpf (ref 0–5)

## 2016-07-11 LAB — LIPASE, BLOOD: Lipase: 18 U/L (ref 11–51)

## 2016-07-11 LAB — POCT PREGNANCY, URINE: Preg Test, Ur: NEGATIVE

## 2016-07-11 MED ORDER — GI COCKTAIL ~~LOC~~
30.0000 mL | Freq: Once | ORAL | Status: AC
  Administered 2016-07-11: 30 mL via ORAL
  Filled 2016-07-11: qty 30

## 2016-07-11 MED ORDER — HYDROMORPHONE HCL 1 MG/ML IJ SOLN
1.0000 mg | INTRAMUSCULAR | Status: DC | PRN
  Administered 2016-07-11: 1 mg via INTRAVENOUS
  Filled 2016-07-11: qty 1

## 2016-07-11 MED ORDER — LACTATED RINGERS IV SOLN
INTRAVENOUS | Status: DC
  Administered 2016-07-11: 16:00:00 via INTRAVENOUS

## 2016-07-11 MED ORDER — PROMETHAZINE HCL 25 MG/ML IJ SOLN
25.0000 mg | Freq: Once | INTRAMUSCULAR | Status: AC
  Administered 2016-07-11: 25 mg via INTRAVENOUS
  Filled 2016-07-11: qty 1

## 2016-07-11 MED ORDER — IOPAMIDOL (ISOVUE-300) INJECTION 61%: 30.0000 mL | Freq: Once | INTRAVENOUS | Status: DC | PRN

## 2016-07-11 MED ORDER — IOPAMIDOL (ISOVUE-300) INJECTION 61%
100.0000 mL | Freq: Once | INTRAVENOUS | Status: AC | PRN
  Administered 2016-07-11: 100 mL via INTRAVENOUS

## 2016-07-11 MED ORDER — HYDROMORPHONE HCL 2 MG PO TABS: 2.0000 mg | ORAL_TABLET | ORAL | 0 refills | Status: DC | PRN

## 2016-07-11 MED ORDER — HYDROMORPHONE HCL 2 MG PO TABS
2.0000 mg | ORAL_TABLET | Freq: Once | ORAL | Status: AC
  Administered 2016-07-11: 2 mg via ORAL
  Filled 2016-07-11: qty 1

## 2016-07-11 MED ORDER — CIPROFLOXACIN HCL 500 MG PO TABS
500.0000 mg | ORAL_TABLET | Freq: Once | ORAL | Status: AC
  Administered 2016-07-11: 500 mg via ORAL
  Filled 2016-07-11: qty 1

## 2016-07-11 MED ORDER — CIPROFLOXACIN HCL 500 MG PO TABS: 500.0000 mg | ORAL_TABLET | Freq: Two times a day (BID) | ORAL | 0 refills | Status: DC

## 2016-07-11 MED ORDER — METRONIDAZOLE 500 MG PO TABS
500.0000 mg | ORAL_TABLET | Freq: Once | ORAL | Status: AC
  Administered 2016-07-11: 500 mg via ORAL
  Filled 2016-07-11: qty 1

## 2016-07-11 MED ORDER — PROMETHAZINE HCL 25 MG PO TABS: 25.0000 mg | ORAL_TABLET | Freq: Four times a day (QID) | ORAL | 2 refills | Status: DC | PRN

## 2016-07-11 MED ORDER — METRONIDAZOLE 500 MG PO TABS: 500.0000 mg | ORAL_TABLET | Freq: Two times a day (BID) | ORAL | 0 refills | Status: DC

## 2016-07-11 NOTE — Progress Notes (Signed)
Called by Hansel Feinstein , CNM working under Dr Harolyn Rutherford at [facility] for a consult on possible hospital admission.  51 year old female with history of chronic back pain and fibromyalgia who presented to [facility] ED this morning with left lower quadrant pain radiating to the flank since last 4 days. She reported her symptoms to have gotten worse with associated nausea but no vomiting. Pain better on lying down and curled up. No improvement with Tylenol. Denied any fever, chills, dysuria. Had one episode of diarrhea yesterday. No recent illness or travel. No sick contacts. Appendectomy done several years ago.  Vitals reviewed, stable and afebrile. Blood work done today shows normal WBC (8.3), normal hemoglobin, platelets and electrolytes.  Abdominal x-ray done showed dilated left lower quadrant with possible ileus. Patient has received 1 mg IV Dilaudid, GI cocktail and Phenergan along with IV RL.  Since her pain was persistent a CT scan of the abdomen and pelvis with contrast was done which showed non-complicated descending colonic diverticulitis, hepatic steatosis, cholelithiasis. Also comments on jejunal mesenteric finding likely representing mesenteric adenitis/panniculitis. No signs of ileus or obstruction on CT scan.   Recommendations Patient is afebrile, with stable vitals and normal white count. She is able to tolerate by mouth) has not had any vomiting or further diarrhea. CT of the abdomen showing acute diverticulitis without any complication. As per primary team patient does not appear dehydrated and does not have any problem tolerating food or medications by mouth.  She does not need to be admitted to the hospital either for IV antibiotics, IV hydration or IV pain medications and this can be managed in the outpatient setting for now.  I have recommended her to be discharged on oral ciprofloxacin 500 mg every 12 hours for 7 days and oral metronidazole 500 mg every 8 hours  for 7 days. Also instructed to provider with oral pain medications (tramadol or Vicodin when necessary) along with as needed Zofran for nausea.   Patient should be instructed to return to the ED if she develops fever, abdominal distention, worsened abdominal pain, vomiting, persistent diarrhea.

## 2016-07-11 NOTE — MAU Provider Note (Signed)
Chief Complaint:  Abdominal Pain   First Provider Initiated Contact with Patient 07/11/16 1128      HPI: Donna [name] is a 51 y.o. [id number] who presents to maternity admissions reporting pain in left middle and lower abdomen for 5 days.  Has been nauseated all week.  Had diarrhea yesterday but reports no constipation or diarrhea other days.  Has not had a BM today.  Has never had pain like this before.. She reports vaginal bleeding, vaginal itching/burning, urinary symptoms, h/a, dizziness, n/v, or fever/chills.   Does not have an appendix  Abdominal Pain  This is a new problem. The current episode started in the past 7 days. The onset quality is undetermined. The problem occurs constantly. The problem has been gradually worsening. The pain is located in the left flank, LLQ, LUQ and generalized abdominal region. The pain is severe. The quality of the pain is aching, colicky, cramping and sharp. The abdominal pain radiates to the back and left flank. Associated symptoms include diarrhea (once yesterday) and nausea. Pertinent negatives include no constipation, fever, headaches, myalgias or vomiting. The pain is aggravated by palpation, movement, coughing, certain positions and deep breathing. The pain is relieved by being still. She has tried acetaminophen for the symptoms. The treatment provided no relief.  Diarrhea   This is a new problem. The current episode started yesterday. The problem occurs less than 2 times per day. The problem has been resolved. The patient states that diarrhea does not awaken her from sleep. Associated symptoms include abdominal pain. Pertinent negatives include no fever, headaches, myalgias or vomiting. There are no known risk factors. She has tried nothing for the symptoms.    RN Note: C/o L flank pain since Monday; pain has progressively gotten worse since Monday; had diarrhea yesterday; states that pain is causing nausea but pt is not vomiting; Pain is slightly better  when she is lying down and curled up Been having abd pain since Monday, left /mid side ; radiates around to back and up to ribs.  As long as she is still it is ok.  Has been  Spotting off and on.  Tylenol is not touching her pain. "Can almost feel a bulge under her skin"  Past Medical History: Past Medical History:  Diagnosis Date  . Anxiety   . Chronic back pain   . Fibromyalgia     Past obstetric history: OB History  Gravida Para Term Preterm AB Living  3 2 2  0 1 2  SAB TAB Ectopic Multiple Live Births  1 0 0 0 2    # Outcome Date GA Lbr Len/2nd Weight Sex Delivery Anes PTL Lv  3 SAB           2 Term      Vag-Spont   LIV  1 Term      Vag-Spont   LIV      Past Surgical History: Past Surgical History:  Procedure Laterality Date  . APPENDECTOMY    . BACK SURGERY    . lumbar  fusions    . PERINEAL WOUND CLOSURE      Family History: Family History  Problem Relation Age of Onset  . Cancer Mother   . Heart disease Mother   . Stroke Mother   . Hypertension Mother   . Heart disease Father     Social History: Social History  Substance Use Topics  . Smoking status: Never Smoker  . Smokeless tobacco: Never Used  . Alcohol use No  Allergies:  Allergies  Allergen Reactions  . Celebrex [Celecoxib] Other (See Comments)    Facial edema   . Oxycontin [Oxycodone Hcl] Other (See Comments)    Hallucinations.  Has taken morphine and other pain meds and is fine.    Meds:  Prescriptions Prior to Admission  Medication Sig Dispense Refill Last Dose  . acetaminophen (TYLENOL) 500 MG tablet Take 2 tablets (1,000 mg total) by mouth every 6 (six) hours as needed (for migraines). Headache (Patient taking differently: Take 1,000 mg by mouth every 6 (six) hours as needed for mild pain or moderate pain (for migraines). Headache) 30 tablet 0 07/10/2016 at Unknown time  . HYDROcodone-acetaminophen (NORCO/VICODIN) 5-325 MG tablet Take 1 tablet by mouth every 6 (six) hours as needed.  (Patient not taking: Reported on 07/11/2016) 4 tablet 0 Not Taking at Unknown time    I have reviewed patient's Past Medical Hx, Surgical Hx, Family Hx, Social Hx, medications and allergies.  ROS:  Review of Systems  Constitutional: Negative for fever.  Gastrointestinal: Positive for abdominal pain, diarrhea (once yesterday) and nausea. Negative for constipation and vomiting.  Musculoskeletal: Negative for myalgias.  Neurological: Negative for headaches.   Other systems negative     Physical Exam  Patient Vitals for the past 24 hrs:  BP Temp Temp src Pulse Resp SpO2  07/11/16 1028 118/75 98 F (36.7 C) Oral 81 20 100 %   Constitutional: Well-developed, well-nourished female in apparent considerable pain.    Cardiovascular: normal rate and rhythm, no ectopy audible, S1 & S2 heard, no murmur Respiratory: normal effort, no distress. Lungs CTAB with no wheezes or crackles GI: Abd soft, and diffusely tender, with focal tenderness over left Mid-abdomen.  There is also epigastric tenderness.    Abdomen is generally distended.  No rebound, but there is guarding.  Bowel Sounds minimally audible  MS: Extremities nontender, no edema, normal ROM Neurologic: Alert and oriented x 4.   Grossly nonfocal. GU: Neg CVAT. Skin:  Warm and Dry Psych:  Affect appropriate.  PELVIC EXAM: scant white creamy discharge, vaginal walls and external genitalia normal Bimanual exam: Cervix firm, anterior, neg CMT, uterus nontender, nonenlarged, adnexa with general bilateral tenderness, but unable to assess for enlargement, or mass due to habitus, which limits exam.    Labs: Results for orders placed or performed during the hospital encounter of 07/11/16 (from the past 24 hour(s))  Urinalysis, Routine w reflex microscopic     Status: Abnormal   Collection Time: 07/11/16 10:30 AM  Result Value Ref Range   Color, Urine YELLOW YELLOW   APPearance CLEAR CLEAR   Specific Gravity, Urine 1.025 1.005 - 1.030   pH  6.0 5.0 - 8.0   Glucose, UA NEGATIVE NEGATIVE mg/dL   Hgb urine dipstick TRACE (A) NEGATIVE   Bilirubin Urine NEGATIVE NEGATIVE   Ketones, ur NEGATIVE NEGATIVE mg/dL   Protein, ur NEGATIVE NEGATIVE mg/dL   Nitrite NEGATIVE NEGATIVE   Leukocytes, UA NEGATIVE NEGATIVE  Urinalysis, Microscopic (reflex)     Status: Abnormal   Collection Time: 07/11/16 10:30 AM  Result Value Ref Range   RBC / HPF NONE SEEN 0 - 5 RBC/hpf   WBC, UA 0-5 0 - 5 WBC/hpf   Bacteria, UA RARE (A) NONE SEEN   Squamous Epithelial / LPF 0-5 (A) NONE SEEN  Pregnancy, urine POC     Status: None   Collection Time: 07/11/16 10:42 AM  Result Value Ref Range   Preg Test, Ur NEGATIVE NEGATIVE  CBC with  Differential/Platelet     Status: None   Collection Time: 07/11/16 11:30 AM  Result Value Ref Range   WBC 8.3 4.0 - 10.5 K/uL   RBC 4.70 3.87 - 5.11 MIL/uL   Hemoglobin 13.3 12.0 - 15.0 g/dL   HCT 40.0 36.0 - 46.0 %   MCV 85.1 78.0 - 100.0 fL   MCH 28.3 26.0 - 34.0 pg   MCHC 33.3 30.0 - 36.0 g/dL   RDW 14.2 11.5 - 15.5 %   Platelets 326 150 - 400 K/uL   Neutrophils Relative % 58 %   Neutro Abs 4.8 1.7 - 7.7 K/uL   Lymphocytes Relative 36 %   Lymphs Abs 3.0 0.7 - 4.0 K/uL   Monocytes Relative 5 %   Monocytes Absolute 0.4 0.1 - 1.0 K/uL   Eosinophils Relative 1 %   Eosinophils Absolute 0.1 0.0 - 0.7 K/uL   Basophils Relative 0 %   Basophils Absolute 0.0 0.0 - 0.1 K/uL  Comprehensive metabolic panel     Status: Abnormal   Collection Time: 07/11/16 11:30 AM  Result Value Ref Range   Sodium 135 135 - 145 mmol/L   Potassium 3.6 3.5 - 5.1 mmol/L   Chloride 104 101 - 111 mmol/L   CO2 24 22 - 32 mmol/L   Glucose, Bld 113 (H) 65 - 99 mg/dL   BUN 11 6 - 20 mg/dL   Creatinine, Ser 0.61 0.44 - 1.00 mg/dL   Calcium 8.3 (L) 8.9 - 10.3 mg/dL   Total Protein 6.6 6.5 - 8.1 g/dL   Albumin 3.0 (L) 3.5 - 5.0 g/dL   AST 19 15 - 41 U/L   ALT 16 14 - 54 U/L   Alkaline Phosphatase 86 38 - 126 U/L   Total Bilirubin 0.5 0.3 -  1.2 mg/dL   GFR calc non Af Amer >60 >60 mL/min   GFR calc Af Amer >60 >60 mL/min   Anion gap 7 5 - 15  Lipase, blood     Status: None   Collection Time: 07/11/16 11:30 AM  Result Value Ref Range   Lipase 18 11 - 51 U/L    Imaging:  No results found.  MAU Course/MDM: I have ordered labs as follows: See above. No leukocytosis or signs of hematuria or UTI.  Lipase normal Imaging ordered: Initially ordered an abdominal xray to evaluate for SBO or ileus. Consulted Dr Harolyn Rutherford who recommends CT due to level of pain and focal tenderness.   Results reviewed.   Per Abdominal Xray, there is concern for partial obstruction versus small bowel ileus.  CT results reviewed.  CT results show probable diverticulitis (see report) Consulted Inpatient MD for evaluation. He does not think she needs to be inpatient.  He recommends PO antibiotics and analgesia.      Treatments in MAU included one dose of GI cocktail due to epigastric discomfort on admission.  Dilaudid given for acute pain with moderate relief..  Second dose given when pain intensified.  Patient concerned about not having money for meds. Does not have insurance. Gets paid Tuesday.  Will give first doses here (Cipro and Flagyl), then  Coupon given for meds to make them more affordable.   MD Note: Patient is afebrile, with stable vitals and normal white count. She is able to tolerate by mouth) has not had any vomiting or further diarrhea. CT of the abdomen showing acute diverticulitis without any complication. As per primary team patient does not appear dehydrated and does not have any  problem tolerating food or medications by mouth.  She does not need to be admitted to the hospital either for IV antibiotics, IV hydration or IV pain medications and this can be managed in the outpatient setting for now.  I have recommended her to be discharged on oral ciprofloxacin 500 mg every 12 hours for 7 days and oral metronidazole 500 mg every 8 hours for  7 days. Also instructed to provider with oral pain medications (tramadol or Vicodin when necessary) along with as needed Zofran for nausea.   Patient should be instructed to return to the ED if she develops fever, abdominal distention, worsened abdominal pain, vomiting, persistent diarrhea.  Pt stable at time of discharge  Assessment: Acute abdominal pain, focally tender on left Probable diverticulitis   Plan: Discharge home Recommend Follow up with [facility] Rx sent for Cipro and Flagyl  for diverticulits Rx sent for Dilaudid po for pain Rx sent for Phenergan for nausea  Encouraged to return here or to other Urgent Care/ED if she develops worsening of symptoms, increase in pain, fever, or other concerning symptoms.   Hansel Feinstein CNM, MSN Certified Nurse-Midwife 07/11/2016 11:28 AM

## 2016-07-11 NOTE — MAU Note (Signed)
C/o L flank pain since Monday; pain has progressively gotten worse since Monday; had diarrhea yesterday; states that pain is causing nausea but pt is not vomiting; Pain is slightly better when she is lying down and curled up;

## 2016-07-11 NOTE — Discharge Instructions (Signed)

## 2016-07-11 NOTE — MAU Note (Addendum)
Been having abd pain since Monday, left /mid side ; radiates around to back and up to ribs.  As long as she is still it is ok.  Has been  Spotting off and on.  Tylenol is not touching her pain. "Can almost feel a bulge under her skin"

## 2016-07-14 ENCOUNTER — Encounter: Payer: Self-pay | Admitting: Internal Medicine

## 2017-04-07 IMAGING — US US TRANSVAGINAL NON-OB
1 series · 14 of 25 positions shown · non-contrast
Comparison: None available

CLINICAL DATA: Vaginal bleeding and cramping

EXAM:
TRANSABDOMINAL AND TRANSVAGINAL ULTRASOUND OF PELVIS
TECHNIQUE: Both transabdominal and transvaginal ultrasound examinations of the
pelvis were performed. Transabdominal technique was performed for
global imaging of the pelvis including uterus, ovaries, adnexal
regions, and pelvic cul-de-sac. It was necessary to proceed with
endovaginal exam following the transabdominal exam to visualize the
endometrium and adnexae and better detail.

[Series 1: us transvaginal non-ob · 0.33mm/px · 14 of 66 slices shown]
[im 1/66]
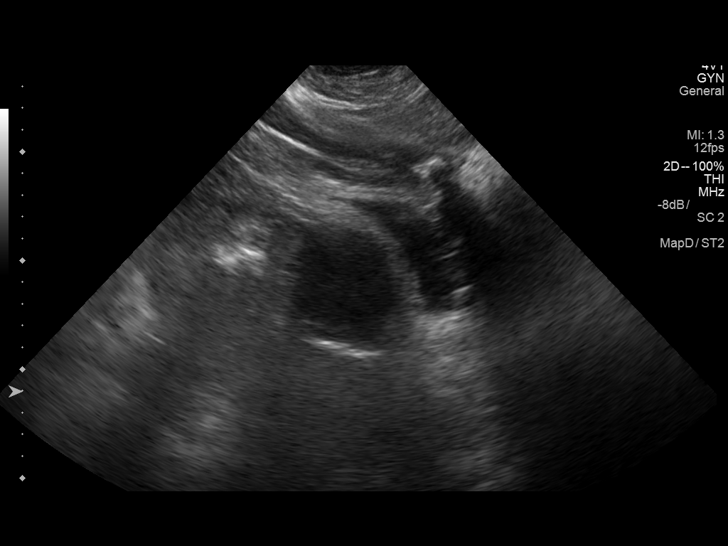
[im 6/66]
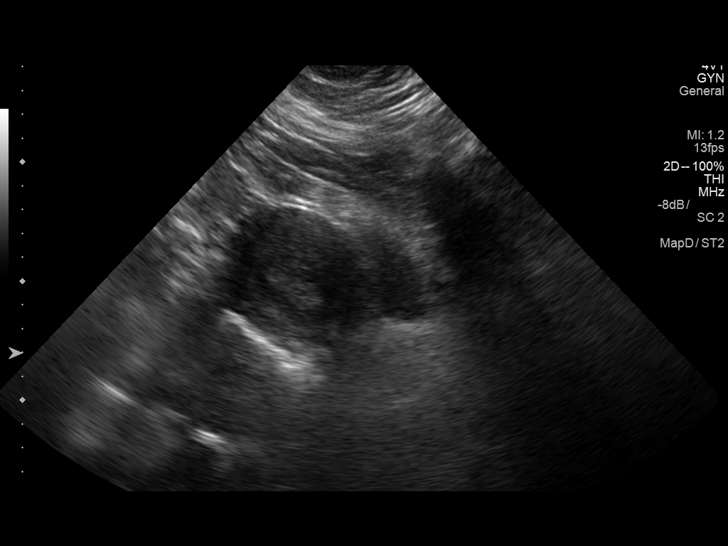
[im 11/66]
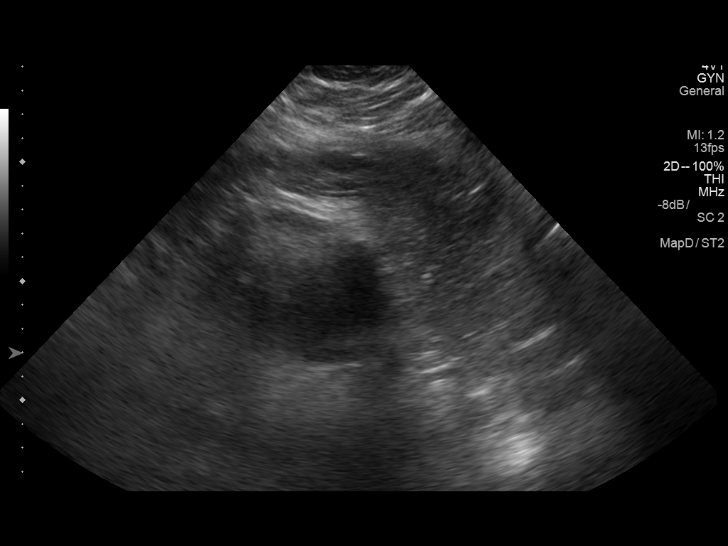
[im 17/66]
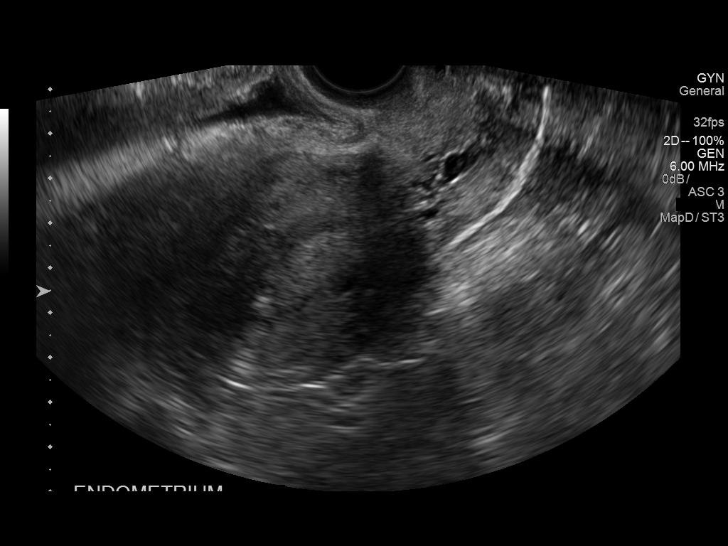
[im 22/66]
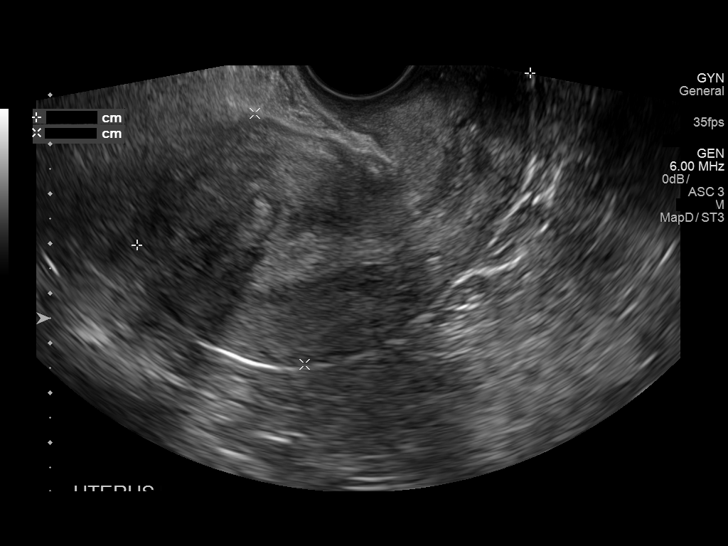
[im 25/66]
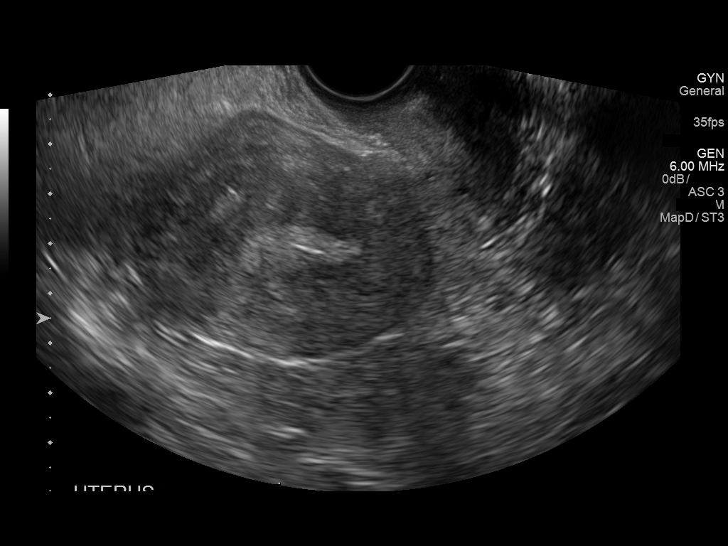
[im 30/66]
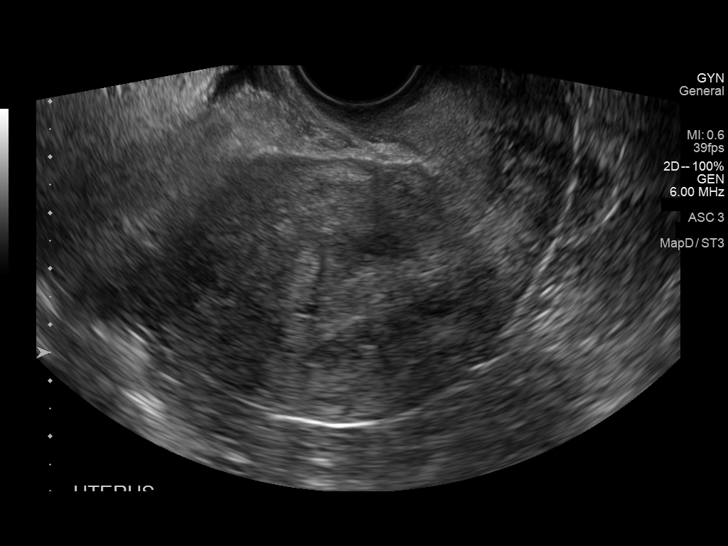
[im 36/66]
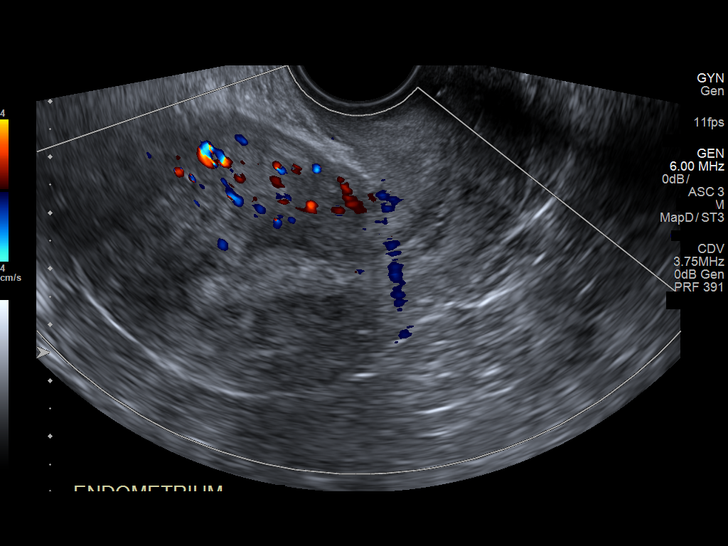
[im 41/66]
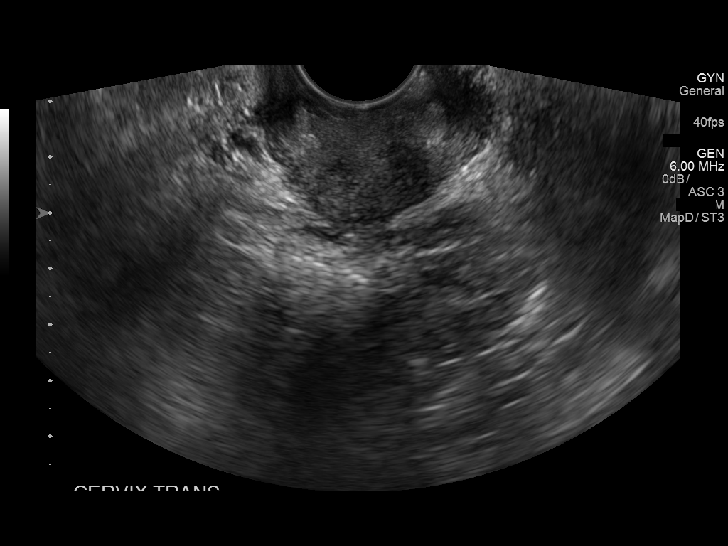
[im 44/66]
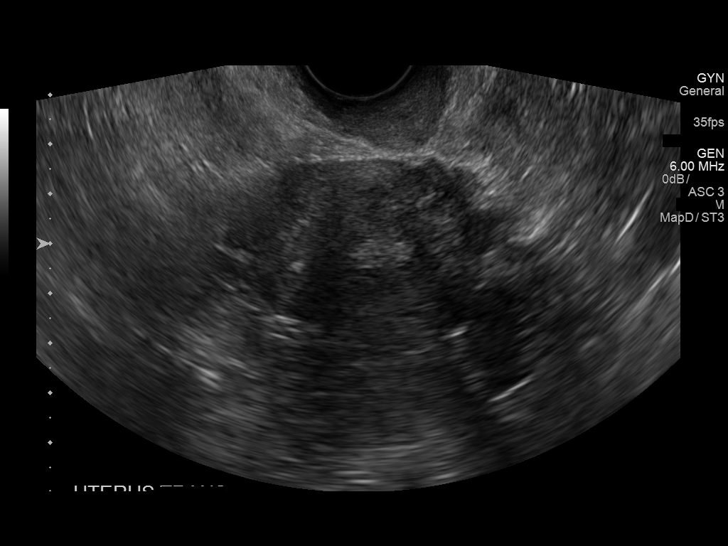
[im 49/66]
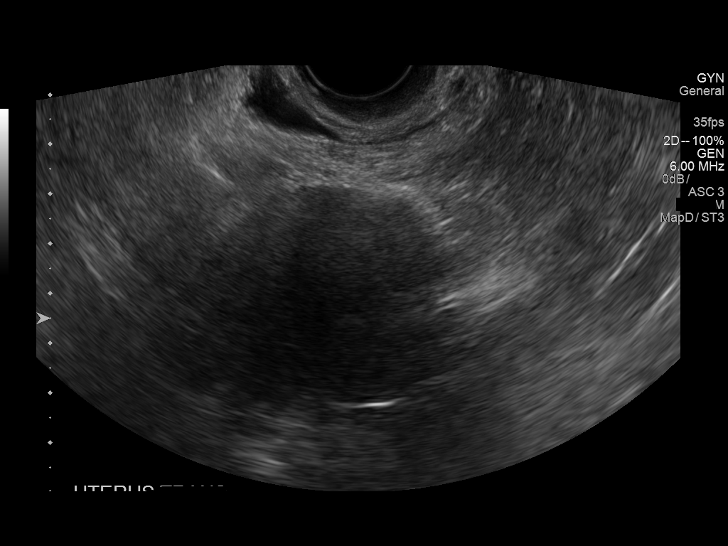
[im 55/66]
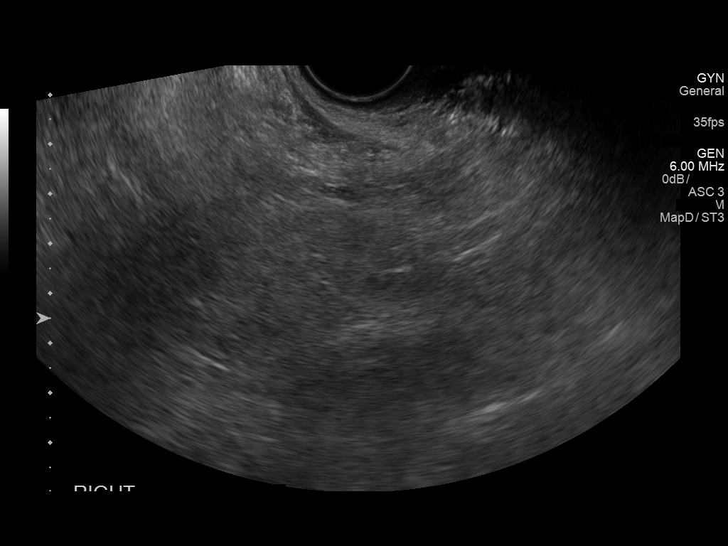
[im 60/66]
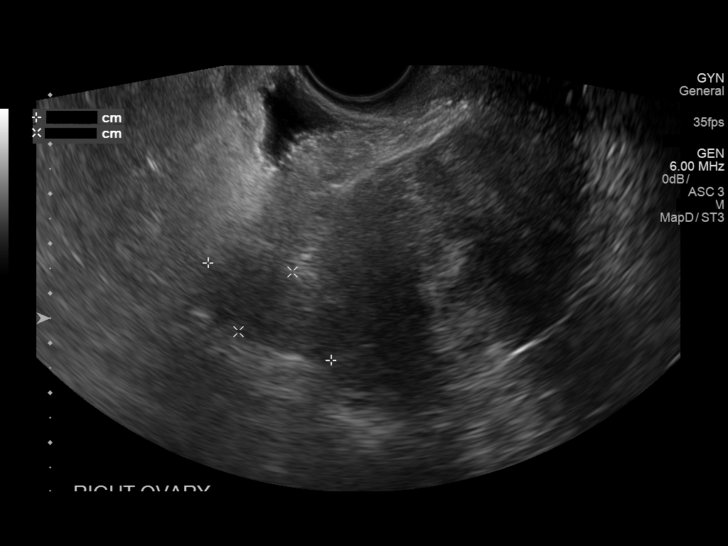
[im 66/66]
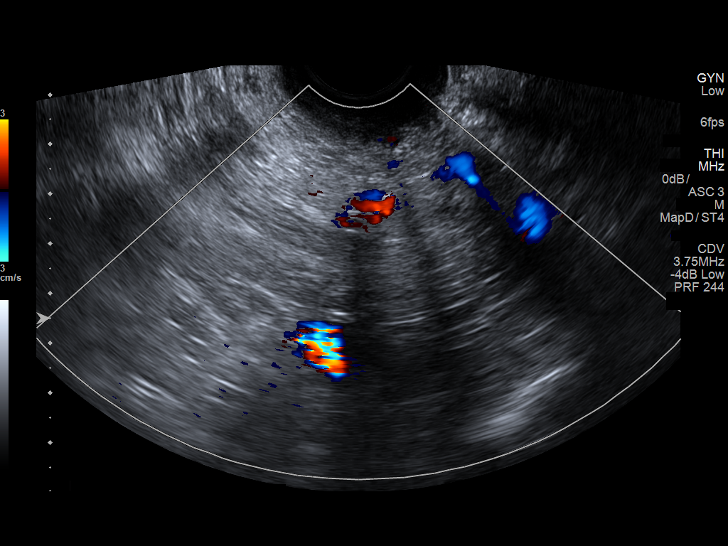

[14 of 25 positions shown; findings below may reference images not displayed]

FINDINGS: Uterus

Measurements: 9.3 x 4.8 x 4.4 cm. Several nabothian cyst noted. Mild
heterogeneous myometrium echotexture. No definite focal abnormality.

Endometrium

Thickness: 6 mm.  No focal abnormality visualized.

Right ovary

Measurements: 3.4 x 2.5 x 2.5 cm. Normal appearance/no adnexal mass.

Left ovary

Not visualized, obscured by bowel gas.

Other findings

No abnormal free fluid.
IMPRESSION: No acute finding by pelvic ultrasound. Study limited by body habitus
and bowel gas.

Nonvisualization of the left ovary

No free fluid.

## 2017-10-30 IMAGING — CT CT ANGIO CHEST
2 of 6 series · 19 of 36 positions shown · IV contrast (ISOVUE)
Comparison: None.

CLINICAL DATA: Left upper chest pain, shortness of breath with
exertion for 10 months. White cell count 15.8.

EXAM:
CT ANGIOGRAPHY CHEST WITH CONTRAST
TECHNIQUE: Multidetector CT imaging of the chest was performed using the
standard protocol during bolus administration of intravenous
contrast. Multiplanar CT image reconstructions and MIPs were
obtained to evaluate the vascular anatomy.
CONTRAST:  100 mL Isovue 370

[Series 5: thins for pacs · axial · 0.65mm/px · z∈[+1506,+1706]mm · 18 of 223 slices shown]
[im 12/223  lung]
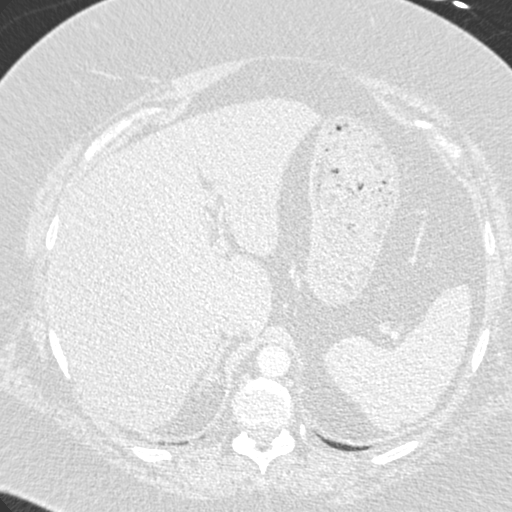
[im 23/223  mediastinal]
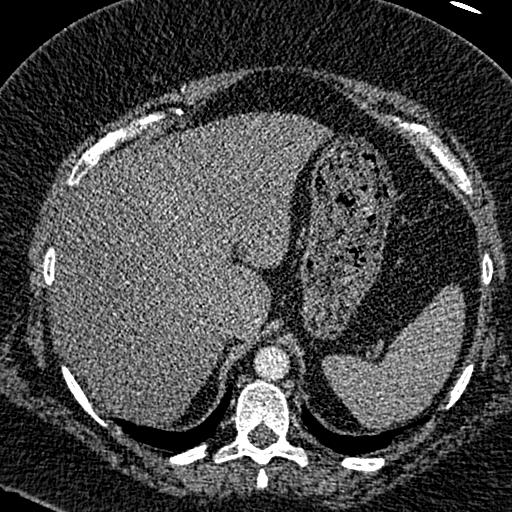
[im 34/223  lung]
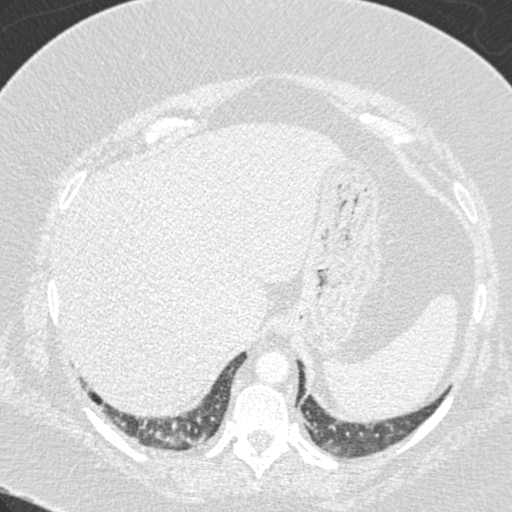
[im 45/223  mediastinal]
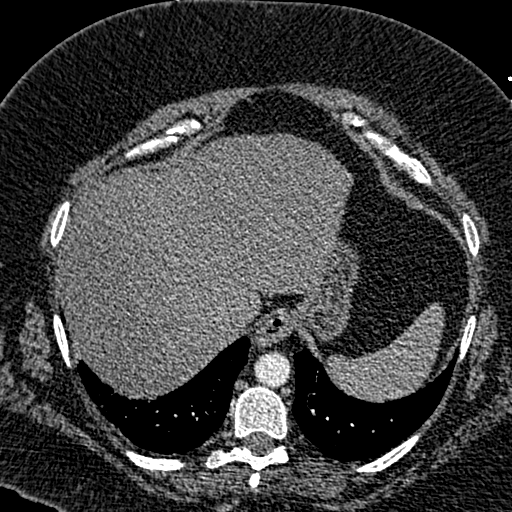
[im 56/223  lung]
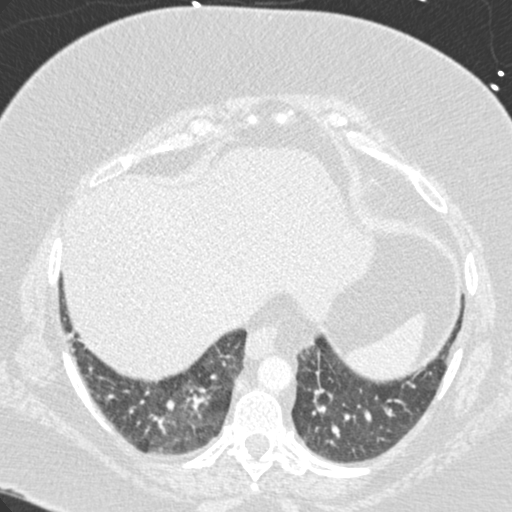
[im 67/223  mediastinal]
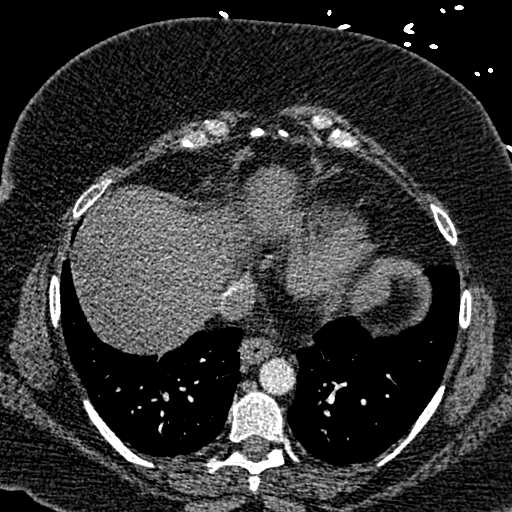
[im 78/223  lung]
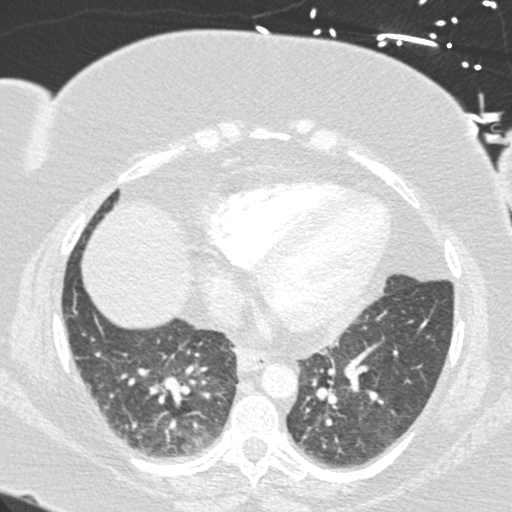
[im 89/223  mediastinal]
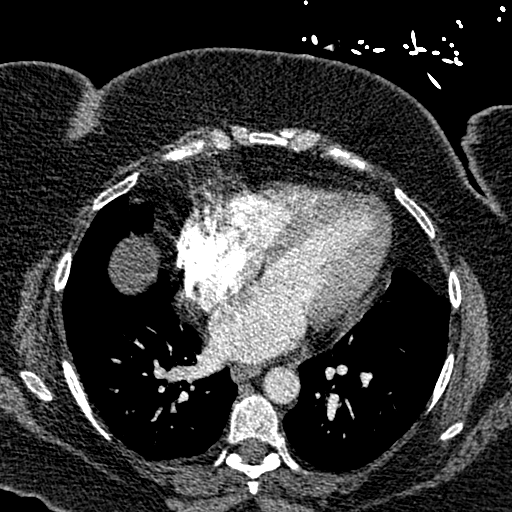
[im 100/223  lung]
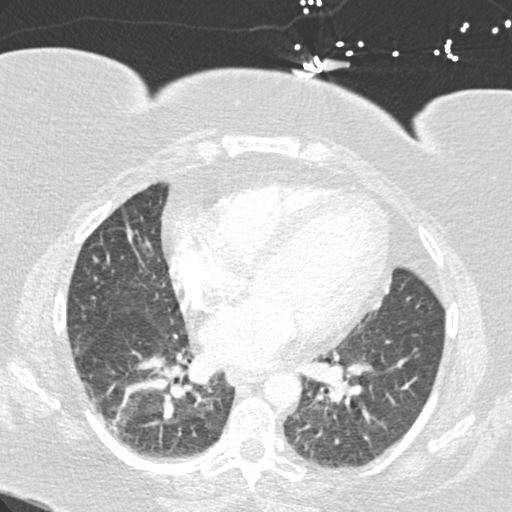
[im 123/223  mediastinal]
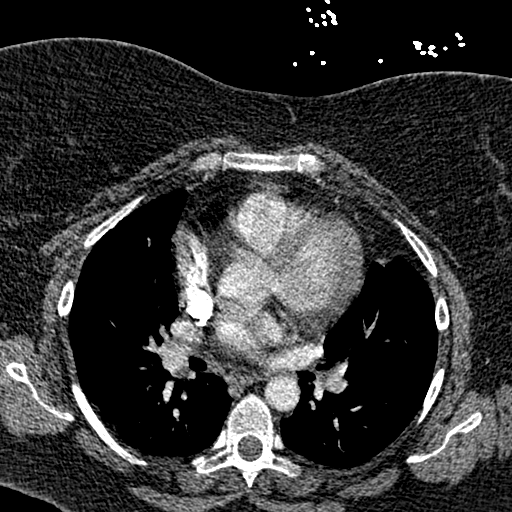
[im 134/223  lung]
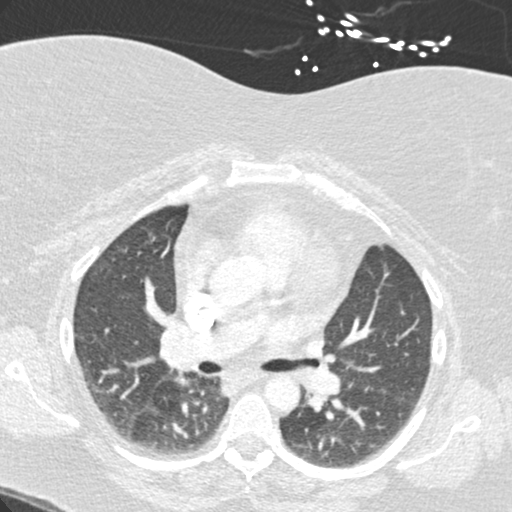
[im 145/223  mediastinal]
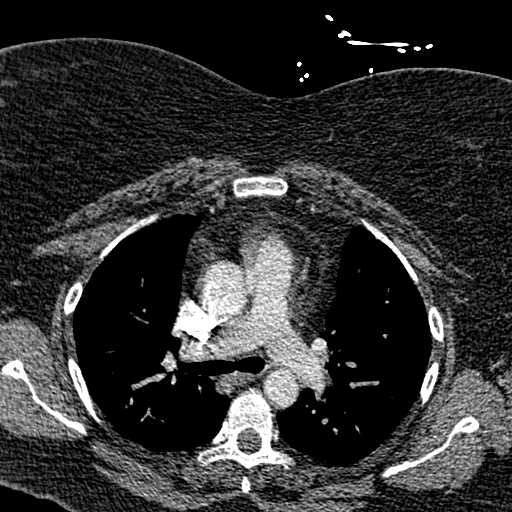
[im 156/223  lung]
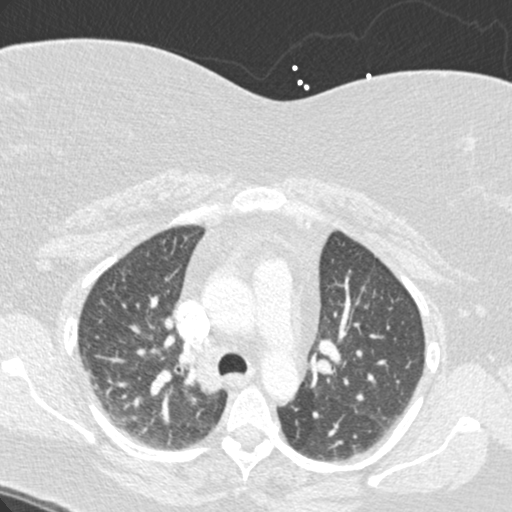
[im 167/223  mediastinal]
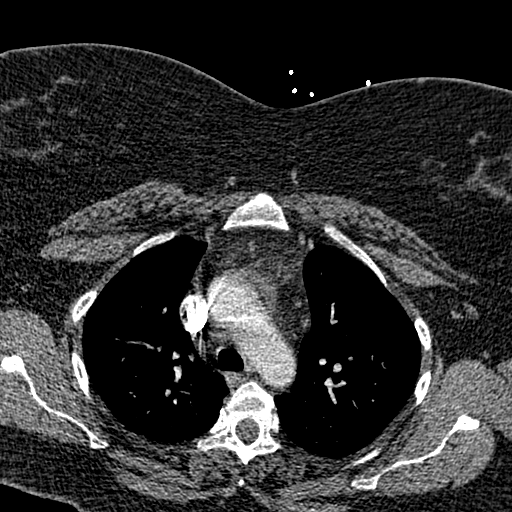
[im 178/223  lung]
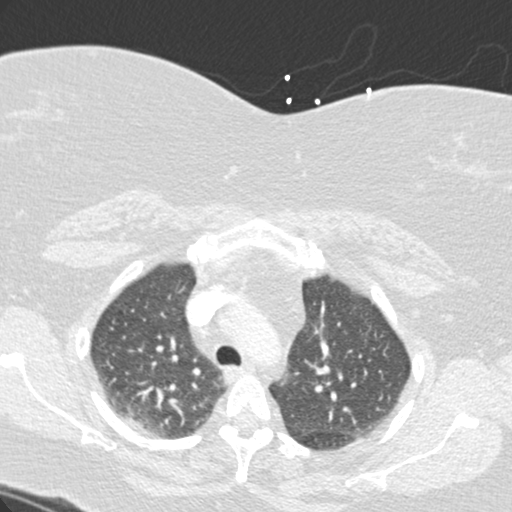
[im 189/223  mediastinal]
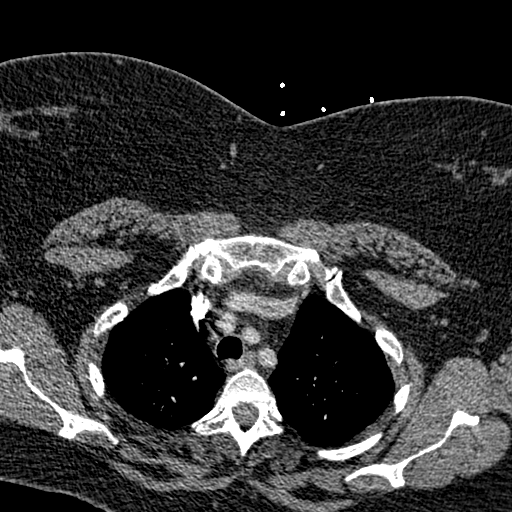
[im 200/223  lung]
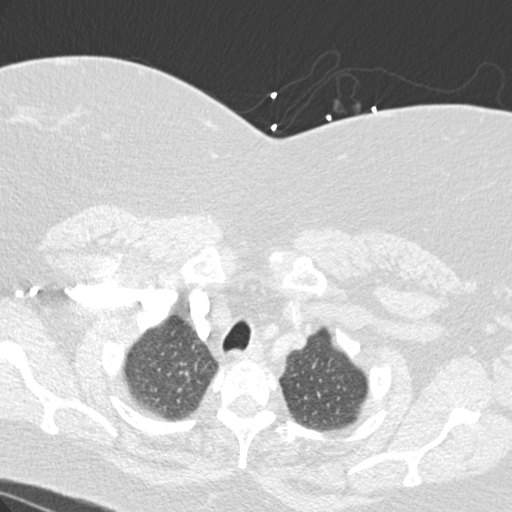
[im 211/223  mediastinal]
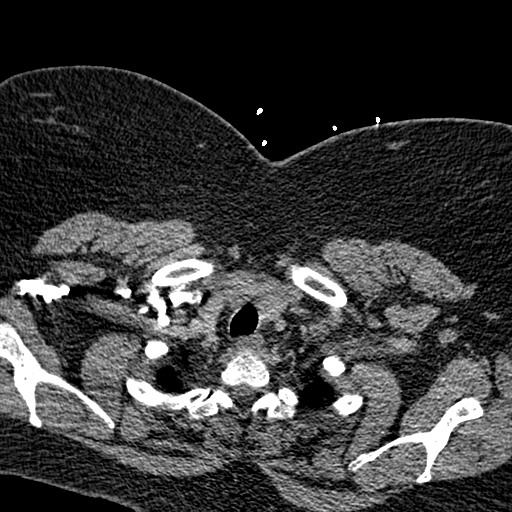

[Series 7: coronal mpr · coronal · 0.43mm/px · 1 of 134 slices shown]
[im 67/134  mediastinal]
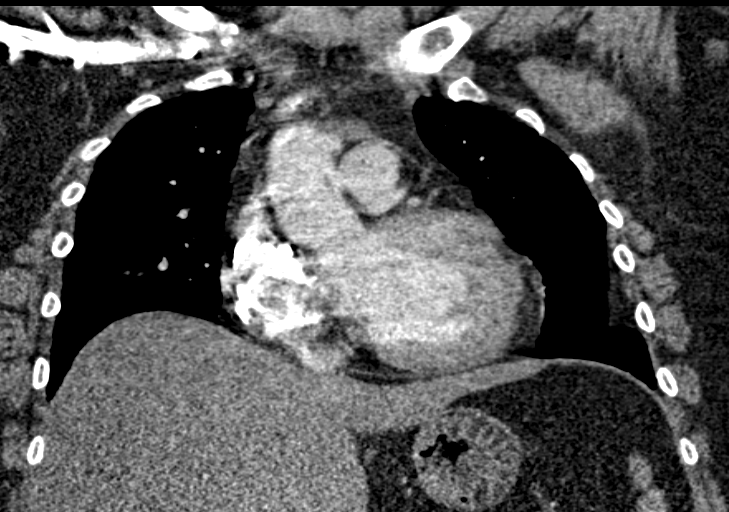

[19 of 36 positions shown; findings below may reference images not displayed]

FINDINGS: Examination is technically indeterminate for evaluation of pulmonary
embolus due to poor contrast bolus. There is limited opacification
of the pulmonary arteries. Presence or absence of pulmonary embolus
cannot be confirmed.

Normal heart size. Normal caliber thoracic aorta. Esophagus is
decompressed. No significant lymphadenopathy in the chest.

Evaluation of lungs is somewhat limited due to motion artifact.
There appears to be atelectasis in the lung bases. No focal
consolidation or airspace disease. No pleural effusions. No
pneumothorax. Airways are patent.

Included portions of the upper abdominal organs are grossly
unremarkable. No destructive bone lesions.

Review of the MIP images confirms the above findings.
IMPRESSION: Examination is technically indeterminate for evaluation of pulmonary
arteries. No evidence of active pulmonary disease.

## 2018-04-12 ENCOUNTER — Encounter (HOSPITAL_COMMUNITY): Payer: Self-pay

## 2018-04-12 ENCOUNTER — Emergency Department (HOSPITAL_COMMUNITY): Payer: Self-pay

## 2018-04-12 ENCOUNTER — Other Ambulatory Visit: Payer: Self-pay

## 2018-04-12 ENCOUNTER — Emergency Department (HOSPITAL_COMMUNITY)
Admission: EM | Admit: 2018-04-12 | Discharge: 2018-04-12 | Disposition: A | Discharge status: Home / Self Care | Payer: Self-pay | Attending: Emergency Medicine | Admitting: Emergency Medicine

## 2018-04-12 DIAGNOSIS — Y939 Activity, unspecified: Secondary | ICD-10-CM | POA: Insufficient documentation

## 2018-04-12 DIAGNOSIS — S52124A Nondisplaced fracture of head of right radius, initial encounter for closed fracture: Secondary | ICD-10-CM | POA: Insufficient documentation

## 2018-04-12 DIAGNOSIS — W010XXA Fall on same level from slipping, tripping and stumbling without subsequent striking against object, initial encounter: Secondary | ICD-10-CM | POA: Insufficient documentation

## 2018-04-12 DIAGNOSIS — Y999 Unspecified external cause status: Secondary | ICD-10-CM | POA: Insufficient documentation

## 2018-04-12 DIAGNOSIS — W19XXXA Unspecified fall, initial encounter: Secondary | ICD-10-CM

## 2018-04-12 DIAGNOSIS — Y929 Unspecified place or not applicable: Secondary | ICD-10-CM | POA: Insufficient documentation

## 2018-04-12 MED ORDER — HYDROCODONE-ACETAMINOPHEN 5-325 MG PO TABS: 1.0000 | ORAL_TABLET | Freq: Four times a day (QID) | ORAL | 0 refills | Status: DC | PRN

## 2018-04-12 MED ORDER — HYDROCODONE-ACETAMINOPHEN 5-325 MG PO TABS
2.0000 | ORAL_TABLET | Freq: Once | ORAL | Status: AC
  Administered 2018-04-12: 2 via ORAL
  Filled 2018-04-12: qty 2

## 2018-04-12 NOTE — ED Notes (Signed)
Pt reports fall while ambulating in the hallway at home.  States her legs often "give out" on her.  She landed on her back and is unsure how she hurt her R arm.  Bruising noted on her L medial upper arm.  No obvious deformity noted, swelling observed.

## 2018-04-12 NOTE — Discharge Instructions (Addendum)
You have a radial head fracture, all your other x-rays look good.  These typically heal very well on their own, you will need to wear a sling for the first 2 to 3 days, after that it is important to remove the sling and do range of motion exercises with your elbow to help ensure that the area heals well with good range of motion in your arm.  Use the sheet of exercises provided, start slowly by just practicing flexion and extension and as pain improves continue with the other exercises on the sheet.  You may use Tylenol for pain, please only take the recommended dosage, and Norco as prescribed as needed for breakthrough pain.  You can follow-up with Dr. Grandville Silos if you have any complications or return to the emergency department if you have significantly worsened pain, swelling in the elbow, numbness or weakness in the hand or any other new or concerning symptoms.

## 2018-04-12 NOTE — ED Triage Notes (Signed)
Pt states that she has a hx of leg weakness. Pt states that her "legs gave out on her" today, causing her to fall. Pt states she is having right arm pain, from her wrist to her upper arm.

## 2018-04-12 NOTE — ED Provider Notes (Signed)
[facility] DEPT Provider Note   CSN: [id number] Arrival date & time: 04/12/18  1856    History   Chief Complaint Chief Complaint  Patient presents with  . Fall  . Arm Pain    HPI [name] is a 53 y.o. female.     DIAN [name] is a 53 y.o. female with a history of fibromyalgia, chronic back pain, neuropathy and anxiety, who presents to the emergency department for evaluation after a fall.  She reports she was ambulating in the hall and her legs gave out on her, she reports this happens occasionally due to her neuropathy.  She fell back on her back but it seemed to injure her right arm.  She did not hit her head.  She reports pain is most notable over the right elbow which become significantly worse when she tries to straighten the elbow.  She also has some pain in the upper arm, forearm and wrist, no pain or swelling noted in the hand.  She did not hit her head with the fall and denies neck or back pain.  Has been ambulatory since the accident.  Nothing for pain prior to arrival.  Denies numbness tingling or weakness in the right arm or other extremities.     Past Medical History:  Diagnosis Date  . Anxiety   . Chronic back pain   . Fibromyalgia     Patient Active Problem List   Diagnosis Date Noted  . Diverticulitis 07/11/2016  . Cholelithiasis 07/11/2016  . Hepatomegaly 07/11/2016  . Esophageal reflux   . Chronic migraine without aura without status migrainosus, not intractable   . Morbid obesity due to excess calories ([place])   . Symptomatic anemia 06/14/2015  . Menorrhagia 06/14/2015    Past Surgical History:  Procedure Laterality Date  . APPENDECTOMY    . BACK SURGERY    . lumbar  fusions    . PERINEAL WOUND CLOSURE       OB History    Gravida  3   Para  2   Term  2   Preterm  0   AB  1   Living  2     SAB  1   TAB  0   Ectopic  0   Multiple  0   Live Births  2            Home Medications    Prior  to Admission medications   Medication Sig Start Date End Date Taking? Authorizing Provider  acetaminophen (TYLENOL) 500 MG tablet Take 2 tablets (1,000 mg total) by mouth every 6 (six) hours as needed (for migraines). Headache Patient taking differently: Take 1,000 mg by mouth every 6 (six) hours as needed for mild pain or moderate pain (for migraines). Headache 06/15/15   Barton Dubois, MD  ciprofloxacin (CIPRO) 500 MG tablet Take 1 tablet (500 mg total) by mouth every 12 (twelve) hours. 07/11/16   Seabron Spates, CNM  HYDROcodone-acetaminophen (NORCO) 5-325 MG tablet Take 1 tablet by mouth every 6 (six) hours as needed. 04/12/18   Jacqlyn Larsen, PA-C  HYDROmorphone (DILAUDID) 2 MG tablet Take 1 tablet (2 mg total) by mouth every 4 (four) hours as needed for severe pain. 07/11/16   Seabron Spates, CNM  metroNIDAZOLE (FLAGYL) 500 MG tablet Take 1 tablet (500 mg total) by mouth 2 (two) times daily. 07/11/16   Seabron Spates, CNM  promethazine (PHENERGAN) 25 MG tablet Take 1 tablet (25  mg total) by mouth every 6 (six) hours as needed for nausea or vomiting. 07/11/16   Seabron Spates, CNM    Family History Family History  Problem Relation Age of Onset  . Cancer Mother   . Heart disease Mother   . Stroke Mother   . Hypertension Mother   . Heart disease Father     Social History Social History   Tobacco Use  . Smoking status: Never Smoker  . Smokeless tobacco: Never Used  Substance Use Topics  . Alcohol use: No  . Drug use: No     Allergies   Advil [ibuprofen]; Celebrex [celecoxib]; and Oxycontin [oxycodone hcl]   Review of Systems Review of Systems  Constitutional: Negative for chills and fever.  Musculoskeletal: Positive for arthralgias and myalgias. Negative for back pain and neck pain.  Skin: Negative for color change, rash and wound.  Neurological: Negative for headaches.     Physical Exam Updated Vital Signs BP (!) 134/98 (BP Location: Left Arm)   Pulse 81    Temp 98.5 F (36.9 C) (Oral)   Resp 16   Ht 5\' 8"  (1.727 m)   Wt (!) 142.9 kg   SpO2 98%   BMI 47.90 kg/m   Physical Exam Vitals signs and nursing note reviewed.  Constitutional:      General: She is not in acute distress.    Appearance: Normal appearance. She is well-developed and normal weight. She is not ill-appearing or diaphoretic.  HENT:     Head: Normocephalic and atraumatic.     Comments: No evidence of head trauma. Eyes:     General:        Right eye: No discharge.        Left eye: No discharge.  Neck:     Musculoskeletal: Neck supple.     Comments: No midline C-spine tenderness. Pulmonary:     Effort: Pulmonary effort is normal. No respiratory distress.  Musculoskeletal:     Comments: Tenderness throughout the arm most notable surrounding the right elbow, and there is some swelling noted over the radial aspect of the elbow.  No ecchymosis or skin changes, no wounds.  Range of motion of the elbow limited due to pain.  No tenderness or deformity at the shoulder patient has some mild tenderness through the humerus without palpable deformity, no ecchymosis.  Minimal tenderness to the forearm and no wrist deformity noted.  2+ radial pulse and good capillary refill throughout, sensation intact throughout the arm.  Cardinal hand movements intact. Although the joint supple and easily movable, all compartments soft. No lumbar or thoracic midline tenderness.  Skin:    General: Skin is warm and dry.     Capillary Refill: Capillary refill takes less than 2 seconds.  Neurological:     Mental Status: She is alert and oriented to person, place, and time.     Coordination: Coordination normal.  Psychiatric:        Mood and Affect: Mood normal.        Behavior: Behavior normal.      ED Treatments / Results  Labs (all labs ordered are listed, but only abnormal results are displayed) Labs Reviewed - No data to display  EKG None  Radiology Dg Elbow Complete Right  Result  Date: 04/12/2018 CLINICAL DATA:  Pain following fall EXAM: RIGHT ELBOW - COMPLETE 3+ VIEW COMPARISON:  None. FINDINGS: Frontal, lateral, and bilateral oblique views were obtained. There is an incomplete fracture along the lateral aspect of the  radial head with alignment essentially anatomic. No other evident fracture. No dislocation. There is a sizable joint effusion. No appreciable joint space narrowing or erosion. IMPRESSION: Nondisplaced fracture radial head with sizable joint effusion. No other fracture. No dislocation. No evident arthropathy. Electronically Signed   By: Lowella Grip III M.D.   On: 04/12/2018 21:16   Dg Forearm Right  Result Date: 04/12/2018 CLINICAL DATA:  53 year old female with leg weakness and fall EXAM: RIGHT FOREARM - 2 VIEW COMPARISON:  None. FINDINGS: Elbow joint effusion. Questionable radial head fracture. No distal radial or ulnar fracture identified. IMPRESSION: Elbow joint effusion on the lateral view, compatible with elbow fracture. There is questionable radial head fracture. A dedicated plain film series of the elbow is recommended. Electronically Signed   By: Corrie Mckusick D.O.   On: 04/12/2018 19:56   Dg Wrist Complete Right  Result Date: 04/12/2018 CLINICAL DATA:  53 year old female with a history of leg weakness EXAM: RIGHT WRIST - COMPLETE 3+ VIEW COMPARISON:  None. FINDINGS: There is no evidence of fracture or dislocation. There is no evidence of arthropathy or other focal bone abnormality. Soft tissues are unremarkable. IMPRESSION: Negative for acute bony abnormality Electronically Signed   By: Corrie Mckusick D.O.   On: 04/12/2018 19:54   Dg Humerus Right  Result Date: 04/12/2018 CLINICAL DATA:  53 year old female with a history of fall EXAM: RIGHT HUMERUS - 2+ VIEW COMPARISON:  None. FINDINGS: No acute bony abnormality of the right humerus. No radiopaque foreign body. Limited evaluation of the scapula. IMPRESSION: Limited plain film demonstrates no evidence  of acute humerus fracture Electronically Signed   By: Corrie Mckusick D.O.   On: 04/12/2018 19:56    Procedures Procedures (including critical care time)  Medications Ordered in ED Medications  HYDROcodone-acetaminophen (NORCO/VICODIN) 5-325 MG per tablet 2 tablet (2 tablets Oral Given 04/12/18 2042)     Initial Impression / Assessment and Plan / ED Course  I have reviewed the triage vital signs and the nursing notes.  Pertinent labs & imaging results that were available during my care of the patient were reviewed by me and considered in my medical decision making (see chart for details).  Patient had a fall today where she landed injuring her right arm.  She reports that she frequently has issues where her legs get out on her history of chronic fibromyalgia and neuropathy.  On exam patient has pain and swelling around the right elbow also endorses some pain in the right upper arm and right wrist.  There is no obvious deformity.  The right arm is neurovascularly intact and cardinal hand movements are intact.  Patient reports she is sore from the fall but has no other focal area of pain she has no midline spinal tenderness, no chest or abdominal tenderness and did not hit her head.  X-rays of the humerus, forearm and wrist ordered from triage which show a right elbow effusion without clear visualization of the elbow, no other acute fractures noted.  Patient was given Norco for pain and dedicated elbow films were obtained which show a nondisplaced radial head fracture, no other injuries from the fall noted.  Patient will be placed in sling for 48 hours for comfort I have also provided her with range of motion exercises.  Patient is unable to take ibuprofen so will prescribe a short course of Norco, and I have discussed appropriate Tylenol dosing as well.  Encouraged ice and elevation.  Follow-up with orthopedics.  Return precautions discussed.  Patient expresses understanding and agreement with plan.   Discharged home in good condition.  Final Clinical Impressions(s) / ED Diagnoses   Final diagnoses:  Fall, initial encounter  Closed nondisplaced fracture of head of right radius, initial encounter    ED Discharge Orders         Ordered    HYDROcodone-acetaminophen (NORCO) 5-325 MG tablet  Every 6 hours PRN     04/12/18 2209           Janet Berlin 04/12/18 2358    Jola Schmidt, MD 04/13/18 [phone]

## 2018-07-25 ENCOUNTER — Encounter (HOSPITAL_COMMUNITY): Payer: Self-pay

## 2018-07-25 ENCOUNTER — Emergency Department (HOSPITAL_COMMUNITY): Payer: Self-pay

## 2018-07-25 ENCOUNTER — Emergency Department (HOSPITAL_COMMUNITY)
Admission: EM | Admit: 2018-07-25 | Discharge: 2018-07-25 | Disposition: A | Discharge status: Home / Self Care | Payer: Self-pay | Attending: Emergency Medicine | Admitting: Emergency Medicine

## 2018-07-25 ENCOUNTER — Other Ambulatory Visit: Payer: Self-pay

## 2018-07-25 DIAGNOSIS — R079 Chest pain, unspecified: Secondary | ICD-10-CM | POA: Diagnosis not present

## 2018-07-25 DIAGNOSIS — R410 Disorientation, unspecified: Secondary | ICD-10-CM | POA: Insufficient documentation

## 2018-07-25 DIAGNOSIS — M545 Low back pain: Secondary | ICD-10-CM | POA: Insufficient documentation

## 2018-07-25 DIAGNOSIS — M546 Pain in thoracic spine: Secondary | ICD-10-CM | POA: Diagnosis not present

## 2018-07-25 DIAGNOSIS — Y939 Activity, unspecified: Secondary | ICD-10-CM | POA: Insufficient documentation

## 2018-07-25 DIAGNOSIS — R0602 Shortness of breath: Secondary | ICD-10-CM | POA: Insufficient documentation

## 2018-07-25 DIAGNOSIS — Y929 Unspecified place or not applicable: Secondary | ICD-10-CM | POA: Diagnosis not present

## 2018-07-25 DIAGNOSIS — Y999 Unspecified external cause status: Secondary | ICD-10-CM | POA: Insufficient documentation

## 2018-07-25 DIAGNOSIS — R109 Unspecified abdominal pain: Secondary | ICD-10-CM | POA: Diagnosis not present

## 2018-07-25 DIAGNOSIS — M542 Cervicalgia: Secondary | ICD-10-CM | POA: Diagnosis present

## 2018-07-25 LAB — BASIC METABOLIC PANEL
Anion gap: 9 (ref 5–15)
BUN: 12 mg/dL (ref 6–20)
CO2: 25 mmol/L (ref 22–32)
Calcium: 8.7 mg/dL — ABNORMAL LOW (ref 8.9–10.3)
Chloride: 106 mmol/L (ref 98–111)
Creatinine, Ser: 0.68 mg/dL (ref 0.44–1.00)
GFR calc Af Amer: >60 mL/min (ref >60)
GFR calc non Af Amer: >60 mL/min (ref >60)
Glucose, Bld: 116 mg/dL — ABNORMAL HIGH (ref 70–99)
Potassium: 3.7 mmol/L (ref 3.5–5.1)
Sodium: 140 mmol/L (ref 135–145)

## 2018-07-25 LAB — CBC
HCT: 42.4 % (ref 36.0–46.0)
Hemoglobin: 13.9 g/dL (ref 12.0–15.0)
MCH: 29 pg (ref 26.0–34.0)
MCHC: 32.8 g/dL (ref 30.0–36.0)
MCV: 88.3 fL (ref 80.0–100.0)
Platelets: 378 10*3/uL (ref 150–400)
RBC: 4.8 MIL/uL (ref 3.87–5.11)
RDW: 13.6 % (ref 11.5–15.5)
WBC: 10.9 10*3/uL — ABNORMAL HIGH (ref 4.0–10.5)
nRBC: 0 % (ref 0.0–0.2)

## 2018-07-25 LAB — I-STAT BETA HCG BLOOD, ED (MC, WL, AP ONLY): I-stat hCG, quantitative: <5 m[IU]/mL (ref <5)

## 2018-07-25 LAB — TROPONIN I: Troponin I: <0.03 ng/mL (ref <0.03)

## 2018-07-25 MED ORDER — SODIUM CHLORIDE 0.9% FLUSH
3.0000 mL | Freq: Once | INTRAVENOUS | Status: AC
  Administered 2018-07-25: 3 mL via INTRAVENOUS

## 2018-07-25 MED ORDER — CYCLOBENZAPRINE HCL 10 MG PO TABS: 10.0000 mg | ORAL_TABLET | Freq: Two times a day (BID) | ORAL | 0 refills | Status: DC | PRN

## 2018-07-25 MED ORDER — DIAZEPAM 5 MG PO TABS
5.0000 mg | ORAL_TABLET | Freq: Once | ORAL | Status: AC
  Administered 2018-07-25: 5 mg via ORAL
  Filled 2018-07-25: qty 1

## 2018-07-25 MED ORDER — FENTANYL CITRATE (PF) 100 MCG/2ML IJ SOLN
50.0000 ug | Freq: Once | INTRAMUSCULAR | Status: AC
  Administered 2018-07-25: 50 ug via INTRAVENOUS
  Filled 2018-07-25: qty 2

## 2018-07-25 MED ORDER — SODIUM CHLORIDE (PF) 0.9 % IJ SOLN
INTRAMUSCULAR | Status: AC
  Filled 2018-07-25: qty 50

## 2018-07-25 MED ORDER — IOHEXOL 300 MG/ML  SOLN
100.0000 mL | Freq: Once | INTRAMUSCULAR | Status: AC | PRN
  Administered 2018-07-25: 100 mL via INTRAVENOUS
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
## 2018-07-25 NOTE — Discharge Instructions (Addendum)
Take Tylenol as needed for the next week. Take this medicine with food. Take muscle relaxer as directed. This medicine makes you drowsy so do not take before driving or work Use a heating pad for sore muscles - use for 20 minutes several times a day Return for worsening symptoms
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
## 2018-07-25 NOTE — ED Notes (Signed)
2 unsuccessful IV attempts by this RN.  Another staff RN to attempt.

## 2019-02-25 DIAGNOSIS — G43909 Migraine, unspecified, not intractable, without status migrainosus: Secondary | ICD-10-CM

## 2019-02-25 HISTORY — DX: Migraine, unspecified, not intractable, without status migrainosus: G43.909

## 2019-06-13 ENCOUNTER — Other Ambulatory Visit: Payer: Self-pay

## 2019-06-13 ENCOUNTER — Ambulatory Visit: Payer: Self-pay

## 2019-06-13 ENCOUNTER — Ambulatory Visit: Payer: Self-pay | Admitting: Internal Medicine

## 2019-06-13 VITALS — BP 154/77 | HR 75 | Temp 98.3°F | Ht 68.0 in | Wt 322.2 lb

## 2019-06-13 DIAGNOSIS — F329 Major depressive disorder, single episode, unspecified: Secondary | ICD-10-CM

## 2019-06-13 DIAGNOSIS — F418 Other specified anxiety disorders: Secondary | ICD-10-CM

## 2019-06-13 DIAGNOSIS — F419 Anxiety disorder, unspecified: Secondary | ICD-10-CM

## 2019-06-13 DIAGNOSIS — G894 Chronic pain syndrome: Secondary | ICD-10-CM

## 2019-06-13 DIAGNOSIS — Z981 Arthrodesis status: Secondary | ICD-10-CM

## 2019-06-13 DIAGNOSIS — I10 Essential (primary) hypertension: Secondary | ICD-10-CM | POA: Insufficient documentation

## 2019-06-13 DIAGNOSIS — Z79899 Other long term (current) drug therapy: Secondary | ICD-10-CM

## 2019-06-13 MED ORDER — BUPROPION HCL ER (SR) 100 MG PO TB12: 100.0000 mg | ORAL_TABLET | Freq: Two times a day (BID) | ORAL | 0 refills | Status: DC

## 2019-06-13 MED FILL — buPROPion HCL ER (SR) 100 M: 100 | 30 days supply | Qty: 60 | Fill #0

## 2019-06-13 NOTE — Progress Notes (Signed)
   CC: establish care  HPI:  [name] is a 54 y.o. F with significant PMH of chronic back pain, fibromyalgia, anxiety, and morbid obesity, who presents to establish care. Please see problem-based charting for additional information.   Past Medical History:  Diagnosis Date  . Anxiety   . Chronic back pain   . Fibromyalgia    Social History   Tobacco Use  . Smoking status: Never Smoker  . Smokeless tobacco: Never Used  Substance Use Topics  . Alcohol use: No  . Drug use: No   Pt lives at home with her son, who see is reliant on for all ADLs, (bathing, changing, cooking, transferring, and toileting). She states her son is unable to keep a job due to needing to take care of her 24/7.   Family History  Problem Relation Age of Onset  . Cancer Mother   . Heart disease Mother   . Stroke Mother   . Hypertension Mother   . Heart disease Father    Review of Systems:  . Review of Systems  Constitutional: Positive for malaise/fatigue. Negative for chills and fever.  HENT: Negative for congestion and sore throat.   Eyes: Negative for blurred vision, double vision and photophobia.  Respiratory: Negative for cough and shortness of breath.   Cardiovascular: Negative for chest pain, palpitations and leg swelling.  Gastrointestinal: Negative for abdominal pain, nausea and vomiting.  Musculoskeletal: Positive for back pain and myalgias. Negative for falls.  Skin: Negative.   Neurological: Positive for weakness. Negative for dizziness and headaches.  Psychiatric/Behavioral: Positive for depression. The patient is nervous/anxious.    Physical Exam:  Vitals:   06/13/19 1425 06/13/19 1426  BP:  (!) 154/77  Pulse:  75  Temp:  98.3 F (36.8 C)  TempSrc:  Oral  SpO2:  98%  Weight: (!) 322 lb 3.2 oz (146.1 kg)   Height: 5\' 8"  (1.727 m)    Physical Exam Vitals and nursing note reviewed.  Constitutional:      General: She is not in acute distress.    Appearance: She is obese.  She is not ill-appearing.  Cardiovascular:     Rate and Rhythm: Normal rate and regular rhythm.     Heart sounds: Normal heart sounds.  Pulmonary:     Effort: Pulmonary effort is normal. No respiratory distress.     Breath sounds: Normal breath sounds. No wheezing, rhonchi or rales.  Abdominal:     General: Abdomen is flat. Bowel sounds are normal.     Palpations: Abdomen is soft.  Musculoskeletal:     Comments: Tenderness to palpation of lumber spine and paraspinal muscles. ROM in flexion and rotation limited secondary to pain.  Skin:    General: Skin is warm and dry.  Neurological:     General: No focal deficit present.     Mental Status: She is alert and oriented to person, place, and time.     Comments: Pt fatigues quickly in testing but strength is full and symmetric in upper and lower extremities.  Psychiatric:        Mood and Affect: Mood is anxious. Affect is tearful.    Assessment & Plan:   See Encounters Tab for problem based charting.  Patient discussed with Dr. Dareen Piano

## 2019-06-13 NOTE — Patient Instructions (Addendum)
Donna [name],  It was nice meeting you! Thank you for establishing with our clinic.   Today we discussed your anxiety and pain. For your anxiety, I have sent in a prescription to the [facility] for Wellbutrin 100mg  twice a day. You can also go to [facility] at [address], [place], [place] [address] or call [phone], as this is a walk in psychiatry clinic.  Continue to work with financial counseling for insurance options.  Follow-up in clinic in 1 month for continued management.

## 2019-06-15 ENCOUNTER — Encounter: Payer: Self-pay | Admitting: Licensed Clinical Social Worker

## 2019-06-15 ENCOUNTER — Telehealth: Payer: Self-pay | Admitting: Licensed Clinical Social Worker

## 2019-06-15 NOTE — Telephone Encounter (Signed)
Patient was called to discuss the referral for services. Patient did not answer, and a vm was left for the patient. A letter will also be mailed today.

## 2019-06-21 ENCOUNTER — Ambulatory Visit: Payer: Self-pay

## 2019-06-21 NOTE — Chronic Care Management (AMB) (Signed)
  Chronic Care Management   Outreach Note  06/21/2019 Name: Donna [name] MRN: [id number] DOB: March 04, 1965  Referred by: Mitzi Hansen, MD Reason for referral : Care Coordination (Placement)   An unsuccessful telephone outreach was attempted today. The patient was referred to the case management team for assistance with care management and care coordination.   Follow Up Plan: A HIPPA compliant phone message was left for the patient providing contact information and requesting a return call.      Ronn Melena, [place] Coordination Social Worker [place] [phone]

## 2019-06-22 ENCOUNTER — Telehealth: Payer: Self-pay | Admitting: Licensed Clinical Social Worker

## 2019-06-22 NOTE — Telephone Encounter (Signed)
Patient was called, 2nd attempt. Patient did not answer, and a vm was left for the patient to call our office to set up an appointment.

## 2019-06-27 ENCOUNTER — Ambulatory Visit: Payer: Self-pay | Admitting: *Deleted

## 2019-06-27 ENCOUNTER — Ambulatory Visit: Payer: Self-pay

## 2019-06-27 DIAGNOSIS — I10 Essential (primary) hypertension: Secondary | ICD-10-CM

## 2019-06-27 DIAGNOSIS — G43709 Chronic migraine without aura, not intractable, without status migrainosus: Secondary | ICD-10-CM

## 2019-06-27 DIAGNOSIS — G894 Chronic pain syndrome: Secondary | ICD-10-CM

## 2019-06-27 NOTE — Chronic Care Management (AMB) (Signed)
Care Management   Follow Up Note   06/27/2019 Name: Donna [name] MRN: [id number] DOB: Jul 26, 1965  Referred by: Mitzi Hansen, MD Reason for referral : Care Coordination (SDOH)   Donna [name] is a 54 y.o. year old female who is a primary care patient of Christian, Lucinda Dell, MD. The care management team was consulted for assistance with care management and care coordination needs.    Review of patient status, including review of consultants reports, relevant laboratory and other test results, and collaboration with appropriate care team members and the patient's provider was performed as part of comprehensive patient evaluation and provision of chronic care management services.    SDOH (Social Determinants of Health) assessments performed: Yes See Care Plan activities for detailed interventions related to SDOH)  SDOH Interventions     Most Recent Value  SDOH Interventions  SDOH Interventions for the Following Domains  Financial Strain, Food Insecurity  Food Insecurity Interventions  Other (Comment) [patient started receiving food stamps 2 months ago.  Utilizes area pantries when needed.]  Financial Strain Interventions  Other (Comment) [Referral to Care Guide for assistance with Medicaid application.  Referal to [place]  Depression Interventions/Treatment   -- [informed her that clinic counselor has attempted to contact her]       Advanced Directives: See Care Plan and Vynca application for related entries.   Goals Addressed            This Visit's Progress   . "I can't manage my health conditions because I don't have insurance to cover medical costs" (pt-stated)       Current Barriers:  Marland Kitchen Knowledge Barriers related to resources available for financial constraints.    Case Manager Clinical Goal(s):  Marland Kitchen Over the next 90 days, patient will work with BSW to address needs related to financial constraints/applying for Medicaid . Over the next 90 days, BSW will  collaborate with RN Care Manager to address care management and care coordination needs  Interventions:  . Patient interviewed and appropriate assessments performed . Collaborated with RN Care Manager and patient to establish an individualized plan of care  . Collaborated with care guide team regarding assistance with Medicaid application   Patient Self Care Activities:  . Patient verbalizes understanding of plan to work with Care Guide/BSW to apply for Medicaid  Initial goal documentation     . "I would like to apply for disability" (pt-stated)       Current Barriers:  Marland Kitchen Knowledge Barriers related to resources available  for financial constraints.    Case Manager Clinical Goal(s):  Marland Kitchen Over the next 90 days, patient will work with BSW to address needs related to Ffnancial constraints; applying for disability.   . Over the next 90 days, BSW will collaborate with RN Care Manager to address care management and care coordination needs  Interventions:  . Patient interviewed and appropriate assessments performed . Informed patient about Disability Assistance Program through [facility].   . Mailed consent form that is needed for referral to program.  Patient will sign and send back . Collaborated with RN Care Manager and patient to establish an individualized plan of care   Patient Self Care Activities:  . Patient verbalizes understanding of plan to work with BSW for assistance with disability application.    Initial goal documentation         Telephone follow up appointment with care management team member scheduled for:07/04/19    Makaveli Hoard, [place] Coordination  Social Worker [place] [phone]

## 2019-06-27 NOTE — Chronic Care Management (AMB) (Signed)
  Care Management   Initial Visit Note  06/27/2019 Name: Donna [name] MRN: [id number] DOB: 07/15/1965  Subjective:   Objective:  Assessment: Donna [name] is a 54 y.o. year old female who sees Mitzi Hansen, MD for primary care. The care management team was consulted for assistance with care management and care coordination needs related to applying for Medicaid and social security disability.  Review of patient status, including review of consultants reports, relevant laboratory and other test results, and collaboration with appropriate care team members and the patient's provider was performed as part of comprehensive patient evaluation and provision of care management services.    SDOH (Social Determinants of Health) assessments performed: No See Care Plan activities for detailed interventions related to [facility])     Outpatient Encounter Medications as of 06/27/2019  Medication Sig  . acetaminophen (TYLENOL) 500 MG tablet Take 2 tablets (1,000 mg total) by mouth every 6 (six) hours as needed (for migraines). Headache (Patient taking differently: Take 1,000-2,000 mg by mouth every 6 (six) hours as needed for moderate pain or headache. )  . buPROPion (WELLBUTRIN SR) 100 MG 12 hr tablet Take 1 tablet (100 mg total) by mouth 2 (two) times daily.  . cyclobenzaprine (FLEXERIL) 10 MG tablet Take 1 tablet (10 mg total) by mouth 2 (two) times daily as needed for muscle spasms.   No facility-administered encounter medications on file as of 06/27/2019.    Goals Addressed            This Visit's Progress     Patient Stated   . "I told the social worker it's hard to manage my health conditions when I don't have health insurance." (pt-stated)       CARE PLAN ENTRY (see longitudinal plan of care for additional care plan information)   Current Barriers:  . Chronic Disease Management support, education, and care coordination needs related to HTN and migraines and chronic pain syndrome  Case  Manager Clinical Goal(s):  Marland Kitchen Over the next 90 days, patient will work with BSW to address needs related to applying for medicaid and social security disability in patient with  HTN, Migraines and chronic pain syndrome  Interventions:  . Collaborated with BSW to initiate plan of care to address needs related to applying for medicaid and social security disability patient with HTN, Migraines and chronic pain syndrome  Patient Self Care Activities:  . Patient verbalizes understanding of plan to work with BSW and care guide to assist with applying for Medicaid and social security disability . Self administers medications as prescribed . Attends all scheduled provider appointments . Calls pharmacy for medication refills . Performs ADL's independently . Performs IADL's independently . Calls provider office for new concerns or questions  Initial goal documentation         Follow up plan:  The care management team will reach out to the patient again over the next 90 days.   Ms. Ruan was given information about Care Management services today including:  1. Care Management services include personalized support from designated clinical staff supervised by a physician, including individualized plan of care and coordination with other care providers 2. 24/7 contact phone numbers for assistance for urgent and routine care needs. 3. The patient may stop Care Management services at any time (effective at the end of the month) by phone call to the office staff.  Patient agreed to services and verbal consent obtained.  Kelli Churn RN, CCM, [facility] RN Care Manager [phone]

## 2019-06-27 NOTE — Patient Instructions (Signed)
Visit Information  Goals Addressed            This Visit's Progress   . "I can't manage my health conditions because I don't have insurance to cover medical costs" (pt-stated)       Current Barriers:  Marland Kitchen Knowledge Barriers related to resources available for financial constraints.    Case Manager Clinical Goal(s):  Marland Kitchen Over the next 90 days, patient will work with BSW to address needs related to financial constraints/applying for Medicaid . Over the next 90 days, BSW will collaborate with RN Care Manager to address care management and care coordination needs  Interventions:  . Patient interviewed and appropriate assessments performed . Collaborated with RN Care Manager and patient to establish an individualized plan of care  . Collaborated with care guide team regarding assistance with Medicaid application   Patient Self Care Activities:  . Patient verbalizes understanding of plan to work with Care Guide/BSW to apply for Medicaid  Initial goal documentation     . "I would like to apply for disability" (pt-stated)       Current Barriers:  Marland Kitchen Knowledge Barriers related to resources available  for financial constraints.    Case Manager Clinical Goal(s):  Marland Kitchen Over the next 90 days, patient will work with BSW to address needs related to Ffnancial constraints; applying for disability.   . Over the next 90 days, BSW will collaborate with RN Care Manager to address care management and care coordination needs  Interventions:  . Patient interviewed and appropriate assessments performed . Informed patient about Disability Assistance Program through [facility].   . Mailed consent form that is needed for referral to program.  Patient will sign and send back . Collaborated with RN Care Manager and patient to establish an individualized plan of care   Patient Self Care Activities:  . Patient verbalizes understanding of plan to work with BSW for assistance with disability application.     Initial goal documentation        Patient verbalizes understanding of instructions provided today.   Telephone follow up appointment with care management team member scheduled for:07/04/19    Ronn Melena, [place] Coordination Social Worker [place] [phone]

## 2019-06-27 NOTE — Patient Instructions (Signed)
Visit Information  Goals Addressed            This Visit's Progress     Patient Stated   . "I told the social worker it's hard to manage my health conditions when I don't have health insurance." (pt-stated)       CARE PLAN ENTRY (see longitudinal plan of care for additional care plan information)   Current Barriers:  . Chronic Disease Management support, education, and care coordination needs related to HTN and migraines and chronic pain syndrome  Case Manager Clinical Goal(s):  Marland Kitchen Over the next 90 days, patient will work with BSW to address needs related to applying for medicaid and social security disability in patient with  HTN, Migraines and chronic pain syndrome  Interventions:  . Collaborated with BSW to initiate plan of care to address needs related to applying for medicaid and social security disability patient with HTN, Migraines and chronic pain syndrome  Patient Self Care Activities:  . Patient verbalizes understanding of plan to work with BSW and care guide to assist with applying for Medicaid and social security disability . Self administers medications as prescribed . Attends all scheduled provider appointments . Calls pharmacy for medication refills . Performs ADL's independently . Performs IADL's independently . Calls provider office for new concerns or questions  Initial goal documentation        Ms. Gameros was given information about Care Management services today including:  1. Care Management services include personalized support from designated clinical staff supervised by her physician, including individualized plan of care and coordination with other care providers 2. 24/7 contact phone numbers for assistance for urgent and routine care needs. 3. The patient may stop CCM services at any time (effective at the end of the month) by phone call to the office staff.  Patient agreed to services and verbal consent obtained.   The patient verbalized  understanding of instructions provided today and declined a print copy of patient instruction materials.   The care management team will reach out to the patient again over the next 90 days.   Kelli Churn RN, CCM, [facility] RN Care Manager [phone]

## 2019-06-28 NOTE — Progress Notes (Signed)
Internal Medicine Clinic Resident   I have personally reviewed this encounter including the documentation in this note and/or discussed this patient with the care management provider. I will address any urgent items identified by the care management provider and will communicate my actions to the patient's PCP. I have reviewed the patient's CCM visit with my supervising attending.  Makaelyn Aponte, MD 06/28/2019    

## 2019-06-28 NOTE — Progress Notes (Signed)
Internal Medicine Clinic Attending  CCM services provided by the care management provider and their documentation were discussed with Dr. Chundi. We reviewed the pertinent findings, urgent action items addressed by the resident and non-urgent items to be addressed by the PCP.  I agree with the assessment, diagnosis, and plan of care documented in the CCM and resident's note.  Kellina Dreese, MD 06/28/2019  

## 2019-06-28 NOTE — Progress Notes (Signed)
Internal Medicine Clinic Resident   I have personally reviewed this encounter including the documentation in this note and/or discussed this patient with the care management provider. I will address any urgent items identified by the care management provider and will communicate my actions to the patient's PCP. I have reviewed the patient's CCM visit with my supervising attending.  Jden Want, MD 06/28/2019    

## 2019-06-28 NOTE — Progress Notes (Signed)
Internal Medicine Clinic Attending  CCM services provided by the care management provider and their documentation were discussed with Dr. Chundi. We reviewed the pertinent findings, urgent action items addressed by the resident and non-urgent items to be addressed by the PCP.  I agree with the assessment, diagnosis, and plan of care documented in the CCM and resident's note.  Brionne Mertz, MD 06/28/2019  

## 2019-06-29 ENCOUNTER — Telehealth: Payer: Self-pay | Admitting: Licensed Clinical Social Worker

## 2019-06-29 NOTE — Telephone Encounter (Signed)
Patient called (2nd attempt) to discuss referral. Patient did not answer, and a vm was left for the patient to call our office to schedule in the future.

## 2019-07-01 NOTE — Assessment & Plan Note (Signed)
Pt endorsing anxiety and depression surrounding her current functional status and chronic pain. Endorses anxiety whenever she has to leave the house. Son says the pt asked him to pull over on the way to clinic today due to her anxiety/panic attack. She has not see a provider in many years after losing health insurance when she became unable to work. Was previously seen by a psychiatrist and on Wellbutrin and Klonopin with decent symptoms control. She denies HI or SI. PHQ-9 today is 25.   - begin Wellbutrin 100mg  BID - referral to behavioral health services - given information for [facility] walk-in clinic for pt to be seen by psychiatry - can get GAD-7 at next visit  Depression screen [facility] 2/9 06/13/2019 07/11/2015  Decreased Interest 3 3  Down, Depressed, Hopeless 3 3  PHQ - 2 Score 6 6  Altered sleeping 3 3  Tired, decreased energy 3 3  Change in appetite 1 2  Feeling bad or failure about yourself  3 3  Trouble concentrating 3 3  Moving slowly or fidgety/restless 3 1  Suicidal thoughts 3 0  PHQ-9 Score 25 21  Difficult doing work/chores Extremely dIfficult -

## 2019-07-01 NOTE — Assessment & Plan Note (Addendum)
Pt with history of chronic pain syndrome and chronic back pain. Has L4-S1 fusion. At one time neurosurgery was planning for further procedures though pt cannot recall if it was for a baclofen pump vs additional fusions. Was previously followed by a pain clinic. Has not been on opioids in 3-5 years. Currently controlling pain with tylenol at home. Pt describes her pain as stable and denies any acute worsening. Pt has known urinary incontinence requiring her to be frequently changed by her son. No red flag symptoms, fevers, saddle anesthesia, bowel incontinence, weight loss, or nocturnal pain. Examination remarkable for tenderness over lumbar spinal and paraspinal muscles. No underlying spasm or overlying skin changes. ROM in back flexion and rotation limited secondary to pain.   - continue tylenol in addition to ibuprofen at home - f/u with financial counselor for [place] referral for medicaid and disability assistance - refer to pain management and neurosurgery when pt obtains insurance

## 2019-07-01 NOTE — Assessment & Plan Note (Addendum)
Pt denies history of hypertension and is not currently taking any anti-hypertensive medications. BP today elevated to 154/77. Last BP readings in chart over 1 year ago also elevated. Renal function normal in 06/2018. Pt likely needs medication initiation for BP control given age and obesity, but will defer at this time until recheck at PCP follow-up in 1 month. Will not recheck BMP at this visit given pt is self pay.  BP Readings from Last 3 Encounters:  06/13/19 (!) 154/77  07/25/18 (!) 156/87  04/12/18 (!) 134/98

## 2019-07-04 ENCOUNTER — Ambulatory Visit: Payer: Self-pay

## 2019-07-04 DIAGNOSIS — G894 Chronic pain syndrome: Secondary | ICD-10-CM

## 2019-07-04 DIAGNOSIS — G43709 Chronic migraine without aura, not intractable, without status migrainosus: Secondary | ICD-10-CM

## 2019-07-04 DIAGNOSIS — I10 Essential (primary) hypertension: Secondary | ICD-10-CM

## 2019-07-04 NOTE — Patient Instructions (Signed)
Visit Information  Goals Addressed            This Visit's Progress   . "I can't manage my health conditions because I don't have insurance to cover medical costs" (pt-stated)       Current Barriers:  Marland Kitchen Knowledge Barriers related to resources available for financial constraints.    Case Manager Clinical Goal(s):  Marland Kitchen Over the next 90 days, patient will work with BSW to address needs related to financial constraints/applying for Medicaid . Over the next 90 days, BSW will collaborate with RN Care Manager to address care management and care coordination needs  Interventions:  . Referral placed to Care Guide for assistance with Medicaid application(accidentally failed to do this during last outreach)  Patient Self Care Activities:  . Patient verbalizes understanding of plan to work with Care Guide/BSW to apply for Medicaid  Please see past updates related to this goal by clicking on the "Past Updates" button in the selected goal           A HIPPA compliant phone message was left for the patient providing contact information and requesting a return call.      Ronn Melena, [place] Coordination Social Worker [place] [phone]

## 2019-07-04 NOTE — Progress Notes (Signed)
Internal Medicine Clinic Attending  Case discussed with Dr. Jones at the time of the visit.  We reviewed the resident's history and exam and pertinent patient test results.  I agree with the assessment, diagnosis, and plan of care documented in the resident's note.  

## 2019-07-04 NOTE — Chronic Care Management (AMB) (Signed)
  Care Management   Follow Up Note   07/04/2019 Name: ZHANIYA ODONOGHUE MRN: [id number] DOB: 05-May-1965  Referred by: Mitzi Hansen, MD Reason for referral : Care Coordination (Financial constraints )   EVETTA FULP is a 54 y.o. year old female who is a primary care patient of Christian, Lucinda Dell, MD. The care management team was consulted for assistance with care management and care coordination needs.    Review of patient status, including review of consultants reports, relevant laboratory and other test results, and collaboration with appropriate care team members and the patient's provider was performed as part of comprehensive patient evaluation and provision of chronic care management services.    SDOH (Social Determinants of Health) assessments performed: No See Care Plan activities for detailed interventions related to [facility])     Advanced Directives: See Care Plan and Vynca application for related entries.   Goals Addressed            This Visit's Progress   . "I can't manage my health conditions because I don't have insurance to cover medical costs" (pt-stated)       Current Barriers:  Marland Kitchen Knowledge Barriers related to resources available for financial constraints.    Case Manager Clinical Goal(s):  Marland Kitchen Over the next 90 days, patient will work with BSW to address needs related to financial constraints/applying for Medicaid . Over the next 90 days, BSW will collaborate with RN Care Manager to address care management and care coordination needs  Interventions:  . Referral placed to Care Guide for assistance with Medicaid application(accidentally failed to do this during last outreach)  Patient Self Care Activities:  . Patient verbalizes understanding of plan to work with Care Guide/BSW to apply for Medicaid  Please see past updates related to this goal by clicking on the "Past Updates" button in the selected goal          A HIPPA compliant phone message was left for the patient  providing contact information and requesting a return call.      Ronn Melena, [place] Coordination Social Worker [place] [phone]

## 2019-07-06 ENCOUNTER — Telehealth: Payer: Self-pay

## 2019-07-06 ENCOUNTER — Ambulatory Visit: Payer: Self-pay | Admitting: *Deleted

## 2019-07-06 DIAGNOSIS — G43709 Chronic migraine without aura, not intractable, without status migrainosus: Secondary | ICD-10-CM

## 2019-07-06 DIAGNOSIS — G894 Chronic pain syndrome: Secondary | ICD-10-CM

## 2019-07-06 DIAGNOSIS — I10 Essential (primary) hypertension: Secondary | ICD-10-CM

## 2019-07-06 NOTE — Chronic Care Management (AMB) (Signed)
  Care Management   Outreach Note  07/06/2019 Name: Donna [name] MRN: [id number] DOB: 26-Dec-1965  Referred by: Mitzi Hansen, MD Reason for referral : Care Coordination (HTN, chronic pain syndrome)   An unsuccessful telephone outreach was attempted today. The patient was referred to the case management team for assistance with care management and care coordination.   Follow Up Plan: The care management team will reach out to the patient again over the next 7 days.   Kelli Churn RN, CCM, [facility] RN Care Manager [phone]

## 2019-07-11 ENCOUNTER — Telehealth: Payer: Self-pay

## 2019-07-11 ENCOUNTER — Ambulatory Visit: Payer: Self-pay

## 2019-07-11 NOTE — Chronic Care Management (AMB) (Signed)
  Chronic Care Management   Outreach Note  07/11/2019 Name: Donna [name] MRN: [id number] DOB: 06-04-1965  Referred by: Mitzi Hansen, MD Reason for referral : Care Coordination (Financial Constraints)   A second unsuccessful follow up outreach was attempted today. The patient was referred to the case management team for assistance with care management and care coordination.   Follow Up Plan: A HIPPA compliant phone message was left for the patient providing contact information and requesting a return call.        Ronn Melena, [place] Coordination Social Worker [place] [phone]

## 2019-07-13 ENCOUNTER — Ambulatory Visit: Payer: Self-pay | Admitting: *Deleted

## 2019-07-13 ENCOUNTER — Telehealth: Payer: Self-pay

## 2019-07-13 DIAGNOSIS — G43709 Chronic migraine without aura, not intractable, without status migrainosus: Secondary | ICD-10-CM

## 2019-07-13 DIAGNOSIS — I10 Essential (primary) hypertension: Secondary | ICD-10-CM

## 2019-07-13 NOTE — Chronic Care Management (AMB) (Signed)
  Care Management   Outreach Note  07/13/2019 Name: Donna [name] MRN: [id number] DOB: 03-16-1965  Referred by: Mitzi Hansen, MD Reason for referral : Care Coordination (HTN, migraines)   A second unsuccessful telephone outreach was attempted today. The patient was referred to the case management team for assistance with care management and care coordination.   Follow Up Plan: A HIPPA compliant phone message was left for the patient providing contact information and requesting a return call.  The care management team will reach out to the patient again over the next 7 days.   Kelli Churn RN, CCM, [facility] RN Care Manager [phone]

## 2019-07-13 NOTE — Addendum Note (Signed)
Addended by: Hulan Fray on: 07/13/2019 06:44 PM   Modules accepted: Orders

## 2019-07-15 ENCOUNTER — Telehealth: Payer: Self-pay | Admitting: Internal Medicine

## 2019-07-15 NOTE — Telephone Encounter (Signed)
  Community Resource Referral   [place] 07/15/2019  1st Attempt  DOB: [birth year]   AGE: 54 y.o.   GENDER: female   PCP Mitzi Hansen, MD.   Called pt regarding Community Resource Referral for HELP WITH MEDICAID APPLICATION LMTCB Follow up on: 07/18/2019  [place] . [email or web address]  [phone]

## 2019-07-18 ENCOUNTER — Ambulatory Visit: Payer: Self-pay

## 2019-07-18 ENCOUNTER — Telehealth: Payer: Self-pay

## 2019-07-18 NOTE — Chronic Care Management (AMB) (Signed)
  Chronic Care Management   Outreach Note  07/18/2019 Name: Donna [name] MRN: [id number] DOB: Jul 03, 1965  Referred by: Mitzi Hansen, MD Reason for referral : Care Coordination (Financial Constraints )   Third unsuccessful telephone outreach was attempted today. The patient was referred to the case management team for assistance with care management and care coordination.  The care management team is pleased to engage with this patient at any time in the future should he/she be interested in assistance from the care management team.   Follow Up Plan: A HIPPA compliant phone message was left for the patient providing contact information and requesting a return call.        Ronn Melena, [place] Coordination Social Worker [place] [phone]

## 2019-07-19 NOTE — Telephone Encounter (Signed)
  Community Resource Referral   [place] 07/19/2019  2nd Attempt  DOB: 04-05-1965   AGE: 54 y.o.   GENDER: female   PCP Mitzi Hansen, MD.   Called pt regarding Community Resource Referral LMTCB Follow up on: 07/20/2019  [place] . [email or web address]  [phone]

## 2019-07-20 ENCOUNTER — Ambulatory Visit: Payer: Self-pay | Admitting: *Deleted

## 2019-07-20 ENCOUNTER — Telehealth: Payer: Self-pay

## 2019-07-20 DIAGNOSIS — G43709 Chronic migraine without aura, not intractable, without status migrainosus: Secondary | ICD-10-CM

## 2019-07-20 NOTE — Chronic Care Management (AMB) (Signed)
  Care Management   Outreach Note  07/20/2019 Name: Donna [name] MRN: [id number] DOB: 1965-03-14  Referred by: Mitzi Hansen, MD Reason for referral : Care Coordination (HTN, Migraines)   Third unsuccessful telephone outreach was attempted today. The patient was referred to the case management team for assistance with care management and care coordination. The patient's primary care provider has been notified of our unsuccessful attempts to make or maintain contact with the patient. The care management team is pleased to engage with this patient at any time in the future should he/she be interested in assistance from the care management team.   Follow Up Plan: The care management team is available to follow up with the patient after provider conversation with the patient regarding recommendation for care management engagement and subsequent re-referral to the care management team.   Kelli Churn RN, CCM, [facility] RN Care Manager [phone]

## 2019-07-20 NOTE — Progress Notes (Signed)
Internal Medicine Clinic Resident  I have personally reviewed this encounter including the documentation in this note and/or discussed this patient with the care management provider. I will address any urgent items identified by the care management provider and will communicate my actions to the patient's PCP. I have reviewed the patient's CCM visit with my supervising attending, Dr Vincent.  Finn Amos, MD 07/20/2019    

## 2019-07-21 NOTE — Telephone Encounter (Signed)
  Community Resource Referral   [place] 07/21/2019  3rd Attempt  DOB: 23-Oct-1965   AGE: 54 y.o.   GENDER: female   PCP Mitzi Hansen, MD.   Hulen Skains pt regarding Community Resource Referral [facility] Closing referral pending any other needs of patient. [place] . [email or web address]  [phone]  ye

## 2019-07-21 NOTE — Progress Notes (Signed)
Internal Medicine Clinic Attending  CCM services provided by the care management provider and their documentation were discussed with Dr. Chundi. We reviewed the pertinent findings, urgent action items addressed by the resident and non-urgent items to be addressed by the PCP.  I agree with the assessment, diagnosis, and plan of care documented in the CCM and resident's note.  Odell Fasching Thomas Merced Brougham, MD 07/21/2019  

## 2019-08-01 ENCOUNTER — Ambulatory Visit: Payer: Self-pay

## 2019-08-01 NOTE — Chronic Care Management (AMB) (Signed)
  Chronic Care Management   Outreach Note  08/01/2019 Name: Donna [name] MRN: [id number] DOB: 05/26/1965  Referred by: Mitzi Hansen, MD  CCM team has been unable to maintain contact with patient.    Follow Up Plan: The care management team is available to follow up with the patient after provider conversation with the patient regarding recommendation for care management engagement and subsequent re-referral to the care management team.       Ronn Melena, [place] Worker [place] [phone]

## 2019-11-23 ENCOUNTER — Encounter (HOSPITAL_COMMUNITY): Payer: Self-pay

## 2019-11-23 ENCOUNTER — Other Ambulatory Visit: Payer: Self-pay

## 2019-11-23 ENCOUNTER — Emergency Department (HOSPITAL_COMMUNITY): Payer: Self-pay

## 2019-11-23 ENCOUNTER — Emergency Department (HOSPITAL_COMMUNITY)
Admission: EM | Admit: 2019-11-23 | Discharge: 2019-11-24 | Disposition: A | Discharge status: Home / Self Care | Payer: Self-pay | Attending: Emergency Medicine | Admitting: Emergency Medicine

## 2019-11-23 DIAGNOSIS — W19XXXA Unspecified fall, initial encounter: Secondary | ICD-10-CM | POA: Insufficient documentation

## 2019-11-23 DIAGNOSIS — I10 Essential (primary) hypertension: Secondary | ICD-10-CM | POA: Insufficient documentation

## 2019-11-23 DIAGNOSIS — S300XXA Contusion of lower back and pelvis, initial encounter: Secondary | ICD-10-CM | POA: Insufficient documentation

## 2019-11-23 NOTE — ED Triage Notes (Signed)
Pt presents with c/o fall that occurred today around 11 am. Pt reports that when she fell, her legs buckled and she fell and that now she is having severe pain in her buttocks. Pt reports she has been able to put minimal pressure on that area.

## 2019-11-24 ENCOUNTER — Emergency Department (HOSPITAL_COMMUNITY): Payer: Self-pay

## 2019-11-24 MED ORDER — HYDROCODONE-ACETAMINOPHEN 5-325 MG PO TABS
1.0000 | ORAL_TABLET | Freq: Once | ORAL | Status: AC
  Administered 2019-11-24: 1 via ORAL
  Filled 2019-11-24: qty 1

## 2019-11-24 MED ORDER — HYDROCODONE-ACETAMINOPHEN 5-325 MG PO TABS: 1.0000 | ORAL_TABLET | ORAL | 0 refills | Status: DC | PRN

## 2019-11-24 NOTE — ED Provider Notes (Signed)
[facility] DEPT Provider Note   CSN: [id number] Arrival date & time: 11/23/19  1355   History Chief Complaint  Patient presents with  . Fall    Donna [name] is a 54 y.o. female.  The history is provided by the patient.  Fall  She has history of hypertension, morbid obesity, chronic back pain and states that her legs gave out of her this afternoon and she fell and is complaining of pain in her sacral area.  She has been able to stand, but only for very short periods of time.  Pain is rated at 5/10 at rest, 9/10 if she tries to move.  She has not taken anything for pain.  She denies other injury.  She states that her legs give out on her fairly frequently.  Past Medical History:  Diagnosis Date  . Anxiety   . Chronic back pain   . Fibromyalgia     Patient Active Problem List   Diagnosis Date Noted  . Anxiety associated with depression 06/13/2019  . Chronic pain syndrome 06/13/2019  . Essential hypertension 06/13/2019  . Diverticulitis 07/11/2016  . Cholelithiasis 07/11/2016  . Hepatomegaly 07/11/2016  . Esophageal reflux   . Chronic migraine without aura without status migrainosus, not intractable   . Morbid obesity due to excess calories ([place])   . Symptomatic anemia 06/14/2015  . Menorrhagia 06/14/2015    Past Surgical History:  Procedure Laterality Date  . APPENDECTOMY    . BACK SURGERY    . lumbar  fusions    . PERINEAL WOUND CLOSURE       OB History    Gravida  3   Para  2   Term  2   Preterm  0   AB  1   Living  2     SAB  1   TAB  0   Ectopic  0   Multiple  0   Live Births  2           Family History  Problem Relation Age of Onset  . Cancer Mother   . Heart disease Mother   . Stroke Mother   . Hypertension Mother   . Heart disease Father     Social History   Tobacco Use  . Smoking status: Never Smoker  . Smokeless tobacco: Never Used  Vaping Use  . Vaping Use: Never used  Substance Use  Topics  . Alcohol use: No  . Drug use: No    Home Medications Prior to Admission medications   Medication Sig Start Date End Date Taking? Authorizing Provider  acetaminophen (TYLENOL) 500 MG tablet Take 2 tablets (1,000 mg total) by mouth every 6 (six) hours as needed (for migraines). Headache Patient taking differently: Take 1,000-2,000 mg by mouth every 6 (six) hours as needed for moderate pain or headache.  06/15/15   Barton Dubois, MD  buPROPion [facility] SR) 100 MG 12 hr tablet Take 1 tablet (100 mg total) by mouth 2 (two) times daily. 06/13/19   Ladona Horns, MD  cyclobenzaprine (FLEXERIL) 10 MG tablet Take 1 tablet (10 mg total) by mouth 2 (two) times daily as needed for muscle spasms. 07/25/18   Recardo Evangelist, PA-C    Allergies    Advil [ibuprofen], Celebrex [celecoxib], and Oxycontin [oxycodone hcl]  Review of Systems   Review of Systems  All other systems reviewed and are negative.   Physical Exam Updated Vital Signs BP (!) 133/94 (BP Location: Left Arm)  Pulse 80   Temp 98.3 F (36.8 C) (Oral)   Resp 16   LMP 11/14/2019 (Approximate)   SpO2 99%   Physical Exam Vitals and nursing note reviewed.   Morbidly obese 54 year old female, resting comfortably and in no acute distress. Vital signs are significant for mildly elevated blood pressure. Oxygen saturation is 99%, which is normal. Head is normocephalic and atraumatic. PERRLA, EOMI. Oropharynx is clear. Neck is nontender and supple without adenopathy or JVD.  Xanthelasmas present. Back is nontender and there is no CVA tenderness. Lungs are clear without rales, wheezes, or rhonchi. Chest is nontender. Heart has regular rate and rhythm without murmur. Abdomen is soft, flat, nontender without masses or hepatosplenomegaly and peristalsis is normoactive. Extremities have no cyanosis or edema, full range of motion is present.  There is pain on passive flexion of the right hip but no pain on internal and external  rotation.  There is fairly well localized tenderness over the sacrum and coccyx. Skin is warm and dry without rash. Neurologic: Mental status is normal, cranial nerves are intact, there are no motor or sensory deficits.  ED Results / Procedures / Treatments   Labs (all labs ordered are listed, but only abnormal results are displayed) Labs Reviewed - No data to display  EKG None  Radiology DG Lumbar Spine Complete  Result Date: 11/23/2019 CLINICAL DATA:  Fall.  Back pain EXAM: LUMBAR SPINE - COMPLETE 4+ VIEW COMPARISON:  None. FINDINGS: Posterior lumbar fusion from L4-S1. No subluxation. No acute loss vertebral body height or disc height. IMPRESSION: No acute findings lumbar spine. Electronically Signed   By: Suzy Bouchard M.D.   On: 11/23/2019 20:06   DG Sacrum/Coccyx  Result Date: 11/23/2019 CLINICAL DATA:  Fall. EXAM: SACRUM AND COCCYX - 2+ VIEW COMPARISON:  None. FINDINGS: Posterior lumbar fusion noted. No subluxation. No displaced fracture of the sacrum or coccyx. No pelvic fracture identified. IMPRESSION: No sacral fracture or pelvic fracture identified. Electronically Signed   By: Suzy Bouchard M.D.   On: 11/23/2019 20:05   CT PELVIS WO CONTRAST  Result Date: 11/24/2019 CLINICAL DATA:  Pelvic trauma with pain in the buttocks and down the right leg. EXAM: CT PELVIS WITHOUT CONTRAST TECHNIQUE: Multidetector CT imaging of the pelvis was performed following the standard protocol without intravenous contrast. COMPARISON:  Sacral radiographs 11/23/2019 FINDINGS: Urinary Tract:  Bladder and distal ureters are unremarkable. Bowel: Visualized portions of the small and large bowel are normal in caliber. No inflammatory changes are demonstrated. Vascular/Lymphatic: Few scattered calcifications in the iliac arteries. No aneurysm. Reproductive: Uterus is mildly nodular. Possible fibroids. No abnormal adnexal masses. Other: No free air or free fluid in the pelvis. No significant lymphadenopathy.  Musculoskeletal: Postoperative changes with intervertebral disc prosthesis at L5-S1 and posterior rod and screw fixation at L4-5. Fused segments appear intact. Surgical hardware appears intact. No evidence of hardware failure. Normal alignment of the sacral coccygeal spine. No acute fracture or displacement is identified. Sacral ala are intact. Degenerative changes in the SI joints without displacement. Pelvis and hips also appear intact. No destructive or expansile bone lesions. IMPRESSION: 1. No acute fracture or displacement of the pelvis or hips. 2. Postoperative changes with intervertebral disc prosthesis at L5-S1 and posterior rod and screw fixation at L4-5. No evidence of hardware failure. Electronically Signed   By: Lucienne Capers M.D.   On: 11/24/2019 01:00   DG Knee Complete 4 Views Right  Result Date: 11/23/2019 CLINICAL DATA:  Status post fall. EXAM:  RIGHT KNEE - COMPLETE 4+ VIEW COMPARISON:  None. FINDINGS: No evidence of fracture, dislocation, or joint effusion. No evidence of arthropathy or other focal bone abnormality. Soft tissues are unremarkable. IMPRESSION: Negative. Electronically Signed   By: Virgina Norfolk M.D.   On: 11/23/2019 20:07    Procedures Procedures  Medications Ordered in ED Medications  HYDROcodone-acetaminophen (NORCO/VICODIN) 5-325 MG per tablet 1 tablet (1 tablet Oral Given 11/24/19 0100)    ED Course  I have reviewed the triage vital signs and the nursing notes.  Pertinent labs & imaging results that were available during my care of the patient were reviewed by me and considered in my medical decision making (see chart for details).  MDM Rules/Calculators/A&P Fall with injury to the sacrum and coccyx.  X-rays of lumbar spine, right knee, sacrum and coccyx are negative for fracture.  Because of severity of pain, will send for CT of pelvis to rule out occult fracture.  Old records are reviewed, and she does have a prior ED visit for a fall with similar  mechanism of injury.  CT scan shows no fracture.  Patient was able to ambulate in the emergency department and is felt to be safe for discharge.  She is advised on applying ice, use over-the-counter analgesics as needed for pain.  Given prescription for small number of hydrocodone-acetaminophen tablets as needed for severe pain.  Follow-up with PCP.  Final Clinical Impression(s) / ED Diagnoses Final diagnoses:  Fall, initial encounter  Contusion of sacrum, initial encounter    Rx / DC Orders ED Discharge Orders         Ordered    HYDROcodone-acetaminophen (NORCO) 5-325 MG tablet  Every 4 hours PRN        11/24/19 9798           Delora Fuel, MD 92/11/94 0148

## 2019-11-24 NOTE — Discharge Instructions (Addendum)
Apply ice for 30 minutes at a time, 4 times a day.  Take ibuprofen and/or acetaminophen as needed for pain.  Reserve hydrocodone-acetaminophen for severe pain.

## 2019-11-30 ENCOUNTER — Ambulatory Visit: Payer: Self-pay

## 2019-11-30 NOTE — Chronic Care Management (AMB) (Signed)
CCM status changed to previously enrolled due to inability to maintain contact with pt.   Ronn Melena, [place] Coordination Social Worker [place] [phone]

## 2019-12-08 ENCOUNTER — Other Ambulatory Visit: Payer: Self-pay

## 2019-12-08 ENCOUNTER — Emergency Department (HOSPITAL_COMMUNITY)
Admission: EM | Admit: 2019-12-08 | Discharge: 2019-12-09 | Disposition: A | Discharge status: Home / Self Care | Payer: Self-pay | Attending: Emergency Medicine | Admitting: Emergency Medicine

## 2019-12-08 ENCOUNTER — Encounter (HOSPITAL_COMMUNITY): Payer: Self-pay

## 2019-12-08 DIAGNOSIS — R1032 Left lower quadrant pain: Secondary | ICD-10-CM | POA: Insufficient documentation

## 2019-12-08 DIAGNOSIS — M5431 Sciatica, right side: Secondary | ICD-10-CM

## 2019-12-08 DIAGNOSIS — G8929 Other chronic pain: Secondary | ICD-10-CM | POA: Insufficient documentation

## 2019-12-08 DIAGNOSIS — G8918 Other acute postprocedural pain: Secondary | ICD-10-CM | POA: Insufficient documentation

## 2019-12-08 DIAGNOSIS — M5441 Lumbago with sciatica, right side: Secondary | ICD-10-CM | POA: Insufficient documentation

## 2019-12-08 DIAGNOSIS — Z981 Arthrodesis status: Secondary | ICD-10-CM | POA: Insufficient documentation

## 2019-12-08 DIAGNOSIS — I10 Essential (primary) hypertension: Secondary | ICD-10-CM | POA: Insufficient documentation

## 2019-12-08 MED ORDER — METHOCARBAMOL 1000 MG/10ML IJ SOLN
1000.0000 mg | Freq: Once | INTRAVENOUS | Status: AC
  Administered 2019-12-09: 1000 mg via INTRAVENOUS
  Filled 2019-12-08: qty 1000

## 2019-12-08 MED ORDER — DEXAMETHASONE SODIUM PHOSPHATE 10 MG/ML IJ SOLN
10.0000 mg | Freq: Once | INTRAMUSCULAR | Status: AC
  Administered 2019-12-09: 10 mg via INTRAVENOUS
  Filled 2019-12-08: qty 1

## 2019-12-08 MED ORDER — METHOCARBAMOL 1000 MG/10ML IJ SOLN: 1000.0000 mg | Freq: Once | INTRAMUSCULAR | Status: DC

## 2019-12-08 NOTE — ED Triage Notes (Signed)
Pt arrives [facility] for pain from the fall on 9/29. Lower back pain radiating down right leg to knee. Pt also c/o LLQ abdominal pain. Pt admits she is having menstrual cramps but says she heard it could be diverticulitis.

## 2019-12-09 LAB — CBC WITH DIFFERENTIAL/PLATELET
Abs Immature Granulocytes: 0.04 10*3/uL (ref 0.00–0.07)
Basophils Absolute: 0.1 10*3/uL (ref 0.0–0.1)
Basophils Relative: 1 %
Eosinophils Absolute: 0.1 10*3/uL (ref 0.0–0.5)
Eosinophils Relative: 1 %
HCT: 43.5 % (ref 36.0–46.0)
Hemoglobin: 13.7 g/dL (ref 12.0–15.0)
Immature Granulocytes: 0 %
Lymphocytes Relative: 26 %
Lymphs Abs: 2.8 10*3/uL (ref 0.7–4.0)
MCH: 29.1 pg (ref 26.0–34.0)
MCHC: 31.5 g/dL (ref 30.0–36.0)
MCV: 92.6 fL (ref 80.0–100.0)
Monocytes Absolute: 0.8 10*3/uL (ref 0.1–1.0)
Monocytes Relative: 8 %
Neutro Abs: 6.7 10*3/uL (ref 1.7–7.7)
Neutrophils Relative %: 64 %
Platelets: 276 10*3/uL (ref 150–400)
RBC: 4.7 MIL/uL (ref 3.87–5.11)
RDW: 14 % (ref 11.5–15.5)
WBC: 10.5 10*3/uL (ref 4.0–10.5)
nRBC: 0 % (ref 0.0–0.2)

## 2019-12-09 LAB — COMPREHENSIVE METABOLIC PANEL
ALT: 16 U/L (ref 0–44)
AST: 34 U/L (ref 15–41)
Albumin: 3.4 g/dL — ABNORMAL LOW (ref 3.5–5.0)
Alkaline Phosphatase: 70 U/L (ref 38–126)
Anion gap: 8 (ref 5–15)
BUN: 13 mg/dL (ref 6–20)
CO2: 23 mmol/L (ref 22–32)
Calcium: 8.5 mg/dL — ABNORMAL LOW (ref 8.9–10.3)
Chloride: 106 mmol/L (ref 98–111)
Creatinine, Ser: 0.83 mg/dL (ref 0.44–1.00)
GFR, Estimated: >60 mL/min (ref >60)
Glucose, Bld: 117 mg/dL — ABNORMAL HIGH (ref 70–99)
Potassium: 5 mmol/L (ref 3.5–5.1)
Sodium: 137 mmol/L (ref 135–145)
Total Bilirubin: 1.6 mg/dL — ABNORMAL HIGH (ref 0.3–1.2)
Total Protein: 7 g/dL (ref 6.5–8.1)

## 2019-12-09 LAB — LIPASE, BLOOD: Lipase: 25 U/L (ref 11–51)

## 2019-12-09 MED ORDER — HALOPERIDOL LACTATE 5 MG/ML IJ SOLN
2.0000 mg | Freq: Once | INTRAMUSCULAR | Status: AC
  Administered 2019-12-09: 2 mg via INTRAVENOUS
  Filled 2019-12-09: qty 1

## 2019-12-09 MED ORDER — DIPHENHYDRAMINE HCL 50 MG/ML IJ SOLN
12.5000 mg | Freq: Once | INTRAMUSCULAR | Status: AC
  Administered 2019-12-09: 12.5 mg via INTRAVENOUS
  Filled 2019-12-09: qty 1

## 2019-12-09 NOTE — ED Provider Notes (Signed)
[facility] DEPT Provider Note  CSN: [id number] Arrival date & time: 12/08/19 2317  Chief Complaint(s) Fall 2 week ago and Abdominal Pain  HPI Donna [name] is a 54 y.o. female with a past medical history listed below including chronic back pain status post lumbar fixation, who had a mechanical fall on September 29.  She was evaluated that day and had negative plain films and CT of her pelvis.  She returns today for persistent right buttock pain that radiates down her right leg down to her knee.  Pain is worse with movement and weightbearing.  She denies any additional falls.  No bladder/bowel incontinence though she does report she is sometimes unable to make it to the bathroom.  Additionally patient endorses left lower quadrant abdominal discomfort for several days.  Reports that this is similar to prior pain which she was told was diverticulitis.  No nausea or vomiting.  No urinary symptoms.  No other physical complaints.  HPI  Past Medical History Past Medical History:  Diagnosis Date  . Anxiety   . Chronic back pain   . Fibromyalgia    Patient Active Problem List   Diagnosis Date Noted  . Anxiety associated with depression 06/13/2019  . Chronic pain syndrome 06/13/2019  . Essential hypertension 06/13/2019  . Diverticulitis 07/11/2016  . Cholelithiasis 07/11/2016  . Hepatomegaly 07/11/2016  . Esophageal reflux   . Chronic migraine without aura without status migrainosus, not intractable   . Morbid obesity due to excess calories ([place])   . Symptomatic anemia 06/14/2015  . Menorrhagia 06/14/2015   Home Medication(s) Prior to Admission medications   Medication Sig Start Date End Date Taking? Authorizing Provider  acetaminophen (TYLENOL) 500 MG tablet Take 2 tablets (1,000 mg total) by mouth every 6 (six) hours as needed (for migraines). Headache Patient taking differently: Take 1,000-2,000 mg by mouth every 6 (six) hours as needed for moderate pain  or headache.  06/15/15  Yes Barton Dubois, MD  buPROPion [facility] SR) 100 MG 12 hr tablet Take 1 tablet (100 mg total) by mouth 2 (two) times daily. Patient not taking: Reported on 12/09/2019 06/13/19   Ladona Horns, MD  HYDROcodone-acetaminophen [facility]) 5-325 MG tablet Take 1 tablet by mouth every 4 (four) hours as needed for moderate pain. Patient not taking: Reported on 61/95/0932 6/71/24   Delora Fuel, MD                                                                                                                                    Past Surgical History Past Surgical History:  Procedure Laterality Date  . APPENDECTOMY    . BACK SURGERY    . lumbar  fusions    . PERINEAL WOUND CLOSURE     Family History Family History  Problem Relation Age of Onset  . Cancer Mother   . Heart disease Mother   . Stroke Mother   .  Hypertension Mother   . Heart disease Father     Social History Social History   Tobacco Use  . Smoking status: Never Smoker  . Smokeless tobacco: Never Used  Vaping Use  . Vaping Use: Never used  Substance Use Topics  . Alcohol use: No  . Drug use: No   Allergies Advil [ibuprofen], Celebrex [celecoxib], and Oxycontin [oxycodone hcl]  Review of Systems Review of Systems All other systems are reviewed and are negative for acute change except as noted in the HPI  Physical Exam Vital Signs  I have reviewed the triage vital signs BP (!) 128/112   Pulse 100   Temp 98.2 F (36.8 C) (Oral)   Resp 20   Ht 5\' 8"  (1.727 m)   Wt (!) 146 kg   LMP 11/14/2019 (Approximate)   SpO2 98%   BMI 48.94 kg/m   Physical Exam Vitals reviewed.  Constitutional:      General: She is not in acute distress.    Appearance: She is well-developed. She is morbidly obese. She is not diaphoretic.  HENT:     Head: Normocephalic and atraumatic.     Right Ear: External ear normal.     Left Ear: External ear normal.     Nose: Nose normal.  Eyes:     General: No scleral  icterus.    Conjunctiva/sclera: Conjunctivae normal.  Neck:     Trachea: Phonation normal.  Cardiovascular:     Rate and Rhythm: Normal rate and regular rhythm.  Pulmonary:     Effort: Pulmonary effort is normal. No respiratory distress.     Breath sounds: No stridor.  Abdominal:     General: There is no distension.     Tenderness: There is abdominal tenderness in the left lower quadrant. There is no guarding or rebound.     Comments: Striae to the abdomen  Musculoskeletal:        General: Normal range of motion.     Cervical back: Normal range of motion.     Right hip: Tenderness present. No deformity or bony tenderness. Normal range of motion.     Right upper leg: Tenderness present. No swelling, edema, deformity or bony tenderness.     Right foot: Normal pulse.     Left foot: Normal pulse.       Legs:  Neurological:     Mental Status: She is alert and oriented to person, place, and time.  Psychiatric:        Behavior: Behavior normal.     ED Results and Treatments Labs (all labs ordered are listed, but only abnormal results are displayed) Labs Reviewed  COMPREHENSIVE METABOLIC PANEL - Abnormal; Notable for the following components:      Result Value   Glucose, Bld 117 (*)    Calcium 8.5 (*)    Albumin 3.4 (*)    Total Bilirubin 1.6 (*)    All other components within normal limits  CBC WITH DIFFERENTIAL/PLATELET  LIPASE, BLOOD  EKG  EKG Interpretation  Date/Time:    Ventricular Rate:    PR Interval:    QRS Duration:   QT Interval:    QTC Calculation:   R Axis:     Text Interpretation:        Radiology No results found.  Pertinent labs & imaging results that were available during my care of the patient were reviewed by me and considered in my medical decision making (see chart for details).  Medications Ordered in ED Medications    dexamethasone (DECADRON) injection 10 mg (10 mg Intravenous Given 12/09/19 0019)  methocarbamol (ROBAXIN) 1,000 mg in dextrose 5 % 100 mL IVPB (0 mg Intravenous Stopped 12/09/19 0107)  haloperidol lactate (HALDOL) injection 2 mg (2 mg Intravenous Given 12/09/19 0205)  diphenhydrAMINE (BENADRYL) injection 12.5 mg (12.5 mg Intravenous Given 12/09/19 0204)                                                                                                                                    Procedures Procedures  (including critical care time)  Medical Decision Making / ED Course I have reviewed the nursing notes for this encounter and the patient's prior records (if available in EHR or on provided paperwork).   KYLANI WIRES was evaluated in Emergency Department on 12/09/2019 for the symptoms described in the history of present illness. She was evaluated in the context of the global COVID-19 pandemic, which necessitated consideration that the patient might be at risk for infection with the SARS-CoV-2 virus that causes COVID-19. Institutional protocols and algorithms that pertain to the evaluation of patients at risk for COVID-19 are in a state of rapid change based on information released by regulatory bodies including the CDC and federal and state organizations. These policies and algorithms were followed during the patient's care in the ED.  1. Right leg pain Patient had negative work-up after a fall including negative CT pelvis.  Symptoms appear to be consistent with sciatica.  No symptoms or evidence concerning for cauda equina requiring imaging at this time.  2.  Left lower quadrant abdominal discomfort.  Patient has very mild discomfort to palpation in the left lower quadrant.  No signs of peritonitis. Labs grossly reassuring without leukocytosis or anemia.  No significant electrolyte derangements or renal insufficiency.  No evidence of bili obstruction or pancreatitis.  Low suspicion for serious  intra-abdominal inflammatory/infectious process, bowel obstruction, torsion.  Treated symptomatically.  Patient able to tolerate oral intake.  Pain completely resolved following medication as above.      Final Clinical Impression(s) / ED Diagnoses Final diagnoses:  Sciatica of right side  Left lower quadrant abdominal pain   The patient appears reasonably screened and/or stabilized for discharge and I doubt any other medical condition or other [facility] requiring further screening, evaluation, or treatment in the ED at this time prior to discharge. Safe for discharge with strict return precautions.  Disposition: Discharge  Condition: Good  I have discussed the results, Dx and Tx plan with the patient/family who expressed understanding and agree(s) with the plan. Discharge instructions discussed at length. The patient/family was given strict return precautions who verbalized understanding of the instructions. No further questions at time of discharge.    ED Discharge Orders    None        Follow Up: Primary care provider  Schedule an appointment as soon as possible for a visit  If you do not have a primary care physician, contact [facility] at [phone] for referral      This chart was dictated using voice recognition software.  Despite best efforts to proofread,  errors can occur which can change the documentation meaning.   Fatima Blank, MD 12/09/19 [phone]

## 2020-01-15 ENCOUNTER — Emergency Department (HOSPITAL_COMMUNITY): Payer: Self-pay

## 2020-01-15 ENCOUNTER — Emergency Department (HOSPITAL_COMMUNITY)
Admission: EM | Admit: 2020-01-15 | Discharge: 2020-01-16 | Disposition: A | Discharge status: Home / Self Care | Payer: Self-pay | Attending: Emergency Medicine | Admitting: Emergency Medicine

## 2020-01-15 DIAGNOSIS — W010XXA Fall on same level from slipping, tripping and stumbling without subsequent striking against object, initial encounter: Secondary | ICD-10-CM | POA: Insufficient documentation

## 2020-01-15 DIAGNOSIS — R519 Headache, unspecified: Secondary | ICD-10-CM | POA: Insufficient documentation

## 2020-01-15 DIAGNOSIS — M25551 Pain in right hip: Secondary | ICD-10-CM | POA: Insufficient documentation

## 2020-01-15 DIAGNOSIS — Z79899 Other long term (current) drug therapy: Secondary | ICD-10-CM | POA: Insufficient documentation

## 2020-01-15 DIAGNOSIS — I10 Essential (primary) hypertension: Secondary | ICD-10-CM | POA: Insufficient documentation

## 2020-01-15 DIAGNOSIS — M79604 Pain in right leg: Secondary | ICD-10-CM | POA: Insufficient documentation

## 2020-01-15 DIAGNOSIS — Z9089 Acquired absence of other organs: Secondary | ICD-10-CM | POA: Insufficient documentation

## 2020-01-15 DIAGNOSIS — W19XXXA Unspecified fall, initial encounter: Secondary | ICD-10-CM

## 2020-01-15 DIAGNOSIS — M545 Low back pain, unspecified: Secondary | ICD-10-CM | POA: Insufficient documentation

## 2020-01-15 DIAGNOSIS — S0990XA Unspecified injury of head, initial encounter: Secondary | ICD-10-CM | POA: Insufficient documentation

## 2020-01-15 NOTE — ED Triage Notes (Signed)
Pt arrived [facility] from home after pt slipped on oil in her kitchen and fell on her right hip. Unknown shortening of R leg to to pt wont lay on her back.  While taking pt upstairs pt was dropped by EMS and her head hit the concrete. Pt now has a hematoma forming on her head. Pt received 214mcg of fentanyl by EMS and 4mg  of zofran

## 2020-01-15 NOTE — ED Provider Notes (Signed)
[facility] Provider Note   CSN: [id number] Arrival date & time: 01/15/20  1923     History Chief Complaint  Patient presents with  . Fall    PERIAN TEDDER is a 54 y.o. female.  HPI      54yo female with history of hypertension, chronic pain, presents with concern for fall with right hip pain as well as headache that developed after head injury sustained as [facility] was attempting to transport her out of her residence.  Patient reports her cat knocked over a bucket of oil that was on the floor and she slipped falling on her right hip. No LOC or head injury.  When EMS was attempting transport she fell, hitting her head on the bricks.    Reports severe right/proximal right leg pain radiating from hip down side of leg.  Has chronic pain of back but feels lower back pain is worse. Now has headache which is also severe. Reports room spinning when she turns her head and nausea.  Not on blood thinneres. No other acute medical concerns. No numbness or weakness  Past Medical History:  Diagnosis Date  . Anxiety   . Chronic back pain   . Fibromyalgia     Patient Active Problem List   Diagnosis Date Noted  . Anxiety associated with depression 06/13/2019  . Chronic pain syndrome 06/13/2019  . Essential hypertension 06/13/2019  . Diverticulitis 07/11/2016  . Cholelithiasis 07/11/2016  . Hepatomegaly 07/11/2016  . Esophageal reflux   . Chronic migraine without aura without status migrainosus, not intractable   . Morbid obesity due to excess calories ([place])   . Symptomatic anemia 06/14/2015  . Menorrhagia 06/14/2015    Past Surgical History:  Procedure Laterality Date  . APPENDECTOMY    . BACK SURGERY    . lumbar  fusions    . PERINEAL WOUND CLOSURE       OB History    Gravida  3   Para  2   Term  2   Preterm  0   AB  1   Living  2     SAB  1   TAB  0   Ectopic  0   Multiple  0   Live Births  2           Family  History  Problem Relation Age of Onset  . Cancer Mother   . Heart disease Mother   . Stroke Mother   . Hypertension Mother   . Heart disease Father     Social History   Tobacco Use  . Smoking status: Never Smoker  . Smokeless tobacco: Never Used  Vaping Use  . Vaping Use: Never used  Substance Use Topics  . Alcohol use: No  . Drug use: No    Home Medications Prior to Admission medications   Medication Sig Start Date End Date Taking? Authorizing Provider  acetaminophen (TYLENOL) 500 MG tablet Take 2 tablets (1,000 mg total) by mouth every 6 (six) hours as needed (for migraines). Headache Patient taking differently: Take 1,000-2,000 mg by mouth every 6 (six) hours as needed for moderate pain or headache.  06/15/15   Barton Dubois, MD  docusate sodium (COLACE) 100 MG capsule Take 1 capsule (100 mg total) by mouth every 12 (twelve) hours. 01/16/20   Ward, Delice Bison, DO  HYDROcodone-acetaminophen (NORCO/VICODIN) 5-325 MG tablet Take 2 tablets by mouth every 6 (six) hours as needed. 01/16/20   Ward, Delice Bison, DO  ondansetron (ZOFRAN ODT) 4 MG disintegrating tablet Take 1 tablet (4 mg total) by mouth every 6 (six) hours as needed. 01/16/20   Ward, Delice Bison, DO  buPROPion (WELLBUTRIN SR) 100 MG 12 hr tablet Take 1 tablet (100 mg total) by mouth 2 (two) times daily. Patient not taking: Reported on 12/09/2019 06/13/19 01/16/20  Ladona Horns, MD    Allergies    Advil [ibuprofen], Celebrex [celecoxib], and Oxycontin [oxycodone hcl]  Review of Systems   Review of Systems  Constitutional: Negative for fever.  HENT: Negative for sore throat.   Eyes: Negative for visual disturbance.  Respiratory: Negative for cough and shortness of breath.   Cardiovascular: Negative for chest pain.  Gastrointestinal: Positive for nausea. Negative for abdominal pain and vomiting.  Genitourinary: Negative for difficulty urinating.  Musculoskeletal: Positive for arthralgias, back pain, gait problem and  neck pain.  Skin: Negative for rash.  Neurological: Positive for dizziness and headaches. Negative for syncope, speech difficulty, weakness and numbness.    Physical Exam Updated Vital Signs BP (!) 112/59 (BP Location: Right Arm)   Pulse 82   Temp 98.4 F (36.9 C) (Oral)   Resp 20   SpO2 98%   Physical Exam Vitals and nursing note reviewed.  Constitutional:      General: She is not in acute distress.    Appearance: She is well-developed. She is not diaphoretic.  HENT:     Head: Normocephalic.     Comments: Hematoma posterior scalp Eyes:     Conjunctiva/sclera: Conjunctivae normal.  Cardiovascular:     Rate and Rhythm: Normal rate and regular rhythm.  Pulmonary:     Effort: Pulmonary effort is normal. No respiratory distress.  Abdominal:     General: There is no distension.     Palpations: Abdomen is soft.     Tenderness: There is no abdominal tenderness. There is no guarding.  Musculoskeletal:        General: No tenderness.     Cervical back: Normal range of motion.     Comments: Tenderness right hip, proximal femur, muscle spasm, pain around greater trochanter, some tenderness around righ tknee iffusely, tender CSpine, lumbar spine  Able to flex/extend knee/hip on right   Skin:    General: Skin is warm and dry.     Findings: No erythema or rash.  Neurological:     Mental Status: She is alert and oriented to person, place, and time.     GCS: GCS eye subscore is 4. GCS verbal subscore is 5. GCS motor subscore is 6.     Cranial Nerves: Cranial nerves are intact. No cranial nerve deficit, dysarthria or facial asymmetry.     Sensory: Sensation is intact. No sensory deficit.     Motor: Motor function is intact. No weakness (strength right hip/knee limited by pain).     Coordination: Finger-Nose-Finger Test normal. Impaired heel to shin: difficulty performing due to right leg pain on right.     ED Results / Procedures / Treatments   Labs (all labs ordered are listed, but  only abnormal results are displayed) Labs Reviewed - No data to display  EKG None  Radiology CT Head Wo Contrast  Result Date: 01/15/2020 CLINICAL DATA:  Head trauma EXAM: CT HEAD WITHOUT CONTRAST TECHNIQUE: Contiguous axial images were obtained from the base of the skull through the vertex without intravenous contrast. COMPARISON:  CT brain 07/25/2018 FINDINGS: Brain: No evidence of acute infarction, hemorrhage, hydrocephalus, extra-axial collection or mass lesion/mass effect. Vascular: No hyperdense  vessel or unexpected calcification. Skull: Normal. Negative for fracture or focal lesion. Sinuses/Orbits: Mucosal thickening in the sphenoid and ethmoid sinuses Other: None IMPRESSION: Negative non contrasted CT appearance of the brain. Electronically Signed   By: Donavan Foil M.D.   On: 01/15/2020 21:11   CT CERVICAL SPINE WO CONTRAST  Result Date: 01/15/2020 CLINICAL DATA:  Fall hit head on concrete EXAM: CT CERVICAL SPINE WITHOUT CONTRAST TECHNIQUE: Multidetector CT imaging of the cervical spine was performed without intravenous contrast. Multiplanar CT image reconstructions were also generated. COMPARISON:  CT 07/25/2018 FINDINGS: Alignment: Straightening of the cervical spine. No subluxation. Facet alignment is maintained. Skull base and vertebrae: No acute fracture. No primary bone lesion or focal pathologic process. Soft tissues and spinal canal: No prevertebral fluid or swelling. No visible canal hematoma. Disc levels:  Disc spaces are relatively maintained. Upper chest: Negative. Other: None IMPRESSION: Straightening of the cervical spine. No acute osseous abnormality. Electronically Signed   By: Donavan Foil M.D.   On: 01/15/2020 22:38   CT Lumbar Spine Wo Contrast  Result Date: 01/16/2020 CLINICAL DATA:  Low back pain EXAM: CT LUMBAR SPINE WITHOUT CONTRAST TECHNIQUE: Multidetector CT imaging of the lumbar spine was performed without intravenous contrast administration. Multiplanar CT  image reconstructions were also generated. COMPARISON:  CT lumbar spine dated Jul 25, 2018 FINDINGS: Segmentation: 5 lumbar type vertebrae. Alignment: Normal. Vertebrae: There is no acute osseous abnormality involving the lumbar spine. The patient is status post prior L4-L5 posterior fusion. There is an interbody spacer at the L5-S1 level that appears grossly well positioned. Paraspinal and other soft tissues: Negative. Disc levels: Mild disc height loss is noted throughout the lumbar spine. IMPRESSION: No acute abnormality.  Chronic changes as detailed above. Electronically Signed   By: Constance Holster M.D.   On: 01/16/2020 01:09   CT Hip Right Wo Contrast  Result Date: 01/16/2020 CLINICAL DATA:  Hip fracture suspected. EXAM: CT OF THE RIGHT HIP WITHOUT CONTRAST TECHNIQUE: Multidetector CT imaging of the right hip was performed according to the standard protocol. Multiplanar CT image reconstructions were also generated. COMPARISON:  None. FINDINGS: Bones/Joint/Cartilage There is no acute displaced fracture. Mild degenerative changes are noted of the right hip. Ligaments Suboptimally assessed by CT. Muscles and Tendons There is no acute intramuscular hematoma. Soft tissues The visualized portions of the patient's pelvis are unremarkable. IMPRESSION: 1. No acute displaced fracture or dislocation. 2. Mild degenerative changes of the right hip. Electronically Signed   By: Constance Holster M.D.   On: 01/16/2020 01:03   DG Knee Complete 4 Views Right  Result Date: 01/15/2020 CLINICAL DATA:  Fall EXAM: RIGHT KNEE - COMPLETE 4+ VIEW COMPARISON:  None. FINDINGS: No fracture or malalignment. Joint spaces are maintained. No significant knee effusion IMPRESSION: Negative. Electronically Signed   By: Donavan Foil M.D.   On: 01/15/2020 23:21   DG Hip Unilat W or Wo Pelvis 2-3 Views Right  Result Date: 01/15/2020 CLINICAL DATA:  Fall on right side, hip pain EXAM: DG HIP (WITH OR WITHOUT PELVIS) 2-3V RIGHT  COMPARISON:  None. FINDINGS: There is no evidence of hip fracture or dislocation. There is no evidence of arthropathy or other focal bone abnormality. IMPRESSION: No definite acute osseous fracture. If there is high clinical suspicion for occult hip fracture or the patient refuses to weightbear, consider further evaluation with MRI. Although CT is expeditious, evidence is lacking regarding accuracy of CT over plain film radiography. Electronically Signed   By: Ebony Cargo.D.  On: 01/15/2020 20:01    Procedures Procedures (including critical care time)  Medications Ordered in ED Medications  fentaNYL (SUBLIMAZE) injection 150 mcg (150 mcg Intramuscular Given 01/16/20 0102)  morphine 4 MG/ML injection 4 mg (4 mg Intravenous Given 01/16/20 0216)  ondansetron (ZOFRAN) injection 4 mg (4 mg Intravenous Given 01/16/20 0216)  morphine 4 MG/ML injection 4 mg (4 mg Intravenous Given 01/16/20 0356)    ED Course  I have reviewed the triage vital signs and the nursing notes.  Pertinent labs & imaging results that were available during my care of the patient were reviewed by me and considered in my medical decision making (see chart for details).    MDM Rules/Calculators/A&P                          54yo female with history of hypertension, chronic pain, presents with concern for fall with right hip pain as well as headache that developed after head injury sustained as EMS was attempting to transport her out of her residence.  CT head and CSpine without acute injuries.  No abnormalities on neurologic exam and doubt carotid/vertebral dissection. Suspect dizziness and nausea related to concussion in setting of headache and head injury.   Has right hip pain and tenderness. XR hip and knee negative for fracture. Pain proximal leg, able to lift leg and stand and given visualized areas of hip and knee XR and area of pain more proximally primarily doubt other femur fracture.  She is having continued pain  with weight bearing and unable to ambulate and will obtain CT hip and lumbar spine to evaluate for other occult hip fx or lumbar fx.   She lives with her son, uses [employer] for long distances, walks short distances around home.     Final Clinical Impression(s) / ED Diagnoses Final diagnoses:  Fall, initial encounter  Injury of head, initial encounter  Right hip pain    Rx / DC Orders ED Discharge Orders         Ordered    HYDROcodone-acetaminophen (NORCO/VICODIN) 5-325 MG tablet  Every 6 hours PRN        01/16/20 0300    ondansetron (ZOFRAN ODT) 4 MG disintegrating tablet  Every 6 hours PRN        01/16/20 0300    docusate sodium (COLACE) 100 MG capsule  Every 12 hours        01/16/20 0300           Gareth Morgan, MD 01/16/20 1212

## 2020-01-16 ENCOUNTER — Emergency Department (HOSPITAL_COMMUNITY): Payer: Self-pay

## 2020-01-16 ENCOUNTER — Other Ambulatory Visit: Payer: Self-pay

## 2020-01-16 MED ORDER — HYDROCODONE-ACETAMINOPHEN 5-325 MG PO TABS: 2.0000 | ORAL_TABLET | Freq: Four times a day (QID) | ORAL | 0 refills | Status: DC | PRN

## 2020-01-16 MED ORDER — FENTANYL CITRATE (PF) 100 MCG/2ML IJ SOLN
150.0000 ug | Freq: Once | INTRAMUSCULAR | Status: AC
  Administered 2020-01-16: 150 ug via INTRAMUSCULAR
  Filled 2020-01-16: qty 4

## 2020-01-16 MED ORDER — MORPHINE SULFATE (PF) 4 MG/ML IV SOLN
4.0000 mg | Freq: Once | INTRAVENOUS | Status: AC
  Administered 2020-01-16: 4 mg via INTRAVENOUS
  Filled 2020-01-16: qty 1

## 2020-01-16 MED ORDER — ONDANSETRON HCL 4 MG/2ML IJ SOLN
4.0000 mg | Freq: Once | INTRAMUSCULAR | Status: AC
  Administered 2020-01-16: 4 mg via INTRAVENOUS
  Filled 2020-01-16: qty 2

## 2020-01-16 MED ORDER — DOCUSATE SODIUM 100 MG PO CAPS: 100.0000 mg | ORAL_CAPSULE | Freq: Two times a day (BID) | ORAL | 0 refills | Status: DC

## 2020-01-16 MED ORDER — ONDANSETRON 4 MG PO TBDP: 4.0000 mg | ORAL_TABLET | Freq: Four times a day (QID) | ORAL | 0 refills | Status: DC | PRN

## 2020-01-16 NOTE — ED Notes (Signed)
Pt c/o pain in her leg  Pain med given iv

## 2020-01-16 NOTE — ED Notes (Signed)
Called [facility] for transport home.  

## 2020-01-16 NOTE — ED Provider Notes (Signed)
12:15 AM  Assumed care.  Patient is a 53 year old female who presents to the emergency department after a fall.  Complaining of right hip pain.  CT right hip and lumbar spine pending.  Uses a walker at baseline also has a wheelchair at home.  Lives with her son.  She did hit her head and has some vertiginous symptoms likely postconcussive in nature.  Head CT shows no acute abnormality.  Currently unable to bear weight in the emergency department.  Will give pain medicine and reassess.  1:40 AM  Pt's CTs show no acute abnormality.  Still complaining of headache and hip pain and requesting more pain medicine prior to trying to ambulate again.  Morphine, Zofran ordered.  3:00 AM  Pt reports some improvement in pain after IV pain medication but still having discomfort when bearing weight.  She was able to stand at the side of the bed.  She states she does have a wheelchair at home and her son lives with her so therefore she feels comfortable going home as long as we discharged her with pain medication.  Discussed return precautions.  Pain medicine and nausea medicine sent to her pharmacy.  She will be transferred at home by [facility].     At this time, I do not feel there is any life-threatening condition present. I have reviewed, interpreted and discussed all results (EKG, imaging, lab, urine as appropriate) and exam findings with patient/family. I have reviewed nursing notes and appropriate previous records.  I feel the patient is safe to be discharged home without further emergent workup and can continue workup as an outpatient as needed. Discussed usual and customary return precautions. Patient/family verbalize understanding and are comfortable with this plan.  Outpatient follow-up has been provided as needed. All questions have been answered.    Harutyun Monteverde, Delice Bison, DO 01/16/20 0301

## 2020-01-16 NOTE — ED Notes (Signed)
Attempted to ambulate pt. Pt was able to stand on her own with walker. Sat down. Stood up again and took apx 2 steps forward before stepping back to sit on bed

## 2020-01-16 NOTE — Discharge Instructions (Addendum)
You have had a head injury resulting in a concussion.  Please avoid alcohol, sedatives for the next week.  Please rest and drink plenty of water.  We recommend that you avoid any activity that may lead to another head injury for at least 1 week or until your symptoms have completely resolved.  We also recommend "brain rest" - please avoid TV, cell phones, tablets, computers as much as possible for the next 48 hours.     You are being provided a prescription for opiates (also known as narcotics) for pain control.  Opiates can be addictive and should only be used when absolutely necessary for pain control when other alternatives do not work.  We recommend you only use them for the recommended amount of time and only as prescribed.  Please do not take with other sedative medications or alcohol.  Please do not drive, operate machinery, make important decisions while taking opiates.  Please note that these medications can be addictive and have high abuse potential.  Patients can become addicted to narcotics after only taking them for a few days.  Please keep these medications locked away from children, teenagers or any family members with history of substance abuse.  Narcotic pain medicine may also make you constipated.  You may use over-the-counter medications such as MiraLAX, Colace to prevent constipation.  If you become constipated you may use over-the-counter enemas as needed.  Itching and nausea are common side effects of narcotic pain medication.  If you develop uncontrolled vomiting or a rash, please stop these medications.

## 2020-01-16 NOTE — ED Notes (Signed)
Pt declined attempt to ambulate saying she was in too much pain. MD notified

## 2020-02-02 ENCOUNTER — Other Ambulatory Visit: Payer: Self-pay

## 2020-02-02 DIAGNOSIS — N6310 Unspecified lump in the right breast, unspecified quadrant: Secondary | ICD-10-CM

## 2020-02-06 ENCOUNTER — Telehealth: Payer: Self-pay | Admitting: Internal Medicine

## 2020-02-06 NOTE — Telephone Encounter (Signed)
   Donna [name] DOB: 1965-04-12 MRN: [id number]   RIDER WAIVER AND RELEASE OF LIABILITY  For purposes of improving physical access to our facilities, [facility] is pleased to partner with third parties to provide [facility] patients or other authorized individuals the option of convenient, on-demand ground transportation services (the [employer] bre[employer]") through use of the technology service that enables users to request on-demand ground transportation from independent third-party providers.  By opting to use and accept these [employer], I, the undersigned, hereby agree on behalf of myself, and on behalf of any minor child using the [employer] for whom I am the parent or legal guardian, as follows:  1. Government social research officer provided to me are provided by independent third-party transportation providers who are not [employer] or employees and who are unaffiliated with [employer]. 2. [facility] is neither a transportation carrier nor a common or public carrier. 3. [facility] has no control over the quality or safety of the transportation that occurs as a result of the [employer]. 4. [facility] cannot guarantee that any third-party transportation provider will complete any arranged transportation service. 5. [facility] makes no representation, warranty, or guarantee regarding the reliability, timeliness, quality, safety, suitability, or availability of any of the Transport Services or that they will be error free. 6. I fully understand that traveling by vehicle involves risks and dangers of serious bodily injury, including permanent disability, paralysis, and death. I agree, on behalf of myself and on behalf of any minor child using the Transport Services for whom I am the parent or legal guardian, that the entire risk arising out of my use of the [employer] remains solely with me, to the maximum extent permitted under applicable law. 7. The [employer]  are provided "as is" and "as available." [facility] disclaims all representations and warranties, express, implied or statutory, not expressly set out in these terms, including the implied warranties of merchantability and fitness for a particular purpose. 8. I hereby waive and release [facility], its agents, employees, officers, directors, representatives, insurers, attorneys, assigns, successors, subsidiaries, and affiliates from any and all past, present, or future claims, demands, liabilities, actions, causes of action, or suits of any kind directly or indirectly arising from acceptance and use of the [employer]. 9. I further waive and release [facility] and its affiliates from all present and future liability and responsibility for any injury or death to persons or damages to property caused by or related to the use of the [employer]. 10. I have read this Waiver and Release of Liability, and I understand the terms used in it and their legal significance. This Waiver is freely and voluntarily given with the understanding that my right (as well as the right of any minor child for whom I am the parent or legal guardian using the [employer]) to legal recourse against  in connection with the [employer] is knowingly surrendered in return for use of these services.   I attest that I read the consent document to Donna [name], gave Ms. Snellgrove the opportunity to ask questions and answered the questions asked (if any). I affirm that Donna [name] then provided consent for she's participation in this program.     Darrick Meigs Vilsaint

## 2020-02-16 ENCOUNTER — Ambulatory Visit
Admission: RE | Admit: 2020-02-16 | Discharge: 2020-02-16 | Disposition: A | Discharge status: Home / Self Care | Payer: Self-pay | Source: Ambulatory Visit | Attending: Obstetrics and Gynecology | Admitting: Obstetrics and Gynecology

## 2020-02-16 ENCOUNTER — Other Ambulatory Visit: Payer: Self-pay

## 2020-02-16 ENCOUNTER — Other Ambulatory Visit: Payer: Self-pay | Admitting: Obstetrics and Gynecology

## 2020-02-16 ENCOUNTER — Ambulatory Visit: Payer: Self-pay | Admitting: *Deleted

## 2020-02-16 VITALS — BP 130/86 | Wt 320.4 lb

## 2020-02-16 DIAGNOSIS — N6310 Unspecified lump in the right breast, unspecified quadrant: Secondary | ICD-10-CM

## 2020-02-16 DIAGNOSIS — N644 Mastodynia: Secondary | ICD-10-CM

## 2020-02-16 DIAGNOSIS — Z1239 Encounter for other screening for malignant neoplasm of breast: Secondary | ICD-10-CM

## 2020-02-16 NOTE — Progress Notes (Signed)
Ms. Donna [name] is a 54 y.o. female who presents to [facility] clinic today with complaint of right breast swelling x 3 months that feels heavy like when your breast get full breastfeeding. Patient stated she she feel three weeks ago and noticed a right breast lump after the fall. Patient stated she hasn't felt the lump in three days. Patient complained of right breast pain when lays on her breast.    Pap Smear: Pap smear not completed today. Last Pap smear was 08/17/2015 at [facility] for [place] [facility] and was normal with positive HPV. Per patient has history of an abnormal Pap smear 15-20 years ago that showed pre-cancerous cells that she had no treatment for follow-up. Last Pap smear result is available in Epic.   Physical exam: Breasts Breasts symmetrical. No skin abnormalities bilateral breasts. No nipple retraction bilateral breasts. No nipple discharge bilateral breasts. No lymphadenopathy. No lumps palpated bilateral breasts. Unable to palpate a lump in patients area of concern within the right breast. Complaints of bilateral diffuse breast pain on exam that was greater within the right inner breast.       Pelvic/Bimanual Patient refused Pap smear today and stated she will call to reschedule.    Smoking History: Patient has never smoked.   Patient Navigation: Patient education provided. Access to services provided for patient through [facility] program. Transportation provided to [employer] and [employer] appointments and home.   Colorectal Cancer Screening: Per patient has never had colonoscopy completed. No complaints today.    Breast and Cervical Cancer Risk Assessment: Patient has family history of her mother having breast cancer. Patient has no known genetic mutations or history of radiation treatment to the chest before age 13. Patient does not have history of cervical dysplasia, immunocompromised, or DES exposure in-utero.  Risk Assessment    Risk Scores      02/16/2020    Last edited by: Demetrius Revel, LPN   5-year risk: 2 %   Lifetime risk: 15.9 %          A: [facility] exam without pap smear Complaint of right breast swelling, pain, and lump.  P: Referred patient to the [facility] for a diagnostic mammogram. Appointment scheduled Thursday, February 16, 2020 at 0910.  Loletta Parish, RN 02/16/2020 8:27 AM

## 2020-02-16 NOTE — Patient Instructions (Signed)
Explained breast self awareness with Ezequiel Kayser. Patient refused Pap smear today. Patient stated she will call to schedule her Pap smear. Let her know [facility] will cover Pap smears every 3 years unless has a history of abnormal Pap smears and she is past due for her Pap smear. Referred patient to the [facility] for a diagnostic mammogram. Appointment scheduled Thursday, February 16, 2020 at 0910. Patient aware of appointment and will be there. Ezequiel Kayser verbalized understanding.  Tishana Clinkenbeard, Arvil Chaco, RN 8:28 AM

## 2020-08-03 ENCOUNTER — Other Ambulatory Visit: Payer: Self-pay

## 2020-08-03 ENCOUNTER — Emergency Department (HOSPITAL_BASED_OUTPATIENT_CLINIC_OR_DEPARTMENT_OTHER): Payer: Self-pay | Admitting: Radiology

## 2020-08-03 ENCOUNTER — Emergency Department (HOSPITAL_BASED_OUTPATIENT_CLINIC_OR_DEPARTMENT_OTHER): Payer: Self-pay

## 2020-08-03 ENCOUNTER — Encounter (HOSPITAL_BASED_OUTPATIENT_CLINIC_OR_DEPARTMENT_OTHER): Payer: Self-pay | Admitting: *Deleted

## 2020-08-03 ENCOUNTER — Emergency Department (HOSPITAL_BASED_OUTPATIENT_CLINIC_OR_DEPARTMENT_OTHER)
Admission: EM | Admit: 2020-08-03 | Discharge: 2020-08-03 | Disposition: A | Discharge status: Home / Self Care | Payer: Self-pay | Attending: Emergency Medicine | Admitting: Emergency Medicine

## 2020-08-03 DIAGNOSIS — I10 Essential (primary) hypertension: Secondary | ICD-10-CM | POA: Insufficient documentation

## 2020-08-03 DIAGNOSIS — M25512 Pain in left shoulder: Secondary | ICD-10-CM | POA: Insufficient documentation

## 2020-08-03 DIAGNOSIS — W010XXA Fall on same level from slipping, tripping and stumbling without subsequent striking against object, initial encounter: Secondary | ICD-10-CM | POA: Insufficient documentation

## 2020-08-03 DIAGNOSIS — Y9389 Activity, other specified: Secondary | ICD-10-CM | POA: Insufficient documentation

## 2020-08-03 DIAGNOSIS — S83421A Sprain of lateral collateral ligament of right knee, initial encounter: Secondary | ICD-10-CM

## 2020-08-03 DIAGNOSIS — S82831A Other fracture of upper and lower end of right fibula, initial encounter for closed fracture: Secondary | ICD-10-CM | POA: Insufficient documentation

## 2020-08-03 DIAGNOSIS — Y9289 Other specified places as the place of occurrence of the external cause: Secondary | ICD-10-CM | POA: Insufficient documentation

## 2020-08-03 DIAGNOSIS — W19XXXA Unspecified fall, initial encounter: Secondary | ICD-10-CM

## 2020-08-03 HISTORY — DX: Post-traumatic stress disorder, unspecified: F43.10

## 2020-08-03 HISTORY — DX: Unspecified urinary incontinence: R32

## 2020-08-03 HISTORY — DX: Panic disorder (episodic paroxysmal anxiety): F41.0

## 2020-08-03 MED ORDER — FENTANYL CITRATE (PF) 100 MCG/2ML IJ SOLN
50.0000 ug | Freq: Once | INTRAMUSCULAR | Status: AC
  Administered 2020-08-03: 50 ug via RESPIRATORY_TRACT
  Filled 2020-08-03: qty 2

## 2020-08-03 MED ORDER — HYDROMORPHONE HCL 2 MG PO TABS: 1.0000 mg | ORAL_TABLET | Freq: Four times a day (QID) | ORAL | 0 refills | Status: DC | PRN

## 2020-08-03 NOTE — ED Notes (Addendum)
Patient states has friend who can transport her.  States decided not to take EMS.  Left by POV.

## 2020-08-03 NOTE — ED Notes (Signed)
Patient awaiting EMS for transport home

## 2020-08-03 NOTE — ED Triage Notes (Addendum)
Patient fell yesterday while mopping her kitchen floor.  Right leg is very painful and swollen and was not able to walk after the fall.  She also c/o pain to her left shoulder.

## 2020-08-03 NOTE — ED Provider Notes (Signed)
[facility] DEPT Provider Note   CSN: [id number] Arrival date & time: 08/03/20  1333     History Chief Complaint  Patient presents with   Leg Injury    Donna [name] is a 55 y.o. female.  HPI 55 year old female presents today complaining of right lower extremity pain after fall yesterday.  States that she tripped on water and fell with her right leg being flexed.  She is also complaining of left shoulder pain.  She denies loss of consciousness.  She states she was able to get up with the assistance of her son and he helped her to the bed.  She presents today via EMS for pain and decreased mobility secondary to this.    Past Medical History:  Diagnosis Date   Anxiety    Bladder incontinence    Chronic back pain    Fibromyalgia    Panic attack    PTSD (post-traumatic stress disorder)     Patient Active Problem List   Diagnosis Date Noted   Anxiety associated with depression 06/13/2019   Chronic pain syndrome 06/13/2019   Essential hypertension 06/13/2019   Diverticulitis 07/11/2016   Cholelithiasis 07/11/2016   Hepatomegaly 07/11/2016   Esophageal reflux    Chronic migraine without aura without status migrainosus, not intractable    Morbid obesity due to excess calories (HCC)    Symptomatic anemia 06/14/2015   Menorrhagia 06/14/2015    Past Surgical History:  Procedure Laterality Date   APPENDECTOMY     BACK SURGERY     lumbar  fusions     PERINEAL WOUND CLOSURE       OB History     Gravida  3   Para  2   Term  2   Preterm  0   AB  1   Living  2      SAB  1   IAB  0   Ectopic  0   Multiple  0   Live Births  2           Family History  Problem Relation Age of Onset   Cancer Mother    Heart disease Mother    Stroke Mother    Hypertension Mother    Breast cancer Mother    Heart disease Father     Social History   Tobacco Use   Smoking status: Never   Smokeless tobacco: Never  Vaping Use   Vaping Use:  Never used  Substance Use Topics   Alcohol use: No   Drug use: No    Home Medications Prior to Admission medications   Medication Sig Start Date End Date Taking? Authorizing Provider  buPROPion (WELLBUTRIN SR) 100 MG 12 hr tablet Take 1 tablet (100 mg total) by mouth 2 (two) times daily. Patient not taking: Reported on 12/09/2019 06/13/19 01/16/20  Ladona Horns, MD    Allergies    Advil [ibuprofen], Celebrex [celecoxib], and Oxycontin [oxycodone hcl]  Review of Systems   Review of Systems  All other systems reviewed and are negative.  Physical Exam Updated Vital Signs Ht 1.727 m (5\' 8" )   Wt (!) 142.9 kg   LMP 07/27/2020   BMI 47.90 kg/m   Physical Exam Vitals reviewed.  Constitutional:      Appearance: Normal appearance. She is not ill-appearing.  HENT:     Head: Normocephalic.     Right Ear: External ear normal.     Left Ear: External ear normal.     Nose:  Nose normal.     Mouth/Throat:     Mouth: Mucous membranes are moist.  Eyes:     Pupils: Pupils are equal, round, and reactive to light.  Cardiovascular:     Rate and Rhythm: Normal rate and regular rhythm.  Pulmonary:     Effort: Pulmonary effort is normal.  Abdominal:     General: Bowel sounds are normal.     Palpations: Abdomen is soft.  Musculoskeletal:     Cervical back: Normal range of motion and neck supple.     Comments: Diffuse right lower extremity pain with tenderness with palpation from hip through to toe. There does appear to be some diffuse swelling of the right lower extremity. Pulses are intact No obvious deformity or contusions or external signs of trauma or noted Back examined and no external signs of trauma noted Left shoulder has full active range of motion she complains of pain with lateral extension and palpation.   Skin:    General: Skin is warm.     Capillary Refill: Capillary refill takes less than 2 seconds.  Neurological:     General: No focal deficit present.     Mental  Status: She is alert.    ED Results / Procedures / Treatments   Labs (all labs ordered are listed, but only abnormal results are displayed) Labs Reviewed - No data to display  EKG None  Radiology No results found.  Procedures Procedures   Medications Ordered in ED Medications - No data to display  ED Course  I have reviewed the triage vital signs and the nursing notes.  Pertinent labs & imaging results that were available during my care of the patient were reviewed by me and considered in my medical decision making (see chart for details).    MDM Rules/Calculators/A&P                          55 year old female presents today with pain in her right leg after fall yesterday.  She has some swelling and tenderness around the left knee and has diffuse tenderness of the left lower extremity but appears to be neurovascularly intact. Awaiting radiology interpretation of studies  x-rays of the right hip, left knee, left ankle reveal a fracture of the left fibular head on my interpretation Discussed care with Dr. Joya Gaskins.  She has assumed care of patient in signout.  Final Clinical Impression(s) / ED Diagnoses Final diagnoses:  Fall    Rx / DC Orders ED Discharge Orders     None        Pattricia Boss, MD 08/03/20 1530

## 2020-08-03 NOTE — ED Provider Notes (Signed)
  Physical Exam  Ht 5\' 8"  (1.727 m)   Wt (!) 142.9 kg   LMP 07/27/2020   BMI 47.90 kg/m   Physical Exam  ED Course/Procedures     Procedures  MDM    Donna [name] sustained a proximal fibula fracture with a likely associated lateral collateral ligament tear.  She will be placed in a knee immobilizer and given crutches.  I have asked her to make an appointment with orthopedics.  Unfortunately, she does not have health insurance.  Therefore, I have placed a referral to transitions of care.  She did ask for pain medication, and I told her she could have a small supply of pain medication.  We talked about the appropriate use of narcotics, and she was advised on side effects.   Arnaldo Natal, MD 08/03/20 [phone]

## 2020-08-03 NOTE — ED Notes (Signed)
Patient transported to X-ray 

## 2020-08-09 ENCOUNTER — Telehealth: Payer: Self-pay | Admitting: *Deleted

## 2020-08-28 ENCOUNTER — Encounter: Payer: Self-pay | Admitting: *Deleted

## 2020-12-03 ENCOUNTER — Encounter: Payer: Self-pay | Admitting: Internal Medicine

## 2021-02-11 ENCOUNTER — Encounter: Payer: Self-pay | Admitting: Internal Medicine

## 2021-02-11 ENCOUNTER — Ambulatory Visit (INDEPENDENT_AMBULATORY_CARE_PROVIDER_SITE_OTHER): Payer: Self-pay | Admitting: Internal Medicine

## 2021-02-11 DIAGNOSIS — J069 Acute upper respiratory infection, unspecified: Secondary | ICD-10-CM | POA: Insufficient documentation

## 2021-02-11 MED ORDER — GUAIFENESIN-DM 100-10 MG/5ML PO SYRP: 5.0000 mL | ORAL_SOLUTION | ORAL | 0 refills | Status: DC | PRN

## 2021-02-11 MED ORDER — FLUTICASONE PROPIONATE 50 MCG/ACT NA SUSP: 1.0000 | Freq: Every day | NASAL | 0 refills | Status: DC

## 2021-02-11 NOTE — Assessment & Plan Note (Signed)
Patient is evaluated via telephone visit for cough ,congestion, and sinus pain for 2 days. She also feels pain and fullness in left ear today and some loose bowel movements. Her son was sick 4 days before her and his symptoms lasted for 5 days total. He was not seen by a doctor or tested for viral infections. She drank after him on accident. Taking tylenol and OTC sudafed. Patient has not received vaccinations for Influenza or Covid.   Assessment/Plan: URI, expect viral etiology. Recommended home testing for Covid to help determine if she would need to quarantine. Will send Robitussin DM for cough and Flonase for congestion. If patient does not improve she will call to schedule in person evaluation.

## 2021-02-11 NOTE — Progress Notes (Signed)
°  [facility] Internal Medicine Residency Telephone Encounter Continuity Care Appointment  HPI:  This telephone encounter was created for Ms. Donna [name] on 02/11/2021 for the following purpose/cc cough ,congestion, and sinus pain for 2 days.    Past Medical History:  Past Medical History:  Diagnosis Date   Anxiety    Bladder incontinence    Chronic back pain    Fibromyalgia    Panic attack    PTSD (post-traumatic stress disorder)      ROS:  Review of Systems  HENT:  Positive for sinus pain and sore throat.   Musculoskeletal:  Positive for myalgias.    Assessment / Plan / Recommendations:  Please see A&P under problem oriented charting for assessment of the patient's acute and chronic medical conditions.  As always, pt is advised that if symptoms worsen or new symptoms arise, they should go to an urgent care facility or to to ER for further evaluation.   Consent and Medical Decision Making:  Patient discussed with Dr. Philipp Ovens This is a telephone encounter between Donna [name] and Lorene Dy on 02/11/2021 for Upper Respiratory Infection. The visit was conducted with the patient located at home and Lorene Dy at [facility]. The patient's identity was confirmed using their DOB and current address. The patient has consented to being evaluated through a telephone encounter and understands the associated risks (an examination cannot be done and the patient may need to come in for an appointment) / benefits (allows the patient to remain at home, decreasing exposure to coronavirus). I personally spent 15 minutes on medical discussion.

## 2021-02-11 NOTE — Progress Notes (Signed)
Internal Medicine Clinic Attending  Case discussed with Dr. Steen  At the time of the visit.  We reviewed the resident's history and exam and pertinent patient test results.  I agree with the assessment, diagnosis, and plan of care documented in the resident's note.  

## 2021-02-13 ENCOUNTER — Telehealth: Payer: Self-pay | Admitting: *Deleted

## 2021-02-13 NOTE — Telephone Encounter (Signed)
Patient calling back to let Dr. Court Joy know that she and her son are both positive for Covid and she does not feel any better since tele appt on 12/19.

## 2021-02-23 NOTE — Progress Notes (Deleted)
° °  Office Visit   Patient ID: Donna [name], female    DOB: 1965-11-25, 55 y.o.   MRN: [id number]   PCP: Mitzi Hansen, MD   Subjective:  CC: No chief complaint on file.   Donna [name] is a 55 y.o. year old female who presents for follow up of the above chronic conditions. Please refer to problem based charting for assessment and plan.   ACTIVE MEDICATIONS   Outpatient Medications Prior to Visit  Medication Sig   fluticasone (FLONASE) 50 MCG/ACT nasal spray Place 1 spray into both nostrils daily.   [DISCONTINUED] guaiFENesin-dextromethorphan (ROBITUSSIN DM) 100-10 MG/5ML syrup Take 5 mLs by mouth every 4 (four) hours as needed for cough.   [DISCONTINUED] HYDROmorphone (DILAUDID) 2 MG tablet Take 0.5 tablets (1 mg total) by mouth every 6 (six) hours as needed for up to 12 doses for severe pain.   No facility-administered medications prior to visit.     Objective:   There were no vitals taken for this visit. Wt Readings from Last 3 Encounters:  08/03/20 (!) 315 lb (142.9 kg)  02/16/20 (!) 320 lb 6.4 oz (145.3 kg)  12/08/19 (!) 321 lb 14 oz (146 kg)    BP Readings from Last 3 Encounters:  08/03/20 138/74  02/16/20 130/86  01/16/20 (!) 112/59     Health Maintenance:   Health Maintenance  Topic Date Due   COVID-19 Vaccine (1) Never done   HIV Screening  Never done   Hepatitis C Screening  Never done   TETANUS/TDAP  Never done   COLONOSCOPY (Pts 45-83yrs Insurance coverage will need to be confirmed)  Never done   Zoster Vaccines- Shingrix (1 of 2) Never done   PAP SMEAR-Modifier  08/17/2018   INFLUENZA VACCINE  09/24/2020   MAMMOGRAM  02/15/2022   Pneumococcal Vaccine 67-21 Years old  Aged Out   HPV VACCINES  Aged Out    Assessment & Plan:   Problem List Items Addressed This Visit   None    No follow-ups on file.   Pt discussed with ***  Mitzi Hansen, MD Internal Medicine Resident PGY-3 [facility] 02/23/2021 10:58 AM

## 2021-02-26 ENCOUNTER — Encounter: Payer: Self-pay | Admitting: Internal Medicine

## 2021-03-12 ENCOUNTER — Encounter: Payer: Self-pay | Admitting: Internal Medicine

## 2021-05-14 ENCOUNTER — Other Ambulatory Visit: Payer: Self-pay

## 2021-05-14 DIAGNOSIS — Z1231 Encounter for screening mammogram for malignant neoplasm of breast: Secondary | ICD-10-CM

## 2021-07-18 ENCOUNTER — Ambulatory Visit: Payer: Self-pay | Admitting: *Deleted

## 2021-07-18 ENCOUNTER — Ambulatory Visit
Admission: RE | Admit: 2021-07-18 | Discharge: 2021-07-18 | Disposition: A | Discharge status: Home / Self Care | Payer: No Typology Code available for payment source | Source: Ambulatory Visit | Attending: Obstetrics and Gynecology | Admitting: Obstetrics and Gynecology

## 2021-07-18 ENCOUNTER — Ambulatory Visit: Payer: Self-pay

## 2021-07-18 VITALS — BP 160/98 | Wt 321.0 lb

## 2021-07-18 DIAGNOSIS — Z1231 Encounter for screening mammogram for malignant neoplasm of breast: Secondary | ICD-10-CM

## 2021-07-18 DIAGNOSIS — Z1211 Encounter for screening for malignant neoplasm of colon: Secondary | ICD-10-CM

## 2021-07-18 DIAGNOSIS — Z01419 Encounter for gynecological examination (general) (routine) without abnormal findings: Secondary | ICD-10-CM

## 2021-07-18 NOTE — Progress Notes (Signed)
Donna [name] is a 56 y.o. [id number] female who presents to [facility] clinic today with complaint of right breast pain when lays on her breast and heaviness consistent with previous exam 02/16/2020. Patient rated the pain at a 8 out of 10. Patient had diagnostic follow up after previous exam 02/16/2020 that was benign.   Pap Smear: Pap smear not completed today. Last Pap smear was 08/17/2015 at [facility] and was normal with positive HPV. Per patient has history of an abnormal Pap smear 17-22 years ago that showed pre-cancerous cells that she had no treatment for follow-up. Last Pap smear result is available in Epic.   Physical exam: Breasts Breasts symmetrical. No skin abnormalities bilateral breasts. No nipple retraction bilateral breasts. No nipple discharge bilateral breasts. No lymphadenopathy. No lumps palpated bilateral breasts. Complaints of bilateral outer breast pain on exam.     MS DIGITAL DIAG TOMO BILAT  Result Date: 02/16/2020 CLINICAL DATA:  56 year old female presenting for a possible lump in the right breast which the patient can no longer feel. History of breast cancer in the patient's mother. EXAM: DIGITAL DIAGNOSTIC BILATERAL MAMMOGRAM WITH TOMO ULTRASOUND LEFT BREAST COMPARISON:  None. ACR Breast Density Category c: The breast tissue is heterogeneously dense, which may obscure small masses. FINDINGS: Mammogram: Right breast: No suspicious mass, distortion, or microcalcifications are identified to suggest presence of malignancy. Left breast: A spot compression tomosynthesis view and full field mL tomosynthesis view of the left breast were performed in addition to standard views for an asymmetry in the inferior central left breast. On the additional imaging and elliptical asymmetry persists measuring approximately 1.9 cm. Ultrasound: Targeted ultrasound is performed in the left breast at 5 o'clock 5 cm from the nipple demonstrating fibrocystic changes  measuring 1.7 x 0.6 x 1.1 cm. No internal vascularity. No suspicious solid mass in the inferior breast. IMPRESSION: 1.  No mammographic evidence of malignancy in the right breast. 2.  Benign fibrocystic changes in the inferior left breast. RECOMMENDATION: Screening mammogram in one year.(Code:SM-B-01Y) I have discussed the findings and recommendations with the patient. If applicable, a reminder letter will be sent to the patient regarding the next appointment. BI-RADS CATEGORY  2: Benign. Electronically Signed   By: Audie Pinto M.D.   On: 02/16/2020 10:50     Pelvic/Bimanual Ext Genitalia No lesions, no swelling and no discharge observed on external genitalia.        Vagina Vagina pink and normal texture. No lesions or discharge observed in vagina.        Cervix Cervix is present. Cervix pink and of normal texture. No discharge observed.    Uterus Uterus is present and palpable. Uterus in normal position and normal size.        Adnexae Bilateral ovaries present and palpable. No tenderness on palpation.         Rectovaginal No rectal exam completed today since patient had no rectal complaints. No skin abnormalities observed on exam.     Smoking History: Patient has never smoked.   Patient Navigation: Patient education provided. Access to services provided for patient through [facility] program.   Colorectal Cancer Screening: Per patient has never had colonoscopy completed. FIT Test given to patient to complete. No complaints today.    Breast and Cervical Cancer Risk Assessment: Patient has family history of her mother having breast cancer. Patient has no known genetic mutations or history of radiation treatment to the chest before age 45. Patient does not  have history of cervical dysplasia, immunocompromised, or DES exposure in-utero.  Risk Assessment     Risk Scores       07/18/2021 02/16/2020   Last edited by: Royston Bake, CMA [place], Sherie Mamie Nick, LPN   5-year risk: 2.1 %  2 %   Lifetime risk: 15.7 % 15.9 %            A: [facility] exam with pap smear Complaint of right breast pain.  P: Referred patient to the [facility] for a screening mammogram on mobile unit. Appointment scheduled Thursday, Jul 18, 2021 at 1440.  Loletta Parish, RN 07/18/2021 2:15 PM

## 2021-07-18 NOTE — Patient Instructions (Addendum)
Explained breast self awareness with Donna [name]. Pap smear completed today. Let her know that her next Pap smear will be based on the result of today's Pap smear due to her previous Pap smear was HPV positive. Referred patient to the [facility] for a screening mammogram on mobile unit. Appointment scheduled Thursday, Jul 18, 2021 at 1440. Patient aware of appointment and will be there. Let patient know will follow up with her within the next couple weeks with results of Pap smear by phone. Informed patient that the [facility] will follow up with her within the next couple of weeks with results of her mammogram by letter or phone. Donna [name] verbalized understanding.  Sharvil Hoey, Arvil Chaco, RN 2:16 PM

## 2021-07-24 ENCOUNTER — Telehealth: Payer: Self-pay

## 2021-07-24 LAB — CYTOLOGY - PAP
Comment: NEGATIVE
Comment: NEGATIVE
Comment: NEGATIVE
Diagnosis: HIGH — AB
HPV 16: NEGATIVE
HPV 18 / 45: POSITIVE — AB
High risk HPV: POSITIVE — AB

## 2021-07-24 NOTE — Telephone Encounter (Signed)
Patient informed pap results, ASC-US (H), and positive for HPV 18/45, recommendation is to do a colposcopy. Patient informed if colposcopy results require further treatment, we will then complete a medicaid application. Patient verbalized understanding. Referral information sent to [facility].

## 2021-07-24 NOTE — Progress Notes (Signed)
        Needs colpo

## 2021-08-06 ENCOUNTER — Other Ambulatory Visit: Payer: Self-pay | Admitting: Internal Medicine

## 2021-08-06 DIAGNOSIS — Z1231 Encounter for screening mammogram for malignant neoplasm of breast: Secondary | ICD-10-CM

## 2021-08-12 ENCOUNTER — Telehealth: Payer: Self-pay

## 2021-08-12 NOTE — Telephone Encounter (Signed)
Left message for patient about re-scheduling cancelled mammo appointment. Left name and number for patient to call back.

## 2021-08-12 NOTE — Telephone Encounter (Signed)
Opened in error

## 2021-08-19 ENCOUNTER — Encounter: Payer: Self-pay | Admitting: *Deleted

## 2021-08-20 ENCOUNTER — Telehealth: Payer: Self-pay

## 2021-08-24 DIAGNOSIS — N871 Moderate cervical dysplasia: Secondary | ICD-10-CM

## 2021-08-24 HISTORY — DX: Moderate cervical dysplasia: N87.1

## 2021-08-29 ENCOUNTER — Ambulatory Visit
Admission: RE | Admit: 2021-08-29 | Discharge: 2021-08-29 | Disposition: A | Discharge status: Home / Self Care | Payer: No Typology Code available for payment source | Source: Ambulatory Visit | Attending: Obstetrics and Gynecology | Admitting: Obstetrics and Gynecology

## 2021-08-29 DIAGNOSIS — Z1231 Encounter for screening mammogram for malignant neoplasm of breast: Secondary | ICD-10-CM

## 2021-09-09 ENCOUNTER — Encounter: Payer: Self-pay | Admitting: Obstetrics & Gynecology

## 2021-09-09 ENCOUNTER — Other Ambulatory Visit (HOSPITAL_COMMUNITY)
Admission: RE | Admit: 2021-09-09 | Discharge: 2021-09-09 | Disposition: A | Discharge status: Home / Self Care | Payer: Self-pay | Source: Ambulatory Visit | Attending: Obstetrics & Gynecology | Admitting: Obstetrics & Gynecology

## 2021-09-09 ENCOUNTER — Ambulatory Visit (INDEPENDENT_AMBULATORY_CARE_PROVIDER_SITE_OTHER): Payer: No Typology Code available for payment source | Admitting: Obstetrics & Gynecology

## 2021-09-09 ENCOUNTER — Other Ambulatory Visit: Payer: Self-pay

## 2021-09-09 VITALS — BP 163/93 | HR 80 | Wt 327.0 lb

## 2021-09-09 DIAGNOSIS — R87611 Atypical squamous cells cannot exclude high grade squamous intraepithelial lesion on cytologic smear of cervix (ASC-H): Secondary | ICD-10-CM | POA: Insufficient documentation

## 2021-09-09 DIAGNOSIS — Z3202 Encounter for pregnancy test, result negative: Secondary | ICD-10-CM

## 2021-09-09 LAB — POCT PREGNANCY, URINE: Preg Test, Ur: NEGATIVE

## 2021-09-09 NOTE — Progress Notes (Unsigned)
Patient ID: Donna [name], female   DOB: 1965/09/07, 56 y.o.   MRN: [id number]  Chief Complaint  Patient presents with   Procedure    HPI Donna [name] is a 56 y.o. female.  [id number] No LMP recorded. (Menstrual status: Irregular Periods). She has irregular menses HPI  Indications: Pap smear on May 2023 showed: ASC cannot exclude high grade lesion [facility]). Previous colposcopy: . Prior cervical treatment: no treatment.  Past Medical History:  Diagnosis Date   Anxiety    Bladder incontinence    Chronic back pain    Fibromyalgia    Panic attack    PTSD (post-traumatic stress disorder)     Past Surgical History:  Procedure Laterality Date   APPENDECTOMY     BACK SURGERY     lumbar  fusions     PERINEAL WOUND CLOSURE      Family History  Problem Relation Age of Onset   Cancer Mother    Heart disease Mother    Stroke Mother    Hypertension Mother    Breast cancer Mother    Heart disease Father     Social History Social History   Tobacco Use   Smoking status: Never   Smokeless tobacco: Never  Vaping Use   Vaping Use: Never used  Substance Use Topics   Alcohol use: No   Drug use: No    Allergies  Allergen Reactions   Advil [Ibuprofen] Swelling   Celebrex [Celecoxib] Other (See Comments)    Facial edema    Oxycontin [Oxycodone Hcl] Other (See Comments)    Hallucinations.  Has taken morphine and other pain meds and is fine.    Current Outpatient Medications  Medication Sig Dispense Refill   fluticasone (FLONASE) 50 MCG/ACT nasal spray Place 1 spray into both nostrils daily. (Patient not taking: Reported on 07/18/2021) 11.1 mL 0   No current facility-administered medications for this visit.    Review of Systems Review of Systems  Blood pressure (!) 163/93, pulse 80, weight (!) 327 lb (148.3 kg).  Physical Exam Physical Exam  Data Reviewed Pap result  Assessment    Procedure Details  The risks and benefits of the procedure and Written informed consent  obtained.  Speculum placed in vagina and excellent visualization of cervix achieved, cervix swabbed x 3 with acetic acid solution. Cx erythematous but no discrete lesion, difficult due to patient's discomfort and anteverted cervix, body habitus Specimens: ECC, Bx  9628  Complications: none.     Plan    Specimens labelled and sent to Pathology.       Donna [name] 09/09/2021, 2:37 PM

## 2021-09-11 LAB — SURGICAL PATHOLOGY

## 2021-09-12 NOTE — Progress Notes (Signed)
HSIL CIN 2. Patient needs LEEP

## 2021-09-17 ENCOUNTER — Telehealth: Payer: Self-pay | Admitting: Emergency Medicine

## 2021-09-17 NOTE — Telephone Encounter (Signed)
RC from patient to discuss results and need to schedule leep. Pt transferred to front desk to schedule leep procedure.

## 2021-09-17 NOTE — Progress Notes (Signed)
TC to discuss results and need for LEEP. No answer. HIPPA compliant VM left. Has MyChart, not active since 2021. Message sent via [facility], but pt strongly encouraged to return call to office to discuss results and follow up.

## 2021-10-02 ENCOUNTER — Telehealth: Payer: Self-pay

## 2021-10-02 NOTE — Telephone Encounter (Signed)
Attempted to contact patient regarding completing [facility] Medicaid application(needs LEEP procedure). Left message on voicemail requesting a return call.

## 2021-11-25 ENCOUNTER — Telehealth: Payer: Self-pay

## 2021-11-25 NOTE — Telephone Encounter (Signed)
Returned patient's call, regarding [facility] Medicaid application. Left message on voicemail requesting a return call.

## 2021-11-26 ENCOUNTER — Telehealth: Payer: Self-pay | Admitting: Family Medicine

## 2021-11-26 NOTE — Telephone Encounter (Signed)
Called patient, no answer- left message stating we are trying to reach her to return her phone call regarding her questions/concerns. Please call us back

## 2021-11-26 NOTE — Telephone Encounter (Signed)
Patient called in to cancel her leep procedure because she had the wrong day but she also stated she is having severe anxiety about this procedure and wanted to learn more about being put to sleep for the procedure.

## 2021-11-27 ENCOUNTER — Ambulatory Visit: Payer: Self-pay | Admitting: Obstetrics & Gynecology

## 2021-11-29 NOTE — Telephone Encounter (Signed)
Called pt; VM left encouraging pt to call back to discuss concerns.

## 2021-12-09 ENCOUNTER — Telehealth: Payer: Self-pay | Admitting: Family Medicine

## 2021-12-09 NOTE — Telephone Encounter (Signed)
Patient came to the front office wanting to discuss her leep procedure and the possibility of being put to sleep for it. She is requesting a call form a nurse to discuss this and voicemail's are okay to be left on her number.

## 2021-12-12 NOTE — Telephone Encounter (Signed)
Reviewed with Kennon Rounds MD who recommends consult visit to discuss further. VM left for patient stating provider's recommendation. [place] office notified to schedule.

## 2021-12-26 ENCOUNTER — Ambulatory Visit (INDEPENDENT_AMBULATORY_CARE_PROVIDER_SITE_OTHER): Payer: Self-pay | Admitting: Obstetrics & Gynecology

## 2021-12-26 ENCOUNTER — Encounter: Payer: Self-pay | Admitting: Obstetrics & Gynecology

## 2021-12-26 ENCOUNTER — Encounter: Payer: Self-pay | Admitting: Family Medicine

## 2021-12-26 VITALS — BP 142/75 | HR 89 | Wt 327.1 lb

## 2021-12-26 DIAGNOSIS — F419 Anxiety disorder, unspecified: Secondary | ICD-10-CM

## 2021-12-26 DIAGNOSIS — N871 Moderate cervical dysplasia: Secondary | ICD-10-CM

## 2021-12-26 MED ORDER — CLONAZEPAM 1 MG PO TABS: 1.0000 mg | ORAL_TABLET | Freq: Two times a day (BID) | ORAL | 0 refills | Status: AC

## 2021-12-26 NOTE — Progress Notes (Signed)
   GYNECOLOGY OFFICE VISIT NOTE  History:   Donna [name] is a 56 y.o. [id number] here today for discussion about recommended LEEP for recently diagnosed CIN2. She reports having increased anxiety around procedures, asking for anxiety medication prior to LEEP.   She denies any abnormal vaginal discharge, bleeding, pelvic pain or other concerns.    Past Medical History:  Diagnosis Date   Anxiety    Bladder incontinence    Chronic back pain    Fibromyalgia    Panic attack    PTSD (post-traumatic stress disorder)    Vaginal Pap smear, abnormal     Past Surgical History:  Procedure Laterality Date   APPENDECTOMY     BACK SURGERY     lumbar  fusions     PERINEAL WOUND CLOSURE      The following portions of the patient's history were reviewed and updated as appropriate: allergies, current medications, past family history, past medical history, past social history, past surgical history and problem list.   Review of Systems:  Pertinent items noted in HPI and remainder of comprehensive ROS otherwise negative.  Physical Exam:  BP (!) 142/75   Pulse 89   Wt (!) 327 lb 1.6 oz (148.4 kg)   LMP 12/16/2021 (Exact Date)   BMI 49.74 kg/m  CONSTITUTIONAL: Well-developed, well-nourished female in no acute distress.  HEENT:  Normocephalic, atraumatic. External right and left ear normal. No scleral icterus.  NECK: Normal range of motion, supple, no masses noted on observation SKIN: No rash noted. Not diaphoretic. No erythema. No pallor. MUSCULOSKELETAL: Normal range of motion. No edema noted. NEUROLOGIC: Alert and oriented to person, place, and time. Normal muscle tone coordination. No cranial nerve deficit noted. PSYCHIATRIC: Normal mood and affect. Normal behavior. Normal judgment and thought content. CARDIOVASCULAR: Normal heart rate noted RESPIRATORY: Effort and breath sounds normal, no problems with respiration noted ABDOMEN: No masses noted. No other overt distention noted.   PELVIC:  Deferred    Assessment and Plan:     1. Anxiety due to invasive procedure 2. Dysplasia of cervix, high grade CIN 2 LEEP details discussed with patient, all questions answered. She desired Klonopin for anxiety around procedure, this was prescribed.  She filled out Medicaid paperwork to help pay for this procedure, she may decide later that she wants to do this in the OR instead given her anxiety.  She wants this to be done by Dr. Kennon Rounds. - clonazePAM (KLONOPIN) 1 MG tablet; Take 1 tablet (1 mg total) by mouth 2 (two) times daily.  Dispense: 5 tablet; Refill: 0 Please refer to After Visit Summary for other counseling recommendations.   Return for LEEP with Dr. Kennon Rounds as soon as possible.    I spent 20 minutes dedicated to the care of this patient including pre-visit review of records, face to face time with the patient discussing her conditions and treatments and post visit orders.    Verita Schneiders, MD, [place] for [employer], [place]

## 2022-01-15 ENCOUNTER — Telehealth: Payer: Self-pay

## 2022-01-15 NOTE — Telephone Encounter (Signed)
Left message on voicemail informing patient that [facility] Medicaid has been approved with effective dates and when will become Adult Medicaid Expansion, call back as needed.

## 2022-01-20 ENCOUNTER — Telehealth: Payer: Self-pay | Admitting: *Deleted

## 2022-01-20 ENCOUNTER — Ambulatory Visit: Payer: Self-pay | Admitting: Obstetrics and Gynecology

## 2022-01-20 ENCOUNTER — Telehealth: Payer: Self-pay | Admitting: Family Medicine

## 2022-01-20 NOTE — Telephone Encounter (Signed)
Donna [name] left a voice message this pm stating she is trying to call [place] back. Also wants to let doctor know she got medicaid approved and can be scheduled for procedure   and be put to sleep because of her anxiety.  Per chart plan was LEEP procedure  and to be done in OR if patient got medicaid approved.  Staci Acosta

## 2022-01-20 NOTE — Telephone Encounter (Signed)
Called patient to schedule leep procedure, there was no answer to the phone call so a voicemail was left with the call back number to the office. A letter was also mailed to the patient.

## 2022-01-21 NOTE — Telephone Encounter (Signed)
Hi Ladies!  I was able to run her insurance and her Medicaid is active. Subscriber ID is [id number] Zettie Cooley, CMA

## 2022-02-24 HISTORY — PX: ROBOTIC ASSISTED LAP VAGINAL HYSTERECTOMY: SHX2362

## 2022-02-25 ENCOUNTER — Telehealth: Payer: Self-pay | Admitting: Family Medicine

## 2022-02-25 ENCOUNTER — Encounter: Payer: Self-pay | Admitting: *Deleted

## 2022-02-25 NOTE — Telephone Encounter (Signed)
Patient returned call, notified her of surgey date/time/instructions.

## 2022-02-25 NOTE — Telephone Encounter (Signed)
Patient is calling wanting to know the status of her surgery

## 2022-02-25 NOTE — Telephone Encounter (Signed)
Telephone call to patient regarding LEEP procedure.  I have her procedure posted for 03/04/22 at [place] at [facility]   Patient not in, left message for her to call me for details and instructions.

## 2022-02-26 DIAGNOSIS — N871 Moderate cervical dysplasia: Secondary | ICD-10-CM | POA: Insufficient documentation

## 2022-02-26 NOTE — H&P (Signed)
Donna [name] is an 57 y.o. [phone] female.   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 Patient cannot tolerate LEEP in the office.  Past Medical History:  Diagnosis Date   Anxiety    Bladder incontinence    Chronic back pain    Fibromyalgia    Panic attack    PTSD (post-traumatic stress disorder)    Vaginal Pap smear, abnormal     Past Surgical History:  Procedure Laterality Date   APPENDECTOMY     BACK SURGERY     lumbar  fusions     PERINEAL WOUND CLOSURE      Family History  Problem Relation Age of Onset   Cancer Mother    Heart disease Mother    Stroke Mother    Hypertension Mother    Breast cancer Mother    Heart disease Father    Social History:  reports that she has never smoked. She has never used smokeless tobacco. She reports that she does not drink alcohol and does not use drugs.  Allergies:  Allergies  Allergen Reactions   Advil [Ibuprofen] Swelling   Celebrex [Celecoxib] Other (See Comments)    Facial edema    Oxycontin [Oxycodone Hcl] Other (See Comments)    Hallucinations.  Has taken morphine and other pain meds and is fine.    No medications prior to admission.    A comprehensive review of systems was negative.  There were no vitals taken for this visit. General appearance: alert, cooperative, appears stated age, and morbidly obese Head: Normocephalic, without obvious abnormality, atraumatic Neck: supple, symmetrical, trachea midline Lungs:  normal effort Heart: regular rate and rhythm Abdomen: soft, non-tender; bowel sounds normal; no masses,  no organomegaly Extremities: extremities normal, atraumatic, no cyanosis or edema Skin: Skin color, texture, turgor normal. No rashes or lesions Neurologic: Grossly normal   No results found for this or any previous visit (from the past 24 hour(s)). No results found.  Assessment/Plan Active Problems:   Dysplasia of cervix, high grade CIN 2  For  LEEP  Risks include but are not limited to bleeding, infection, injury to surrounding structures, including bowel, bladder and ureters, blood clots, and death.  Likelihood of success is high.    Donna [name] 02/26/2022, 1:13 PM

## 2022-02-27 ENCOUNTER — Encounter (HOSPITAL_BASED_OUTPATIENT_CLINIC_OR_DEPARTMENT_OTHER): Payer: Self-pay | Admitting: Family Medicine

## 2022-03-03 ENCOUNTER — Encounter (HOSPITAL_BASED_OUTPATIENT_CLINIC_OR_DEPARTMENT_OTHER): Payer: Self-pay | Admitting: Family Medicine

## 2022-03-03 NOTE — Progress Notes (Addendum)
Spoke w/ via phone for pre-op interview--- pt Lab needs dos----  cbc, urine preg  (istat / EKG per anes, possible may ask for it since pt has high bmi and elevated bp             Lab results------ no COVID test -----patient states asymptomatic no test needed Arrive at ------- 0700 on 03-04-2022 NPO after MN NO Solid Food.  Clear liquids from MN until--- 0600 Med rec completed Medications to take morning of surgery ----- clonazepam Diabetic medication ----- n/a Patient instructed no nail polish to be worn day of surgery Patient instructed to bring photo id and insurance card day of surgery Patient aware to have Driver (ride ) / caregiver    for 24 hours after surgery -- son, dillon Patient Special Instructions ----- n/a Pre-Op special Istructions ----- pt stated she has been told by previous ED visit she her bp high and stated she checks bp at pharmacy and it is high, it has been highest 115-120/ 110.  Stated has been without insurance and not able to be seen by a doctor .  She now has Medicaid and has appt next week with a pcp provider.   Pt will be assessed by anesthesia dos. Patient verbalized understanding of instructions that were given at this phone interview. Patient denies shortness of breath, chest pain, fever, cough at this phone interview.

## 2022-03-04 ENCOUNTER — Other Ambulatory Visit: Payer: Self-pay

## 2022-03-04 ENCOUNTER — Ambulatory Visit (HOSPITAL_BASED_OUTPATIENT_CLINIC_OR_DEPARTMENT_OTHER): Payer: Medicaid Other | Admitting: Anesthesiology

## 2022-03-04 ENCOUNTER — Encounter (HOSPITAL_BASED_OUTPATIENT_CLINIC_OR_DEPARTMENT_OTHER): Payer: Self-pay | Admitting: Family Medicine

## 2022-03-04 ENCOUNTER — Ambulatory Visit (HOSPITAL_BASED_OUTPATIENT_CLINIC_OR_DEPARTMENT_OTHER)
Admission: RE | Admit: 2022-03-04 | Discharge: 2022-03-04 | Disposition: A | Discharge status: Home / Self Care | Payer: Medicaid Other | Attending: Family Medicine | Admitting: Family Medicine

## 2022-03-04 ENCOUNTER — Encounter (HOSPITAL_BASED_OUTPATIENT_CLINIC_OR_DEPARTMENT_OTHER)
Admission: RE | Disposition: A | Discharge status: Home / Self Care | Payer: Self-pay | Source: Home / Self Care | Attending: Family Medicine

## 2022-03-04 DIAGNOSIS — N871 Moderate cervical dysplasia: Secondary | ICD-10-CM | POA: Diagnosis present

## 2022-03-04 DIAGNOSIS — Z6841 Body Mass Index (BMI) 40.0 and over, adult: Secondary | ICD-10-CM | POA: Diagnosis not present

## 2022-03-04 DIAGNOSIS — G8929 Other chronic pain: Secondary | ICD-10-CM | POA: Insufficient documentation

## 2022-03-04 DIAGNOSIS — F4310 Post-traumatic stress disorder, unspecified: Secondary | ICD-10-CM | POA: Insufficient documentation

## 2022-03-04 DIAGNOSIS — N938 Other specified abnormal uterine and vaginal bleeding: Secondary | ICD-10-CM | POA: Diagnosis not present

## 2022-03-04 DIAGNOSIS — Z01818 Encounter for other preprocedural examination: Secondary | ICD-10-CM

## 2022-03-04 DIAGNOSIS — C541 Malignant neoplasm of endometrium: Secondary | ICD-10-CM | POA: Diagnosis not present

## 2022-03-04 DIAGNOSIS — F418 Other specified anxiety disorders: Secondary | ICD-10-CM | POA: Insufficient documentation

## 2022-03-04 DIAGNOSIS — M797 Fibromyalgia: Secondary | ICD-10-CM | POA: Insufficient documentation

## 2022-03-04 HISTORY — DX: Personal history of diseases of the blood and blood-forming organs and certain disorders involving the immune mechanism: Z86.2

## 2022-03-04 HISTORY — DX: Elevated blood-pressure reading, without diagnosis of hypertension: R03.0

## 2022-03-04 HISTORY — DX: Mixed incontinence: N39.46

## 2022-03-04 HISTORY — DX: Gastro-esophageal reflux disease without esophagitis: K21.9

## 2022-03-04 HISTORY — PX: LEEP: SHX91

## 2022-03-04 HISTORY — DX: Migraine, unspecified, not intractable, without status migrainosus: G43.909

## 2022-03-04 HISTORY — DX: Other forms of dyspnea: R06.09

## 2022-03-04 HISTORY — DX: Personal history of other infectious and parasitic diseases: Z86.19

## 2022-03-04 HISTORY — DX: Personal history of other mental and behavioral disorders: Z86.59

## 2022-03-04 HISTORY — DX: Personal history of peptic ulcer disease: Z87.11

## 2022-03-04 HISTORY — DX: Localized edema: R60.0

## 2022-03-04 HISTORY — DX: Depression, unspecified: F32.A

## 2022-03-04 HISTORY — DX: Unspecified osteoarthritis, unspecified site: M19.90

## 2022-03-04 LAB — POCT I-STAT, CHEM 8
BUN: 12 mg/dL (ref 6–20)
Calcium, Ion: 1.09 mmol/L — ABNORMAL LOW (ref 1.15–1.40)
Chloride: 103 mmol/L (ref 98–111)
Creatinine, Ser: 0.5 mg/dL (ref 0.44–1.00)
Glucose, Bld: 124 mg/dL — ABNORMAL HIGH (ref 70–99)
HCT: 43 % (ref 36.0–46.0)
Hemoglobin: 14.6 g/dL (ref 12.0–15.0)
Potassium: 4 mmol/L (ref 3.5–5.1)
Sodium: 138 mmol/L (ref 135–145)
TCO2: 26 mmol/L (ref 22–32)

## 2022-03-04 LAB — CBC
HCT: 42.4 % (ref 36.0–46.0)
Hemoglobin: 13.8 g/dL (ref 12.0–15.0)
MCH: 29 pg (ref 26.0–34.0)
MCHC: 32.5 g/dL (ref 30.0–36.0)
MCV: 89.1 fL (ref 80.0–100.0)
Platelets: 377 10*3/uL (ref 150–400)
RBC: 4.76 MIL/uL (ref 3.87–5.11)
RDW: 13.2 % (ref 11.5–15.5)
WBC: 9.3 10*3/uL (ref 4.0–10.5)
nRBC: 0 % (ref 0.0–0.2)

## 2022-03-04 LAB — POCT PREGNANCY, URINE: Preg Test, Ur: NEGATIVE

## 2022-03-04 SURGERY — LEEP (LOOP ELECTROSURGICAL EXCISION PROCEDURE)
Anesthesia: General

## 2022-03-04 MED ORDER — GABAPENTIN 300 MG PO CAPS
ORAL_CAPSULE | ORAL | Status: AC
  Filled 2022-03-04: qty 1

## 2022-03-04 MED ORDER — MIDAZOLAM HCL 5 MG/5ML IJ SOLN
INTRAMUSCULAR | Status: DC | PRN
  Administered 2022-03-04: 2 mg via INTRAVENOUS

## 2022-03-04 MED ORDER — IODINE STRONG (LUGOLS) 5 % PO SOLN
ORAL | Status: AC
  Filled 2022-03-04: qty 1

## 2022-03-04 MED ORDER — IODINE STRONG (LUGOLS) 5 % PO SOLN
ORAL | Status: DC | PRN
  Administered 2022-03-04: 14 mL via ORAL

## 2022-03-04 MED ORDER — LACTATED RINGERS IV SOLN: INTRAVENOUS | Status: DC

## 2022-03-04 MED ORDER — SUCCINYLCHOLINE CHLORIDE 200 MG/10ML IV SOSY
PREFILLED_SYRINGE | INTRAVENOUS | Status: AC
  Filled 2022-03-04: qty 10

## 2022-03-04 MED ORDER — ONDANSETRON HCL 4 MG/2ML IJ SOLN
INTRAMUSCULAR | Status: DC | PRN
  Administered 2022-03-04: 4 mg via INTRAVENOUS

## 2022-03-04 MED ORDER — PROPOFOL 10 MG/ML IV BOLUS
INTRAVENOUS | Status: AC
  Filled 2022-03-04: qty 20

## 2022-03-04 MED ORDER — AMISULPRIDE (ANTIEMETIC) 5 MG/2ML IV SOLN
10.0000 mg | Freq: Once | INTRAVENOUS | Status: AC | PRN
  Administered 2022-03-04: 10 mg via INTRAVENOUS

## 2022-03-04 MED ORDER — PHENYLEPHRINE 80 MCG/ML (10ML) SYRINGE FOR IV PUSH (FOR BLOOD PRESSURE SUPPORT)
PREFILLED_SYRINGE | INTRAVENOUS | Status: AC
  Filled 2022-03-04: qty 10

## 2022-03-04 MED ORDER — LIDOCAINE 2% (20 MG/ML) 5 ML SYRINGE
INTRAMUSCULAR | Status: DC | PRN
  Administered 2022-03-04: 60 mg via INTRAVENOUS

## 2022-03-04 MED ORDER — FENTANYL CITRATE (PF) 100 MCG/2ML IJ SOLN
INTRAMUSCULAR | Status: DC | PRN
  Administered 2022-03-04: 100 ug via INTRAVENOUS

## 2022-03-04 MED ORDER — DEXAMETHASONE SODIUM PHOSPHATE 10 MG/ML IJ SOLN
INTRAMUSCULAR | Status: AC
  Filled 2022-03-04: qty 1

## 2022-03-04 MED ORDER — FENTANYL CITRATE (PF) 100 MCG/2ML IJ SOLN
INTRAMUSCULAR | Status: AC
  Filled 2022-03-04: qty 2

## 2022-03-04 MED ORDER — LIDOCAINE HCL (PF) 2 % IJ SOLN
INTRAMUSCULAR | Status: AC
  Filled 2022-03-04: qty 5

## 2022-03-04 MED ORDER — FENTANYL CITRATE (PF) 100 MCG/2ML IJ SOLN: 25.0000 ug | INTRAMUSCULAR | Status: DC | PRN

## 2022-03-04 MED ORDER — PROPOFOL 10 MG/ML IV BOLUS
INTRAVENOUS | Status: DC | PRN
  Administered 2022-03-04: 200 mg via INTRAVENOUS

## 2022-03-04 MED ORDER — ACETAMINOPHEN 500 MG PO TABS
1000.0000 mg | ORAL_TABLET | ORAL | Status: AC
  Administered 2022-03-04: 1000 mg via ORAL

## 2022-03-04 MED ORDER — BUPIVACAINE HCL (PF) 0.25 % IJ SOLN
INTRAMUSCULAR | Status: DC | PRN
  Administered 2022-03-04: 20 mL

## 2022-03-04 MED ORDER — GABAPENTIN 300 MG PO CAPS
300.0000 mg | ORAL_CAPSULE | ORAL | Status: AC
  Administered 2022-03-04: 300 mg via ORAL

## 2022-03-04 MED ORDER — ACETIC ACID 5 % SOLN
Status: AC
  Filled 2022-03-04: qty 50

## 2022-03-04 MED ORDER — PROMETHAZINE HCL 25 MG/ML IJ SOLN: 6.2500 mg | INTRAMUSCULAR | Status: DC | PRN

## 2022-03-04 MED ORDER — DEXAMETHASONE SODIUM PHOSPHATE 10 MG/ML IJ SOLN
INTRAMUSCULAR | Status: DC | PRN
  Administered 2022-03-04: 5 mg via INTRAVENOUS

## 2022-03-04 MED ORDER — AMISULPRIDE (ANTIEMETIC) 5 MG/2ML IV SOLN
INTRAVENOUS | Status: AC
  Filled 2022-03-04: qty 4

## 2022-03-04 MED ORDER — SUCCINYLCHOLINE CHLORIDE 200 MG/10ML IV SOSY
PREFILLED_SYRINGE | INTRAVENOUS | Status: DC | PRN
  Administered 2022-03-04: 160 mg via INTRAVENOUS

## 2022-03-04 MED ORDER — POVIDONE-IODINE 10 % EX SWAB: 2.0000 | Freq: Once | CUTANEOUS | Status: DC

## 2022-03-04 MED ORDER — ACETAMINOPHEN 500 MG PO TABS
ORAL_TABLET | ORAL | Status: AC
  Filled 2022-03-04: qty 2

## 2022-03-04 MED ORDER — ONDANSETRON HCL 4 MG/2ML IJ SOLN
INTRAMUSCULAR | Status: AC
  Filled 2022-03-04: qty 2

## 2022-03-04 MED ORDER — MIDAZOLAM HCL 2 MG/2ML IJ SOLN
INTRAMUSCULAR | Status: AC
  Filled 2022-03-04: qty 2

## 2022-03-04 MED ORDER — FERRIC SUBSULFATE 259 MG/GM EX SOLN
CUTANEOUS | Status: AC
  Filled 2022-03-04: qty 8

## 2022-03-04 MED ORDER — FERRIC SUBSULFATE (BULK) SOLN
Status: DC | PRN
  Administered 2022-03-04: 1

## 2022-03-04 SURGICAL SUPPLY — 38 items
APL SWBSTK 6 STRL LF DISP (MISCELLANEOUS) ×1
APPLICATOR COTTON TIP 6 STRL (MISCELLANEOUS) ×1 IMPLANT
APPLICATOR COTTON TIP 6IN STRL (MISCELLANEOUS) ×1
CATH ROBINSON RED A/P 14FR (CATHETERS) IMPLANT
CURETTE PIPELLE ENDOMTRL SUCTN (MISCELLANEOUS) IMPLANT
ELECT BALL LEEP 3MM BLK (ELECTRODE) IMPLANT
ELECT BALL LEEP 5MM RED (ELECTRODE) IMPLANT
ELECT LEEP 2.0X0.8 R2008 (MISCELLANEOUS)
ELECT LOOP LEEP RND 10X10 YLW (CUTTING LOOP) ×1
ELECT LOOP LEEP RND 15X12 GRN (CUTTING LOOP)
ELECT LOOP LEEP RND 20X12 WHT (CUTTING LOOP) ×1
ELECT LOOP LEEP SQR 10X10 ORG (CUTTING LOOP)
ELECT REM PT RETURN 9FT ADLT (ELECTROSURGICAL) ×1
ELECTRODE LEEP 2.0X0.8 R2008 (MISCELLANEOUS) IMPLANT
ELECTRODE LOOP LP RND 10X10YLW (CUTTING LOOP) IMPLANT
ELECTRODE LOOP LP RND 15X12GRN (CUTTING LOOP) IMPLANT
ELECTRODE LOOP LP RND 20X12WHT (CUTTING LOOP) IMPLANT
ELECTRODE LOOP LP SQR 10X10ORG (CUTTING LOOP) IMPLANT
ELECTRODE REM PT RTRN 9FT ADLT (ELECTROSURGICAL) ×1 IMPLANT
EXTENDER ELECT LOOP LEEP 10CM (CUTTING LOOP) IMPLANT
GAUZE 4X4 16PLY ~~LOC~~+RFID DBL (SPONGE) ×1 IMPLANT
GLOVE BIOGEL PI IND STRL 7.0 (GLOVE) ×2 IMPLANT
GLOVE ECLIPSE 7.0 STRL STRAW (GLOVE) ×2 IMPLANT
GOWN STRL REUS W/ TWL LRG LVL3 (GOWN DISPOSABLE) ×3 IMPLANT
GOWN STRL REUS W/TWL LRG LVL3 (GOWN DISPOSABLE) ×3
HIBICLENS CHG 4% 4OZ BTL (MISCELLANEOUS) ×1 IMPLANT
KIT TURNOVER CYSTO (KITS) ×2 IMPLANT
NS IRRIG 1000ML POUR BTL (IV SOLUTION) ×1 IMPLANT
PACK VAGINAL MINOR WOMEN LF (CUSTOM PROCEDURE TRAY) ×1 IMPLANT
PAD OB MATERNITY 4.3X12.25 (PERSONAL CARE ITEMS) ×1 IMPLANT
PENCIL BUTTON HOLSTER BLD 10FT (ELECTRODE) ×1 IMPLANT
PENCIL SMOKE EVACUATOR (MISCELLANEOUS) ×1 IMPLANT
PIPELLE ENDOMETRIAL SUCTION CU (MISCELLANEOUS) ×1
SCOPETTES 8  STERILE (MISCELLANEOUS) ×2
SCOPETTES 8 STERILE (MISCELLANEOUS) ×2 IMPLANT
SLEEVE SCD COMPRESS KNEE MED (STOCKING) ×1 IMPLANT
SUT VIC AB 0 CT1 27 (SUTURE)
SUT VIC AB 0 CT1 27XBRD ANBCTR (SUTURE) IMPLANT

## 2022-03-04 NOTE — Discharge Instructions (Addendum)
No acetaminophen/Tylenol until after 2 pm today if needed.    Post Anesthesia Home Care Instructions  Activity: Get plenty of rest for the remainder of the day. A responsible individual must stay with you for 24 hours following the procedure.  For the next 24 hours, DO NOT: -Drive a car -Paediatric nurse -Drink alcoholic beverages -Take any medication unless instructed by your physician -Make any legal decisions or sign important papers.  Meals: Start with liquid foods such as gelatin or soup. Progress to regular foods as tolerated. Avoid greasy, spicy, heavy foods. If nausea and/or vomiting occur, drink only clear liquids until the nausea and/or vomiting subsides. Call your physician if vomiting continues.  Special Instructions/Symptoms: Your throat may feel dry or sore from the anesthesia or the breathing tube placed in your throat during surgery. If this causes discomfort, gargle with warm salt water. The discomfort should disappear within 24 hours.

## 2022-03-04 NOTE — Interval H&P Note (Signed)
History and Physical Interval Note:  03/04/2022 8:20 AM  Donna [name]  has presented today for surgery, with the diagnosis of CIN2 and reports abnormal uterine bleeding today.  The various methods of treatment have been discussed with the patient and family. After consideration of risks, benefits and other options for treatment, the patient has consented to  Procedure(s): LOOP ELECTROSURGICAL EXCISION PROCEDURE (LEEP) (N/A) and endometrial biopsy as a surgical intervention.  The patient's history has been reviewed, patient examined, no change in status, stable for surgery.  I have reviewed the patient's chart and labs.  Questions were answered to the patient's satisfaction.     [name]

## 2022-03-04 NOTE — Transfer of Care (Signed)
Immediate Anesthesia Transfer of Care Note  Patient: Donna [name]  Procedure(s) Performed: LOOP ELECTROSURGICAL EXCISION PROCEDURE (LEEP)  Patient Location: PACU  Anesthesia Type:General  Level of Consciousness: awake, alert , oriented, and patient cooperative  Airway & Oxygen Therapy: Patient Spontanous Breathing and Patient connected to face mask oxygen  Post-op Assessment: Report given to RN and Post -op Vital signs reviewed and stable  Post vital signs: Reviewed and stable  Last Vitals:  Vitals Value Taken Time  BP 157/104 03/04/22 0931  Temp 36.6 C 03/04/22 0930  Pulse 80 03/04/22 0935  Resp 21 03/04/22 0935  SpO2 96 % 03/04/22 0935  Vitals shown include unvalidated device data.  Last Pain:  Vitals:   03/04/22 0930  TempSrc:   PainSc: Asleep      Patients Stated Pain Goal: 6 (10/62/69 [id number])  Complications: No notable events documented.

## 2022-03-04 NOTE — Anesthesia Postprocedure Evaluation (Signed)
Anesthesia Post Note  Patient: ADRINNE SZE  Procedure(s) Performed: LOOP ELECTROSURGICAL EXCISION PROCEDURE (LEEP) endometrial biopsy     Patient location during evaluation: PACU Anesthesia Type: General Level of consciousness: awake Pain management: pain level controlled Vital Signs Assessment: post-procedure vital signs reviewed and stable Respiratory status: spontaneous breathing, nonlabored ventilation and respiratory function stable Cardiovascular status: blood pressure returned to baseline and stable Postop Assessment: no apparent nausea or vomiting Anesthetic complications: no   No notable events documented.  Last Vitals:  Vitals:   03/04/22 1000 03/04/22 1020  BP: 136/79 (!) 146/89  Pulse: 74 72  Resp: 16 16  Temp:  36.6 C  SpO2: 96% 95%    Last Pain:  Vitals:   03/04/22 1020  TempSrc:   PainSc: 0-No pain                 Lavel Rieman P Zaydn Gutridge

## 2022-03-04 NOTE — Anesthesia Procedure Notes (Addendum)
Procedure Name: Intubation Date/Time: 03/04/2022 8:49 AM  Performed by: Rogers Blocker, CRNAPre-anesthesia Checklist: Patient identified, Emergency Drugs available, Suction available and Patient being monitored Patient Re-evaluated:Patient Re-evaluated prior to induction Oxygen Delivery Method: Circle System Utilized Preoxygenation: Pre-oxygenation with 100% oxygen Induction Type: IV induction Ventilation: Mask ventilation without difficulty Laryngoscope Size: Glidescope and 3 Grade View: Grade I Tube type: Parker flex tip Tube size: 7.0 mm Number of attempts: 1 Airway Equipment and Method: Video-laryngoscopy and Patient positioned with wedge pillow Placement Confirmation: ETT inserted through vocal cords under direct vision, positive ETCO2 and breath sounds checked- equal and bilateral Secured at: 22 cm Tube secured with: Tape Dental Injury: Teeth and Oropharynx as per pre-operative assessment

## 2022-03-04 NOTE — Anesthesia Preprocedure Evaluation (Addendum)
Anesthesia Evaluation  Patient identified by MRN, date of birth, ID band Patient awake    Reviewed: Allergy & Precautions, NPO status , Patient's Chart, lab work & pertinent test results  Airway Mallampati: III  TM Distance: >3 FB Neck ROM: Full    Dental  (+) Loose,    Pulmonary neg pulmonary ROS   Pulmonary exam normal        Cardiovascular negative cardio ROS Normal cardiovascular exam     Neuro/Psych  Headaches PSYCHIATRIC DISORDERS Anxiety Depression    PTSD (post-traumatic stress disorder)  Neuromuscular disease    GI/Hepatic negative GI ROS, Neg liver ROS,,,  Endo/Other    Morbid obesity  Renal/GU negative Renal ROS     Musculoskeletal  (+) Arthritis ,  Fibromyalgia -Chronic back pain   Abdominal   Peds  Hematology negative hematology ROS (+)   Anesthesia Other Findings CIN2  Reproductive/Obstetrics Hcg negative                             Anesthesia Physical Anesthesia Plan  ASA: 4  Anesthesia Plan: General   Post-op Pain Management:    Induction: Intravenous  PONV Risk Score and Plan: 3 and Ondansetron, Dexamethasone, Midazolam and Treatment may vary due to age or medical condition  Airway Management Planned: Oral ETT  Additional Equipment:   Intra-op Plan:   Post-operative Plan: Extubation in OR  Informed Consent: I have reviewed the patients History and Physical, chart, labs and discussed the procedure including the risks, benefits and alternatives for the proposed anesthesia with the patient or authorized representative who has indicated his/her understanding and acceptance.     Dental advisory given  Plan Discussed with: CRNA  Anesthesia Plan Comments:        Anesthesia Quick Evaluation

## 2022-03-04 NOTE — Op Note (Signed)
Preoperative diagnosis: CIN 2, abnormal uterine bleeding  Postoperative diagnosis: Same  Procedure: Endometrial biopsy, LEEP  Surgeon: Darron Doom, M.D.  Anesthesia: GETT - Ellender, Karyl Kinnier, MD Paracervical block  Findings: cervix was stuck up and difficult to maneuver  Estimated blood loss: 25 cc  Specimens: Endometrial biopsy, Cervical conization  Reason for procedure: SITLALI KOERNER [id number] with h/o CIN 2 and abnormal uterine bleeding who could not tolerate LEEP in the office.  Procedure: Patient was taken to the operating room where  analgesia was administered.She was prepped and draped in the usual sterile fashion. A timeout was performed. The patient had SCDs in place. The patient was in dorsal lithotomy.   The patient was placed in lithotomy position and the bivalved coated speculum was placed in the patient's vagina. A grounding pad placed on the patient. Pipelle introduced into the cervix and uterus sounded to 9 cm. Suction applied and sample obtained. Lugol's solution was applied to the cervix and areas of decreased uptake were noted around the transformation zone. The suction was turned on and the Medium Loop Biopsy on 50 Watts of blended current was used to excise the area of decreased uptake and excise the entire transformation zone. Excellent hemostasis was achieved using roller ball coagulation set at 50 Watts coagulation current. Monsel's solution was then applied and the speculum was removed from the vagina. Specimens were sent to pathology.A paracervical block was performed with 0.25% marcaine.   All instrument, needle and lap counts were correct x 2. The patient was taken to recovery in stable condition.  Aerika Groll SMD 03/04/2022 9:26 AM

## 2022-03-05 ENCOUNTER — Encounter (HOSPITAL_BASED_OUTPATIENT_CLINIC_OR_DEPARTMENT_OTHER): Payer: Self-pay | Admitting: Family Medicine

## 2022-03-06 ENCOUNTER — Telehealth: Payer: Self-pay | Admitting: Family Medicine

## 2022-03-06 LAB — SURGICAL PATHOLOGY

## 2022-03-06 MED ORDER — HYDROCODONE-ACETAMINOPHEN 5-325 MG PO TABS: 1.0000 | ORAL_TABLET | Freq: Four times a day (QID) | ORAL | 0 refills | Status: DC | PRN

## 2022-03-06 NOTE — Telephone Encounter (Signed)
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 Called patient with result and have notified GYN/ONC, that patient needs f/u. Briefly discussed reasons for biopsy. She is s/p EMB and LEEP. LEEP results not yet available.' Patient reports LLQ pain and bruising on her abdomen. We did not perform surgery there. Tylenol is not helping. Allergy to Ibuprofen. #5 Vicodin sent in. F/u with [facility]/ONC.

## 2022-03-07 ENCOUNTER — Telehealth: Payer: Self-pay | Admitting: *Deleted

## 2022-03-07 NOTE — Telephone Encounter (Signed)
Spoke with the patient regarding the referral to GYN oncology. Patient scheduled as new patient with Dr Berline Lopes on 1/22 at 12:45 pm.  Patient given an arrival time of 12:15 pm.  Explained to the patient the the doctor will perform a pelvic exam at this visit. Patient given the policy that no visitors under the 16 yrs are allowed in the [place]. Patient given the address/phone number for the clinic and that the center offers free valet service. Patient aware of the new mask mandate.

## 2022-03-13 ENCOUNTER — Encounter: Payer: Self-pay | Admitting: Gynecologic Oncology

## 2022-03-14 ENCOUNTER — Ambulatory Visit: Payer: Self-pay | Admitting: Family Medicine
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
## 2022-03-17 ENCOUNTER — Encounter: Payer: Self-pay | Admitting: Gynecologic Oncology

## 2022-03-17 ENCOUNTER — Ambulatory Visit (HOSPITAL_COMMUNITY)
Admission: RE | Admit: 2022-03-17 | Discharge: 2022-03-17 | Disposition: A | Discharge status: Home / Self Care | Payer: Medicaid Other | Source: Ambulatory Visit | Attending: Gynecologic Oncology | Admitting: Gynecologic Oncology

## 2022-03-17 ENCOUNTER — Other Ambulatory Visit: Payer: Self-pay

## 2022-03-17 ENCOUNTER — Telehealth: Payer: Self-pay | Admitting: *Deleted

## 2022-03-17 ENCOUNTER — Inpatient Hospital Stay: Payer: Medicaid Other | Attending: Psychiatry | Admitting: Gynecologic Oncology

## 2022-03-17 VITALS — BP 132/73 | HR 86 | Temp 98.3°F | Resp 16 | Wt 325.6 lb

## 2022-03-17 DIAGNOSIS — R600 Localized edema: Secondary | ICD-10-CM | POA: Insufficient documentation

## 2022-03-17 DIAGNOSIS — M1990 Unspecified osteoarthritis, unspecified site: Secondary | ICD-10-CM | POA: Insufficient documentation

## 2022-03-17 DIAGNOSIS — M79604 Pain in right leg: Secondary | ICD-10-CM | POA: Diagnosis not present

## 2022-03-17 DIAGNOSIS — G8929 Other chronic pain: Secondary | ICD-10-CM | POA: Insufficient documentation

## 2022-03-17 DIAGNOSIS — Z803 Family history of malignant neoplasm of breast: Secondary | ICD-10-CM | POA: Insufficient documentation

## 2022-03-17 DIAGNOSIS — K219 Gastro-esophageal reflux disease without esophagitis: Secondary | ICD-10-CM | POA: Insufficient documentation

## 2022-03-17 DIAGNOSIS — F32A Depression, unspecified: Secondary | ICD-10-CM | POA: Insufficient documentation

## 2022-03-17 DIAGNOSIS — C541 Malignant neoplasm of endometrium: Secondary | ICD-10-CM | POA: Diagnosis not present

## 2022-03-17 DIAGNOSIS — N871 Moderate cervical dysplasia: Secondary | ICD-10-CM | POA: Insufficient documentation

## 2022-03-17 DIAGNOSIS — Z79890 Hormone replacement therapy (postmenopausal): Secondary | ICD-10-CM | POA: Insufficient documentation

## 2022-03-17 DIAGNOSIS — Z6841 Body Mass Index (BMI) 40.0 and over, adult: Secondary | ICD-10-CM | POA: Insufficient documentation

## 2022-03-17 DIAGNOSIS — Z91414 Personal history of adult intimate partner abuse: Secondary | ICD-10-CM | POA: Insufficient documentation

## 2022-03-17 DIAGNOSIS — Z79899 Other long term (current) drug therapy: Secondary | ICD-10-CM | POA: Insufficient documentation

## 2022-03-17 DIAGNOSIS — Z8042 Family history of malignant neoplasm of prostate: Secondary | ICD-10-CM | POA: Insufficient documentation

## 2022-03-17 DIAGNOSIS — M797 Fibromyalgia: Secondary | ICD-10-CM | POA: Insufficient documentation

## 2022-03-17 DIAGNOSIS — N939 Abnormal uterine and vaginal bleeding, unspecified: Secondary | ICD-10-CM | POA: Insufficient documentation

## 2022-03-17 DIAGNOSIS — M549 Dorsalgia, unspecified: Secondary | ICD-10-CM | POA: Insufficient documentation

## 2022-03-17 DIAGNOSIS — R32 Unspecified urinary incontinence: Secondary | ICD-10-CM | POA: Insufficient documentation

## 2022-03-17 DIAGNOSIS — F419 Anxiety disorder, unspecified: Secondary | ICD-10-CM | POA: Insufficient documentation

## 2022-03-17 DIAGNOSIS — Z800 Family history of malignant neoplasm of digestive organs: Secondary | ICD-10-CM | POA: Insufficient documentation

## 2022-03-17 MED ORDER — MEDROXYPROGESTERONE ACETATE 10 MG PO TABS: 10.0000 mg | ORAL_TABLET | Freq: Every day | ORAL | 1 refills | Status: DC

## 2022-03-17 NOTE — Telephone Encounter (Signed)
Thank you. Could you please let patient know?

## 2022-03-17 NOTE — Telephone Encounter (Signed)
Call from [facility] in vascular lab. Right venous doppler negative for obvious DVT.

## 2022-03-17 NOTE — Telephone Encounter (Signed)
Call to patient. Notified of negative doppler.  Patient states she had already received results from lab. Plans to contact PCP and plan referral to ortho for further evaluation.

## 2022-03-17 NOTE — Progress Notes (Signed)
Right lower extremity venous duplex has been completed. Preliminary results can be found in CV Proc through chart review.  Results were given to Gay Filler at [facility]' NP office.  03/17/22 1:56 PM Donna [name] RVT

## 2022-03-17 NOTE — Patient Instructions (Addendum)
It was very nice to meet you today.  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  The treatment for this is surgery, which will include a total hysterectomy with removal of your fallopian tubes and ovaries.  We will also plan to sample some lymph nodes, hopefully with a sentinel lymph node biopsy procedure.  If you do not tolerate the position to have a hysterectomy as we discussed today, our backup plan will be to perform a D&C and put in a progesterone secreting IUD.  Because we will wait about 6 weeks from the time of your recent procedure to allow tissue to heal, I have sent in a prescription for progesterone which will hopefully slow down and may even stop your bleeding.  We will get an ultrasound to evaluate the uterus.  We will also get an ultrasound to make sure that there is no blood clot in your right leg.  Given your family history, I have placed a referral for you to meet with one of our genetic counselors to discuss genetic testing.  We have ordered a doppler to evaluate your right leg pain. Dr. Berline Lopes recommends meeting with an orthopedic doctor if you can.   We will tentatively plan for surgery at [facility] with Dr. Jeral Pinch. We will see you back in the office closer to the date for a preop appointment with Joylene John NP to discuss the instructions for before and after surgery.  You may also receive a phone call from the hospital to arrange for a pre-op appointment there as well. Usually both appointments can be combined on the same day.  We will discuss the instructions below at your preop visit.   Preparing for your Surgery  Plan for surgery on April 23, 2022 with Dr. Jeral Pinch at [place] will be scheduled for robotic assisted total laparoscopic hysterectomy (removal of the uterus and cervix), bilateral salpingo-oophorectomy (removal of  both ovaries and fallopian tubes), sentinel lymph node biopsy, possible lymph node dissection, possible laparotomy (larger incision on your abdomen if needed).  We will need to get you in for evaluation with the urologist. We will also need to obtain cardiac and PCP clearance before surgery.    Pre-operative Testing -You will receive a phone call from presurgical testing at [facility] to arrange for a pre-operative appointment and lab work.  -Bring your insurance card, copy of an advanced directive if applicable, medication list  -At that visit, you will be asked to sign a consent for a possible blood transfusion in case a transfusion becomes necessary during surgery.  The need for a blood transfusion is rare but having consent is a necessary part of your care.     -You should not be taking blood thinners or aspirin at least ten days prior to surgery unless instructed by your surgeon.  -Do not take supplements such as fish oil (omega 3), red yeast rice, turmeric before your surgery. You want to avoid medications with aspirin in them including headache powders such as BC or Goody's), Excedrin migraine.  Day Before Surgery at [place] will be asked to take in a light diet the day before surgery. You will be advised you can have clear liquids up until 3 hours before your surgery.    Eat a light diet the day before surgery.  Examples including soups, broths, toast, yogurt, mashed potatoes.  AVOID GAS PRODUCING FOODS. Things to avoid include carbonated beverages (fizzy beverages, sodas), raw fruits and raw vegetables (uncooked), or beans.   If your bowels are filled with gas, your surgeon will have difficulty visualizing your pelvic organs which increases your surgical risks.  Your role in recovery Your role is to become active as soon as directed by your doctor, while still giving yourself time to heal.  Rest when you feel tired. You will be asked to do the following in order to speed  your recovery:  - Cough and breathe deeply. This helps to clear and expand your lungs and can prevent pneumonia after surgery.  - [place]. Do mild physical activity. Walking or moving your legs help your circulation and body functions return to normal. Do not try to get up or walk alone the first time after surgery.   -If you develop swelling on one leg or the other, pain in the back of your leg, redness/warmth in one of your legs, please call the office or go to the Emergency Room to have a doppler to rule out a blood clot. For shortness of breath, chest pain-seek care in the Emergency Room as soon as possible. - Actively manage your pain. Managing your pain lets you move in comfort. We will ask you to rate your pain on a scale of zero to 10. It is your responsibility to tell your doctor or nurse where and how much you hurt so your pain can be treated.  Special Considerations -If you are diabetic, you may be placed on insulin after surgery to have closer control over your blood sugars to promote healing and recovery.  This does not mean that you will be discharged on insulin.  If applicable, your oral antidiabetics will be resumed when you are tolerating a solid diet.  -Your final pathology results from surgery should be available around one week after surgery and the results will be relayed to you when available.  -FMLA forms can be faxed to [phone] and please allow 5-7 business days for completion.  Pain Management After Surgery -You will be prescribed pain medication and bowel regimen medications after surgery.   -Make sure that you have Tylenol IF YOU ARE ABLE TO TAKE THESE MEDICATION at home to use on a regular basis after surgery for pain control.   -Review the attached handout on narcotic use and their risks and side effects.   Bowel Regimen -You will be prescribed Sennakot-S to take nightly to prevent constipation especially if you are taking the narcotic  pain medication intermittently.  It is important to prevent constipation and drink adequate amounts of liquids. You can stop taking this medication when you are not taking pain medication and you are back on your normal bowel routine.  Risks of Surgery Risks of surgery are low but include bleeding, infection, damage to surrounding structures, re-operation, blood clots, and very rarely death.   Blood Transfusion Information (For the consent to be signed before surgery)  We will be checking your blood type before surgery so in case of emergencies, we will know what type of blood you would need.                                            WHAT IS A BLOOD TRANSFUSION?  A transfusion is the replacement of blood or some of  its parts. Blood is made up of multiple cells which provide different functions. Red blood cells carry oxygen and are used for blood loss replacement. White blood cells fight against infection. Platelets control bleeding. Plasma helps clot blood. Other blood products are available for specialized needs, such as hemophilia or other clotting disorders. BEFORE THE TRANSFUSION  Who gives blood for transfusions?  You may be able to donate blood to be used at a later date on yourself (autologous donation). Relatives can be asked to donate blood. This is generally not any safer than if you have received blood from a stranger. The same precautions are taken to ensure safety when a relative's blood is donated. Healthy volunteers who are fully evaluated to make sure their blood is safe. This is blood bank blood. Transfusion therapy is the safest it has ever been in the practice of medicine. Before blood is taken from a donor, a complete history is taken to make sure that person has no history of diseases nor engages in risky social behavior (examples are intravenous drug use or sexual activity with multiple partners). The donor's travel history is screened to minimize risk of transmitting  infections, such as malaria. The donated blood is tested for signs of infectious diseases, such as HIV and hepatitis. The blood is then tested to be sure it is compatible with you in order to minimize the chance of a transfusion reaction. If you or a relative donates blood, this is often done in anticipation of surgery and is not appropriate for emergency situations. It takes many days to process the donated blood. RISKS AND COMPLICATIONS Although transfusion therapy is very safe and saves many lives, the main dangers of transfusion include:  Getting an infectious disease. Developing a transfusion reaction. This is an allergic reaction to something in the blood you were given. Every precaution is taken to prevent this. The decision to have a blood transfusion has been considered carefully by your caregiver before blood is given. Blood is not given unless the benefits outweigh the risks.  AFTER SURGERY INSTRUCTIONS  Return to work: 4-6 weeks if applicable  Activity: 1. Be up and out of the bed during the day.  Take a nap if needed.  You may walk up steps but be careful and use the hand rail.  Stair climbing will tire you more than you think, you may need to stop part way and rest.   2. No lifting or straining for 6 weeks over 10 pounds. No pushing, pulling, straining for 6 weeks.  3. No driving for around 1 week(s).  Do not drive if you are taking narcotic pain medicine and make sure that your reaction time has returned.   4. You can shower as soon as the next day after surgery. Shower daily.  Use your regular soap and water (not directly on the incision) and pat your incision(s) dry afterwards; don't rub.  No tub baths or submerging your body in water until cleared by your surgeon. If you have the soap that was given to you by pre-surgical testing that was used before surgery, you do not need to use it afterwards because this can irritate your incisions.   5. No sexual activity and nothing in the  vagina for 10-12 weeks.  6. You may experience a small amount of clear drainage from your incisions, which is normal.  If the drainage persists, increases, or changes color please call the office.  7. Do not use creams, lotions, or ointments such as  neosporin on your incisions after surgery until advised by your surgeon because they can cause removal of the dermabond glue on your incisions.    8. You may experience vaginal spotting after surgery or around the 6-8 week mark from surgery when the stitches at the top of the vagina begin to dissolve.  The spotting is normal but if you experience heavy bleeding, call our office.  9. Take Tylenol or ibuprofen first for pain if you are able to take these medications and only use narcotic pain medication for severe pain not relieved by the Tylenol or Ibuprofen.  Monitor your Tylenol intake to a max of 4,000 mg in a 24 hour period. You can alternate these medications after surgery.  Diet: 1. Low sodium Heart Healthy Diet is recommended but you are cleared to resume your normal (before surgery) diet after your procedure.  2. It is safe to use a laxative, such as Miralax or Colace, if you have difficulty moving your bowels. You have been prescribed Sennakot-S to take at bedtime every evening after surgery to keep bowel movements regular and to prevent constipation.    Wound Care: 1. Keep clean and dry.  Shower daily.  Reasons to call the Doctor: Fever - Oral temperature greater than 100.4 degrees Fahrenheit Foul-smelling vaginal discharge Difficulty urinating Nausea and vomiting Increased pain at the site of the incision that is unrelieved with pain medicine. Difficulty breathing with or without chest pain New calf pain especially if only on one side Sudden, continuing increased vaginal bleeding with or without clots.   Contacts: For questions or concerns you should contact:  Dr. Jeral Pinch at [phone]  Joylene John, NP at  [phone]  After Hours: call [phone] and have the GYN Oncologist paged/contacted (after 5 pm or on the weekends).  Messages sent via mychart are for non-urgent matters and are not responded to after hours so for urgent needs, please call the after hours number.

## 2022-03-20 ENCOUNTER — Ambulatory Visit (INDEPENDENT_AMBULATORY_CARE_PROVIDER_SITE_OTHER): Payer: Medicaid Other | Admitting: Family Medicine

## 2022-03-20 ENCOUNTER — Encounter: Payer: Self-pay | Admitting: Family Medicine

## 2022-03-20 VITALS — BP 128/77 | HR 85 | Wt 333.9 lb

## 2022-03-20 DIAGNOSIS — N871 Moderate cervical dysplasia: Secondary | ICD-10-CM | POA: Diagnosis not present

## 2022-03-20 DIAGNOSIS — C541 Malignant neoplasm of endometrium: Secondary | ICD-10-CM | POA: Diagnosis not present

## 2022-03-20 NOTE — Assessment & Plan Note (Signed)
On provera, f/u per GYN/ONC

## 2022-03-20 NOTE — Assessment & Plan Note (Signed)
Treated and scheduled for [facility]

## 2022-03-20 NOTE — Progress Notes (Signed)
[facility]  Subjective:    Patient ID: Donna [name] is a 57 y.o. female presenting with Post-op Follow-up  on 03/20/2022  HPI: Patient is s/p in OR LEEP with CIN1 and focal CIN2 and negative margins, and EMB showing endometrial carcinoma--seen by GYN/ONC and scheduled for surgery. On provera. No further bleeding.  Review of Systems  Constitutional:  Negative for chills and fever.  Respiratory:  Negative for shortness of breath.   Cardiovascular:  Negative for chest pain.  Gastrointestinal:  Negative for abdominal pain, nausea and vomiting.  Genitourinary:  Negative for dysuria.  Skin:  Negative for rash.      Objective:    BP 128/77   Pulse 85   Wt (!) 333 lb 14.4 oz (151.5 kg)   LMP 02/24/2022 (Approximate)   BMI 50.77 kg/m  Physical Exam Exam conducted with a chaperone present.  Constitutional:      General: She is not in acute distress.    Appearance: She is well-developed.  HENT:     Head: Normocephalic and atraumatic.  Eyes:     General: No scleral icterus. Cardiovascular:     Rate and Rhythm: Normal rate.  Pulmonary:     Effort: Pulmonary effort is normal.  Abdominal:     Palpations: Abdomen is soft.  Musculoskeletal:     Cervical back: Neck supple.  Skin:    General: Skin is warm and dry.  Neurological:     Mental Status: She is alert and oriented to person, place, and time.         Assessment & Plan:   Problem List Items Addressed This Visit       Unprioritized   Dysplasia of cervix, high grade CIN 2 - Primary    Treated and scheduled for [facility]      Endometrial cancer determined by uterine biopsy ([place])    On provera, f/u per GYN/ONC       Return if symptoms worsen or fail to improve.  [name], MD 03/20/2022 1:42 PM

## 2022-03-21 ENCOUNTER — Other Ambulatory Visit: Payer: Self-pay | Admitting: Gynecologic Oncology

## 2022-03-21 DIAGNOSIS — C541 Malignant neoplasm of endometrium: Secondary | ICD-10-CM

## 2022-03-24 ENCOUNTER — Ambulatory Visit (HOSPITAL_COMMUNITY)
Admission: RE | Admit: 2022-03-24 | Discharge: 2022-03-24 | Disposition: A | Discharge status: Home / Self Care | Payer: Medicaid Other | Source: Ambulatory Visit | Attending: Gynecologic Oncology | Admitting: Gynecologic Oncology

## 2022-03-24 DIAGNOSIS — C541 Malignant neoplasm of endometrium: Secondary | ICD-10-CM | POA: Diagnosis present

## 2022-03-25 ENCOUNTER — Telehealth: Payer: Self-pay | Admitting: *Deleted

## 2022-03-25 NOTE — Telephone Encounter (Signed)
Call to patient and advised of response from Joylene John, NP as below:  Joylene John D, NP to You; 2 others 1 hour ago Thank you for the update. No changes in current plan. Tell the pt thank you for keeping [place] updated.   Advised patient to call back if bleeding increases or any other changes.  Reports that pain prior to bleeding is enough to make her cry. Usually lasts from 2-6 hours approximately 3 times/day. Had hydrocodone from LEEP procedure which is the only thing that has helped this pain. She is out of that now. Discussed that hydrocodone not usually given over phone and recommend alternating Tylenol and Motrin. Patient allergic to motrin and cannot take. Reports Tylenol does not help and  patient feels this is not taken seriously. Has appointment with pain management on Monday and had psychiatry appointment yesterday for panic attacks.  Patient feels pain usually occurs prior to episode of bleeding and that hormones are not stopping the bleeding. Discussed that healing after LEEP may be cause of some bleeding and ultrasound probe may have caused some bleeding.   Advised will review pain with Joylene John NP for additional instruction.

## 2022-03-25 NOTE — Telephone Encounter (Signed)
Call to patient. Left message to call back to schedule recheck appointment tomorrow.

## 2022-03-25 NOTE — Telephone Encounter (Addendum)
Patient called and stated "I had my [place] yesterday and after that I started spotting. I am having some clots and the color is bright red. I did have some cramping but it is better today. The bleeding is better toady too. I do "free bleed" I don't wear underwear. I still am taking the hormone pills, but thought that I should let Dr Berline Lopes know." Explained that the message would be given to Dr Chancy Hurter APP I; and the office would call her back

## 2022-03-26 ENCOUNTER — Other Ambulatory Visit: Payer: Self-pay | Admitting: *Deleted

## 2022-03-26 ENCOUNTER — Telehealth: Payer: Self-pay | Admitting: Licensed Clinical Social Worker

## 2022-03-26 ENCOUNTER — Telehealth: Payer: Self-pay | Admitting: *Deleted

## 2022-03-26 ENCOUNTER — Ambulatory Visit: Payer: Medicaid Other | Admitting: Gynecologic Oncology

## 2022-03-26 DIAGNOSIS — C541 Malignant neoplasm of endometrium: Secondary | ICD-10-CM

## 2022-03-26 NOTE — Telephone Encounter (Signed)
[place] [facility]  Clinical Social Work was referred by nurse for assessment of psychosocial needs.  Clinical Social Worker attempted to contact patient by phone  to offer support and assess for needs.   No answer. Left VM with direct contact information.     Wadie Liew E Calaya Gildner, LCSW  Clinical Social Worker [place]        Patient is participating in a Managed Medicaid Plan:  Yes

## 2022-03-26 NOTE — Telephone Encounter (Signed)
Per Dr Berline Lopes fax records and referral to [facility]

## 2022-03-26 NOTE — Telephone Encounter (Signed)
Voice mail from patient. States she cannot come to 230 appointment today.  Message states she has stopped bleeding and does not want exam to restart it. Not feeling well, feeling overwhelmed with doctor appointments and needs day to rest. States she will see [place] for next scheduled appointment and will call back if bleeding restarts.

## 2022-03-26 NOTE — Telephone Encounter (Signed)
Voice mail from patient- requested late afternoon on Wednesday. Can leave message with time.   Call back to patient and left message to arrive at 215 for 230 appointment.

## 2022-03-27 ENCOUNTER — Telehealth: Payer: Self-pay | Admitting: Licensed Clinical Social Worker

## 2022-03-27 NOTE — Telephone Encounter (Signed)
[place] Work  2nd attempt to contact pt by phone to introduce support services and assess for psychosocial needs. No answer. Left VM. Also sending MyChart message.   Omelia Marquart E Prentis Langdon, LCSW

## 2022-03-28 ENCOUNTER — Inpatient Hospital Stay: Payer: Medicaid Other | Attending: Psychiatry | Admitting: Licensed Clinical Social Worker

## 2022-03-28 ENCOUNTER — Telehealth: Payer: Self-pay | Admitting: *Deleted

## 2022-03-28 ENCOUNTER — Encounter: Payer: Self-pay | Admitting: Gynecologic Oncology

## 2022-03-28 NOTE — Progress Notes (Signed)
[facility] Work  Initial Assessment   Donna [name] is a 57 y.o. year old female contacted by phone. Clinical Social Work was referred by nurse for assessment of psychosocial needs.   SDOH (Social Determinants of Health) assessments performed: Yes SDOH Interventions    Flowsheet Row Clinical Support from 03/28/2022 in [place] at [place] Management from 06/27/2019 in [place] Office Visit from 06/13/2019 in [place]  SDOH Interventions     Food Insecurity Interventions [id number] Referral, Other (Comment)  [food pantry list] Other (Comment)  [patient started receiving food stamps 2 months ago.  Utilizes area pantries when needed.] --  Housing Interventions Other (Comment)  [salvation army] -- --  Depression Interventions/Treatment  -- --  [informed her that clinic counselor has attempted to contact her] --  [Provider is aware]  Financial Strain Interventions [id number] Referral, Other (Comment)  [resource list, DSS, cancer foundations] Other (Comment)  [Referral to Care Guide for assistance with Medicaid application.  Referal to [place]: Food Insecurity Present (03/28/2022)  Housing: Medium Risk (03/28/2022)  Transportation Needs: Unmet Transportation Needs (07/18/2021)  Depression (PHQ2-9): High Risk (03/20/2022)  Financial Resource Strain: High Risk (03/28/2022)  Tobacco Use: Low Risk  (03/20/2022)     Distress Screen completed: No     No data to display            Family/Social Information:  Housing Arrangement: patient lives alone. Previously had 2 renters but they both moved out Family members/support persons in your life? Family- son Employment: Disabled but not receiving disability- plans to reapply once medical appts are made and records updated.  Income source: No income- previously had income from renters, but  not currently Financial concerns: Yes, current concerns Type of concern: Utilities and Food Food access concerns: yes Services Currently in place:  Medicaid, psychiatry, PCP. Being connected with counseling  Coping/ Adjustment to diagnosis: Patient understands treatment plan and what happens next? yes, is overwhelmed at this time because, in addition to cancer and upcoming surgery, she has prior existing pain, PTSD, anxiety, fibromyalgia, and other physical issues which have been unmanaged in recent past as she was uninsured. These are being addressed now as she reestablishes care, but is still very difficult to cope as she is in near constant pain. Concerns about diagnosis and/or treatment: How I will pay for the services I need Patient reported stressors: Finances and Physical issues Current coping skills/ strengths: Other: supportive son; aware of some community resources    SUMMARY: Current SDOH Barriers:  Mental Health Concerns  and financial constraints  Clinical Social Work Clinical Goal(s):  Explore community resource options for unmet needs related to:  Housing , Sales promotion account executive , and Food Insecurity   Interventions: Informed patient of the support team roles and support services at [facility] Provided [place] contact information and encouraged patient to call with any questions or concerns Provided patient with information about local resources for potential assistance (programs through [employer], [employer], food pantries, and cancer foundations) Referral made to food pantry and to [employer]   Follow Up Plan: Patient will contact CSW with any support or resource needs and Patient will apply for services through [place] Patient verbalizes understanding of plan: Yes    Len Kluver E Corwin Kuiken, LCSW   Patient is participating in a Managed Medicaid Plan:  Yes

## 2022-03-28 NOTE — Telephone Encounter (Signed)
[facility] doesn't accept her insurance, unable to schedule referral. MD notified

## 2022-04-01 ENCOUNTER — Telehealth: Payer: Self-pay | Admitting: *Deleted

## 2022-04-01 NOTE — Telephone Encounter (Signed)
Per Dr Berline Lopes fax records and surgical optimization form to the PCP and cardiology office

## 2022-04-10 ENCOUNTER — Encounter: Payer: Self-pay | Admitting: Obstetrics and Gynecology

## 2022-04-10 ENCOUNTER — Ambulatory Visit (INDEPENDENT_AMBULATORY_CARE_PROVIDER_SITE_OTHER): Payer: Medicaid Other | Admitting: Obstetrics and Gynecology

## 2022-04-10 VITALS — BP 133/75 | HR 80 | Ht 67.0 in | Wt 333.2 lb

## 2022-04-10 DIAGNOSIS — R350 Frequency of micturition: Secondary | ICD-10-CM

## 2022-04-10 DIAGNOSIS — N393 Stress incontinence (female) (male): Secondary | ICD-10-CM

## 2022-04-10 DIAGNOSIS — N3281 Overactive bladder: Secondary | ICD-10-CM

## 2022-04-10 LAB — POCT URINALYSIS DIPSTICK
Blood, UA: POSITIVE
Glucose, UA: NEGATIVE
Ketones, UA: NEGATIVE
Leukocytes, UA: NEGATIVE
Nitrite, UA: NEGATIVE
Protein, UA: POSITIVE — AB
Spec Grav, UA: >=1.03 — AB (ref 1.010–1.025)
Urobilinogen, UA: 0.2 E.U./dL
pH, UA: 6 (ref 5.0–8.0)

## 2022-04-10 MED ORDER — SOLIFENACIN SUCCINATE 10 MG PO TABS: 10.0000 mg | ORAL_TABLET | Freq: Every day | ORAL | 5 refills | Status: DC
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
## 2022-04-10 NOTE — Patient Instructions (Addendum)
Today we talked about ways to manage bladder urgency such as altering your diet to avoid irritative beverages and foods (bladder diet) as well as attempting to decrease stress and other exacerbating factors.   The Most Bothersome Foods* The Least Bothersome Foods*  Coffee - Regular & Decaf Tea - caffeinated Carbonated beverages - cola, non-colas, diet & caffeine-free Alcohols - Beer, Red Wine, White Wine, Champagne Fruits - Grapefruit, [place], Orange, [employer] - Cranberry, Grapefruit, Orange, Pineapple Vegetables - Tomato & Tomato Products Flavor Enhancers - Hot peppers, Spicy foods, Chili, Horseradish, Vinegar, Monosodium glutamate (MSG) Artificial Sweeteners - NutraSweet, Sweet 'N Low, Equal (sweetener), Saccharin Ethnic foods - [place], [place], [place] food Express Scripts - low-fat & whole Fruits - Bananas, Blueberries, Honeydew melon, Pears, Raisins, Watermelon Vegetables - Broccoli, Brussels Sprouts, [place], Carrots, Cauliflower, [place], Cucumber, Mushrooms, Peas, Radishes, Squash, Zucchini, White potatoes, Sweet potatoes & yams Poultry - Chicken, Eggs, [place], [employer] - Beef, Programmer, multimedia, Lamb Seafood - Shrimp, [place] fish, Salmon Grains - Oat, Rice Snacks - Pretzels, Popcorn  *Lissa Morales et al. Diet and its role in interstitial cystitis/bladder pain syndrome (IC/BPS) and comorbid conditions. [place] 2012 Jan 11.   We discussed the symptoms of overactive bladder (OAB), which include urinary urgency, urinary frequency, night-time urination, with or without urge incontinence.  We discussed management including behavioral therapy (decreasing bladder irritants by following a bladder diet, urge suppression strategies, timed voids, bladder retraining), physical therapy, medication; and for refractory cases posterior tibial nerve stimulation, sacral neuromodulation, and intravesical botulinum toxin injection.   For treatment of stress urinary incontinence, which is leakage  with physical activity/movement/strainging/coughing, we discussed expectant management versus nonsurgical options versus surgery. Nonsurgical options include weight loss, physical therapy, as well as a pessary.  Surgical options include a midurethral sling, which is a synthetic mesh sling that acts like a hammock under the urethra to prevent leakage of urine, a Burch urethropexy, and transurethral injection of a bulking agent.

## 2022-04-11 ENCOUNTER — Encounter (HOSPITAL_COMMUNITY): Payer: Self-pay

## 2022-04-11 NOTE — Progress Notes (Signed)
PCP - Emelia Loron, MD    Cardiologist - Dr. Tonia Ghent Clearance on chart  PPM/ICD -  Device Orders -  Rep Notified -   Chest x-ray -  EKG - 03-04-22 epic Stress Test -  ECHO -  Cardiac Cath -   Sleep Study -  CPAP -   Fasting Blood Sugar -  Checks Blood Sugar _____ times a day  Blood Thinner Instructions: Aspirin Instructions:  ERAS Protcol - PRE-SURGERY    COVID vaccine -  Activity--Able to complete ADL's without SOB Anesthesia review: HTN, per Dr. Berline Lopes note in epic 03-17-22 pt. Needs cardiac clearance  Patient denies shortness of breath, fever, cough and chest pain at PAT appointment   All instructions explained to the patient, with a verbal understanding of the material. Patient agrees to go over the instructions while at home for a better understanding. Patient also instructed to self quarantine after being tested for COVID-19. The opportunity to ask questions was provided.

## 2022-04-14 ENCOUNTER — Encounter: Payer: Self-pay | Admitting: *Deleted

## 2022-04-15 ENCOUNTER — Encounter (HOSPITAL_COMMUNITY): Payer: Self-pay

## 2022-04-15 ENCOUNTER — Inpatient Hospital Stay (HOSPITAL_BASED_OUTPATIENT_CLINIC_OR_DEPARTMENT_OTHER): Payer: Medicaid Other | Admitting: Gynecologic Oncology

## 2022-04-15 ENCOUNTER — Encounter (HOSPITAL_COMMUNITY)
Admission: RE | Admit: 2022-04-15 | Discharge: 2022-04-15 | Disposition: A | Discharge status: Home / Self Care | Payer: Medicaid Other | Source: Ambulatory Visit | Attending: Gynecologic Oncology | Admitting: Gynecologic Oncology

## 2022-04-15 ENCOUNTER — Encounter: Payer: Self-pay | Admitting: Obstetrics and Gynecology

## 2022-04-15 ENCOUNTER — Other Ambulatory Visit: Payer: Self-pay

## 2022-04-15 ENCOUNTER — Other Ambulatory Visit: Payer: Self-pay | Admitting: Gynecologic Oncology

## 2022-04-15 VITALS — BP 124/52 | HR 81 | Temp 98.3°F | Resp 14 | Ht 67.0 in | Wt 335.0 lb

## 2022-04-15 DIAGNOSIS — N939 Abnormal uterine and vaginal bleeding, unspecified: Secondary | ICD-10-CM

## 2022-04-15 DIAGNOSIS — C541 Malignant neoplasm of endometrium: Secondary | ICD-10-CM | POA: Diagnosis not present

## 2022-04-15 DIAGNOSIS — Z01812 Encounter for preprocedural laboratory examination: Secondary | ICD-10-CM | POA: Diagnosis not present

## 2022-04-15 DIAGNOSIS — R710 Precipitous drop in hematocrit: Secondary | ICD-10-CM

## 2022-04-15 HISTORY — DX: Essential (primary) hypertension: I10

## 2022-04-15 HISTORY — DX: Anemia, unspecified: D64.9

## 2022-04-15 HISTORY — DX: Pneumonia, unspecified organism: J18.9

## 2022-04-15 LAB — COMPREHENSIVE METABOLIC PANEL
ALT: 12 U/L (ref 0–44)
AST: 18 U/L (ref 15–41)
Albumin: 3.1 g/dL — ABNORMAL LOW (ref 3.5–5.0)
Alkaline Phosphatase: 56 U/L (ref 38–126)
Anion gap: 10 (ref 5–15)
BUN: 14 mg/dL (ref 6–20)
CO2: 24 mmol/L (ref 22–32)
Calcium: 8.4 mg/dL — ABNORMAL LOW (ref 8.9–10.3)
Chloride: 104 mmol/L (ref 98–111)
Creatinine, Ser: 0.8 mg/dL (ref 0.44–1.00)
GFR, Estimated: >60 mL/min (ref >60)
Glucose, Bld: 118 mg/dL — ABNORMAL HIGH (ref 70–99)
Potassium: 3.7 mmol/L (ref 3.5–5.1)
Sodium: 138 mmol/L (ref 135–145)
Total Bilirubin: 0.5 mg/dL (ref 0.3–1.2)
Total Protein: 7.1 g/dL (ref 6.5–8.1)

## 2022-04-15 LAB — CBC
HCT: 36.7 % (ref 36.0–46.0)
Hemoglobin: 12 g/dL (ref 12.0–15.0)
MCH: 29.4 pg (ref 26.0–34.0)
MCHC: 32.7 g/dL (ref 30.0–36.0)
MCV: 90 fL (ref 80.0–100.0)
Platelets: 408 10*3/uL — ABNORMAL HIGH (ref 150–400)
RBC: 4.08 MIL/uL (ref 3.87–5.11)
RDW: 13.3 % (ref 11.5–15.5)
WBC: 10.2 10*3/uL (ref 4.0–10.5)
nRBC: 0 % (ref 0.0–0.2)

## 2022-04-15 MED ORDER — FERROUS GLUCONATE 324 (38 FE) MG PO TABS: 324.0000 mg | ORAL_TABLET | Freq: Every day | ORAL | 0 refills | Status: DC

## 2022-04-15 MED ORDER — SENNOSIDES-DOCUSATE SODIUM 8.6-50 MG PO TABS: 2.0000 | ORAL_TABLET | Freq: Every day | ORAL | 0 refills | Status: DC

## 2022-04-15 NOTE — Patient Instructions (Addendum)
SURGICAL WAITING ROOM VISITATION  Patients having surgery or a procedure may have no more than 2 support people in the waiting area - these visitors may rotate.    Children under the age of 23 must have an adult with them who is not the patient.  Due to an increase in RSV and influenza rates and associated hospitalizations, children ages 87 and under may not visit patients in [place].  If the patient needs to stay at the hospital during part of their recovery, the visitor guidelines for inpatient rooms apply. Pre-op nurse will coordinate an appropriate time for 1 support person to accompany patient in pre-op.  This support person may not rotate.    Please refer to the [facility] website for the visitor guidelines for Inpatients (after your surgery is over and you are in a regular room).       Your procedure is scheduled on: 04-23-22    Report to [facility] Main Entrance    Report to admitting at     [facility]   AM   Call this number if you have problems the morning of surgery [phone]  Eat a light diet the day before surgery.  Examples including soups, broths, toast, yogurt, mashed potatoes.  Things to avoid include carbonated beverages (fizzy beverages), raw fruits and raw vegetables, or beans.   If your bowels are filled with gas, your surgeon will have difficulty visualizing your pelvic organs which increases your surgical risks.    Do not eat food :After Midnight.   After Midnight you may have the following liquids until   0430 ______ AM DAY OF SURGERY   then nothing by mouth  Water Non-Citrus Juices (without pulp, NO RED-Apple, White grape, White cranberry) Black Coffee (NO MILK/CREAM OR CREAMERS, sugar ok)  Clear Tea (NO MILK/CREAM OR CREAMERS, sugar ok) regular and decaf                             Plain Jell-O (NO RED)                                           Fruit ices (not with fruit pulp, NO RED)                                     Popsicles (NO  RED)                                                               Sports drinks like Gatorade (NO RED)                            If you have questions, please contact your surgeon's office.   FOLLOW ANY ADDITIONAL PRE OP INSTRUCTIONS YOU RECEIVED FROM YOUR SURGEON'S OFFICE!!!     Oral Hygiene is also important to reduce your risk of infection.  Remember - BRUSH YOUR TEETH THE MORNING OF SURGERY WITH YOUR REGULAR TOOTHPASTE  DENTURES WILL BE REMOVED PRIOR TO SURGERY PLEASE DO NOT APPLY "Poly grip" OR ADHESIVES!!!   Do NOT smoke after Midnight   Take these medicines the morning of surgery with A SIP OF WATER: Klonopin if needed, solifenacia (vesicare)                               You may not have any metal on your body including hair pins, jewelry, and body piercing             Do not wear make-up, lotions, powders, perfumes/cologne, or deodorant  Do not wear nail polish including gel and S&S, artificial/acrylic nails, or any other type of covering on natural nails including finger and toenails. If you have artificial nails, gel coating, etc. that needs to be removed by a nail salon please have this removed prior to surgery or surgery may need to be canceled/ delayed if the surgeon/ anesthesia feels like they are unable to be safely monitored.   Do not shave  48 hours prior to surgery.               Do not bring valuables to the hospital. [place].   Contacts, glasses, dentures or bridgework may not be worn into surgery.   Bring small overnight bag day of surgery.   DO NOT [place]. PHARMACY WILL DISPENSE MEDICATIONS LISTED ON YOUR MEDICATION LIST TO YOU DURING YOUR ADMISSION [facility]!    Patients discharged on the day of surgery will not be allowed to drive home.  Someone NEEDS to stay with you for the first 24 hours after anesthesia.   Special  Instructions: Bring a copy of your healthcare power of attorney and living will documents the day of surgery if you haven't scanned them before.              Please read over the following fact sheets you were given: IF [place] [phone]   If you received a COVID test during your pre-op visit  it is requested that you wear a mask when out in public, stay away from anyone that may not be feeling well and notify your surgeon if you develop symptoms. If you test positive for Covid or have been in contact with anyone that has tested positive in the last 10 days please notify you surgeon.    [facility] - Preparing for Surgery Before surgery, you can play an important role.  Because skin is not sterile, your skin needs to be as free of germs as possible.  You can reduce the number of germs on your skin by washing with CHG (chlorahexidine gluconate) soap before surgery.  CHG is an antiseptic cleaner which kills germs and bonds with the skin to continue killing germs even after washing. Please DO NOT use if you have an allergy to CHG or antibacterial soaps.  If your skin becomes reddened/irritated stop using the CHG and inform your nurse when you arrive at Short Stay. Do not shave (including legs and underarms) for at least 48 hours prior to the first CHG shower.  You may shave your face/neck. Please follow these instructions carefully:  1.  Shower with  CHG Soap the night before surgery and the  morning of Surgery.  2.  If you choose to wash your hair, wash your hair first as usual with your  normal  shampoo.  3.  After you shampoo, rinse your hair and body thoroughly to remove the  shampoo.                           4.  Use CHG as you would any other liquid soap.  You can apply chg directly  to the skin and wash                       Gently with a scrungie or clean washcloth.  5.  Apply the CHG Soap to your body ONLY FROM THE NECK DOWN.   Do not use  on face/ open                           Wound or open sores. Avoid contact with eyes, ears mouth and genitals (private parts).                       Wash face,  Genitals (private parts) with your normal soap.             6.  Wash thoroughly, paying special attention to the area where your surgery  will be performed.  7.  Thoroughly rinse your body with warm water from the neck down.  8.  DO NOT shower/wash with your normal soap after using and rinsing off  the CHG Soap.                9.  Pat yourself dry with a clean towel.            10.  Wear clean pajamas.            11.  Place clean sheets on your bed the night of your first shower and do not  sleep with pets. Day of Surgery : Do not apply any lotions/deodorants the morning of surgery.  Please wear clean clothes to the hospital/surgery center.  FAILURE TO FOLLOW THESE INSTRUCTIONS MAY RESULT IN THE CANCELLATION OF YOUR SURGERY PATIENT SIGNATURE_________________________________  NURSE SIGNATURE__________________________________  ________________________________________________________________________ WHAT IS A BLOOD TRANSFUSION? Blood Transfusion Information  A transfusion is the replacement of blood or some of its parts. Blood is made up of multiple cells which provide different functions. Red blood cells carry oxygen and are used for blood loss replacement. White blood cells fight against infection. Platelets control bleeding. Plasma helps clot blood. Other blood products are available for specialized needs, such as hemophilia or other clotting disorders. BEFORE THE TRANSFUSION  Who gives blood for transfusions?  Healthy volunteers who are fully evaluated to make sure their blood is safe. This is blood bank blood. Transfusion therapy is the safest it has ever been in the practice of medicine. Before blood is taken from a donor, a complete history is taken to make sure that person has no history of diseases nor engages in risky  social behavior (examples are intravenous drug use or sexual activity with multiple partners). The donor's travel history is screened to minimize risk of transmitting infections, such as malaria. The donated blood is tested for signs of infectious diseases, such as HIV and hepatitis. The blood is then tested to be sure it is compatible with you in  order to minimize the chance of a transfusion reaction. If you or a relative donates blood, this is often done in anticipation of surgery and is not appropriate for emergency situations. It takes many days to process the donated blood. RISKS AND COMPLICATIONS Although transfusion therapy is very safe and saves many lives, the main dangers of transfusion include:  Getting an infectious disease. Developing a transfusion reaction. This is an allergic reaction to something in the blood you were given. Every precaution is taken to prevent this. The decision to have a blood transfusion has been considered carefully by your caregiver before blood is given. Blood is not given unless the benefits outweigh the risks. AFTER THE TRANSFUSION Right after receiving a blood transfusion, you will usually feel much better and more energetic. This is especially true if your red blood cells have gotten low (anemic). The transfusion raises the level of the red blood cells which carry oxygen, and this usually causes an energy increase. The nurse administering the transfusion will monitor you carefully for complications. HOME CARE INSTRUCTIONS  No special instructions are needed after a transfusion. You may find your energy is better. Speak with your caregiver about any limitations on activity for underlying diseases you may have. SEEK MEDICAL CARE IF:  Your condition is not improving after your transfusion. You develop redness or irritation at the intravenous (IV) site. SEEK IMMEDIATE MEDICAL CARE IF:  Any of the following symptoms occur over the next 12 hours: Shaking  chills. You have a temperature by mouth above 102 F (38.9 C), not controlled by medicine. Chest, back, or muscle pain. People around you feel you are not acting correctly or are confused. Shortness of breath or difficulty breathing. Dizziness and fainting. You get a rash or develop hives. You have a decrease in urine output. Your urine turns a dark color or changes to pink, red, or brown. Any of the following symptoms occur over the next 10 days: You have a temperature by mouth above 102 F (38.9 C), not controlled by medicine. Shortness of breath. Weakness after normal activity. The white part of the eye turns yellow (jaundice). You have a decrease in the amount of urine or are urinating less often. Your urine turns a dark color or changes to pink, red, or brown. Document Released: 02/08/2000 Document Revised: 05/05/2011 Document Reviewed: 09/27/2007 [facility] Patient Information 2014 [place], [place].  _______________________________________________________________________

## 2022-04-15 NOTE — Patient Instructions (Signed)
Preparing for your Surgery  Plan for surgery on April 23, 2022 with Dr. Jeral Pinch at [facility] will be scheduled for robotic assisted laparoscopic total hysterectomy (removal of uterus and cervix), bilateral salpingo-oophorectomy (removal of both ovaries and fallopian tubes), sentinel lymph node evaluation, possible lymph node dissection, possible laparotomy (larger incision if needed), possible D&C and Mirena IUD placement (dilation and curettage of the uterus if unable to tolerate positioning for robotic surgery).   Pre-operative Testing -You will receive a phone call from presurgical testing at [facility] to arrange for a pre-operative appointment and lab work.  -Bring your insurance card, copy of an advanced directive if applicable, medication list  -At that visit, you will be asked to sign a consent for a possible blood transfusion in case a transfusion becomes necessary during surgery.  The need for a blood transfusion is rare but having consent is a necessary part of your care.     -You should not be taking blood thinners or aspirin at least ten days prior to surgery unless instructed by your surgeon.  -Do not take supplements such as fish oil (omega 3), red yeast rice, turmeric before your surgery. You want to avoid medications with aspirin in them including headache powders such as BC or Goody's), Excedrin migraine.  Day Before Surgery at [employer] will be asked to take in a light diet the day before surgery. You will be advised you can have clear liquids up until 3 hours before your surgery.    Eat a light diet the day before surgery.  Examples including soups, broths, toast, yogurt, mashed potatoes.  AVOID GAS PRODUCING FOODS AND BEVERAGES. Things to avoid include carbonated beverages (fizzy beverages, sodas), raw fruits and raw vegetables (uncooked), or beans.   If your bowels are filled with gas, your surgeon will have difficulty visualizing your  pelvic organs which increases your surgical risks.  Your role in recovery Your role is to become active as soon as directed by your doctor, while still giving yourself time to heal.  Rest when you feel tired. You will be asked to do the following in order to speed your recovery:  - Cough and breathe deeply. This helps to clear and expand your lungs and can prevent pneumonia after surgery.  - [place]. Do mild physical activity. Walking or moving your legs help your circulation and body functions return to normal. Do not try to get up or walk alone the first time after surgery.   -If you develop swelling on one leg or the other, pain in the back of your leg, redness/warmth in one of your legs, please call the office or go to the Emergency Room to have a doppler to rule out a blood clot. For shortness of breath, chest pain-seek care in the Emergency Room as soon as possible. - Actively manage your pain. Managing your pain lets you move in comfort. We will ask you to rate your pain on a scale of zero to 10. It is your responsibility to tell your doctor or nurse where and how much you hurt so your pain can be treated.  Special Considerations -If you are diabetic, you may be placed on insulin after surgery to have closer control over your blood sugars to promote healing and recovery.  This does not mean that you will be discharged on insulin.  If applicable, your oral antidiabetics will be resumed when you are tolerating a solid diet.  -  Your final pathology results from surgery should be available around one week after surgery and the results will be relayed to you when available.  -FMLA forms can be faxed to [phone] and please allow 5-7 business days for completion.  Pain Management After Surgery -We will reach out to Dr. Ammie Ferrier office about your upcoming surgery and need for additional pain medication.   -Make sure that you have Tylenol IF YOU ARE ABLE TO TAKE THESE  MEDICATION at home to use on a regular basis after surgery for pain control.   -Review the attached handout on narcotic use and their risks and side effects.   Bowel Regimen -You have been prescribed Sennakot-S to take nightly to prevent constipation especially if you are taking the narcotic pain medication intermittently.  It is important to prevent constipation and drink adequate amounts of liquids. You can stop taking this medication when you are not taking pain medication and you are back on your normal bowel routine.  Risks of Surgery Risks of surgery are low but include bleeding, infection, damage to surrounding structures, re-operation, blood clots, and very rarely death.   Blood Transfusion Information (For the consent to be signed before surgery)  We will be checking your blood type before surgery so in case of emergencies, we will know what type of blood you would need.                                            WHAT IS A BLOOD TRANSFUSION?  A transfusion is the replacement of blood or some of its parts. Blood is made up of multiple cells which provide different functions. Red blood cells carry oxygen and are used for blood loss replacement. White blood cells fight against infection. Platelets control bleeding. Plasma helps clot blood. Other blood products are available for specialized needs, such as hemophilia or other clotting disorders. BEFORE THE TRANSFUSION  Who gives blood for transfusions?  You may be able to donate blood to be used at a later date on yourself (autologous donation). Relatives can be asked to donate blood. This is generally not any safer than if you have received blood from a stranger. The same precautions are taken to ensure safety when a relative's blood is donated. Healthy volunteers who are fully evaluated to make sure their blood is safe. This is blood bank blood. Transfusion therapy is the safest it has ever been in the practice of medicine. Before  blood is taken from a donor, a complete history is taken to make sure that person has no history of diseases nor engages in risky social behavior (examples are intravenous drug use or sexual activity with multiple partners). The donor's travel history is screened to minimize risk of transmitting infections, such as malaria. The donated blood is tested for signs of infectious diseases, such as HIV and hepatitis. The blood is then tested to be sure it is compatible with you in order to minimize the chance of a transfusion reaction. If you or a relative donates blood, this is often done in anticipation of surgery and is not appropriate for emergency situations. It takes many days to process the donated blood. RISKS AND COMPLICATIONS Although transfusion therapy is very safe and saves many lives, the main dangers of transfusion include:  Getting an infectious disease. Developing a transfusion reaction. This is an allergic reaction to something in the blood  you were given. Every precaution is taken to prevent this. The decision to have a blood transfusion has been considered carefully by your caregiver before blood is given. Blood is not given unless the benefits outweigh the risks.  AFTER SURGERY INSTRUCTIONS  Return to work: 4-6 weeks if applicable  Activity: 1. Be up and out of the bed during the day.  Take a nap if needed.  You may walk up steps but be careful and use the hand rail.  Stair climbing will tire you more than you think, you may need to stop part way and rest.   2. No lifting or straining for 6 weeks over 10 pounds. No pushing, pulling, straining for 6 weeks.  3. No driving for around 1 week(s).  Do not drive if you are taking narcotic pain medicine and make sure that your reaction time has returned.   4. You can shower as soon as the next day after surgery. Shower daily.  Use your regular soap and water (not directly on the incision) and pat your incision(s) dry afterwards; don't rub.   No tub baths or submerging your body in water until cleared by your surgeon. If you have the soap that was given to you by pre-surgical testing that was used before surgery, you do not need to use it afterwards because this can irritate your incisions.   5. No sexual activity and nothing in the vagina for 10-12 weeks.  6. You may experience a small amount of clear drainage from your incisions, which is normal.  If the drainage persists, increases, or changes color please call the office.  7. Do not use creams, lotions, or ointments such as neosporin on your incisions after surgery until advised by your surgeon because they can cause removal of the dermabond glue on your incisions.    8. You may experience vaginal spotting after surgery or around the 6-8 week mark from surgery when the stitches at the top of the vagina begin to dissolve.  The spotting is normal but if you experience heavy bleeding, call our office.  9. Take Tylenol first for pain if you are able to take these medications and only use narcotic pain medication for severe pain not relieved by the Tylenol.  Monitor your Tylenol intake to a max of 4,000 mg in a 24 hour period.   Diet: 1. Low sodium Heart Healthy Diet is recommended but you are cleared to resume your normal (before surgery) diet after your procedure.  2. It is safe to use a laxative, such as Miralax or Colace, if you have difficulty moving your bowels. You have been prescribed Sennakot-S to take at bedtime every evening after surgery to keep bowel movements regular and to prevent constipation.    Wound Care: 1. Keep clean and dry.  Shower daily.  Reasons to call the Doctor: Fever - Oral temperature greater than 100.4 degrees Fahrenheit Foul-smelling vaginal discharge Difficulty urinating Nausea and vomiting Increased pain at the site of the incision that is unrelieved with pain medicine. Difficulty breathing with or without chest pain New calf pain especially if  only on one side Sudden, continuing increased vaginal bleeding with or without clots.   Contacts: For questions or concerns you should contact:  Dr. Jeral Pinch at [phone]  Joylene John, NP at [phone]  After Hours: call [phone] and have the GYN Oncologist paged/contacted (after 5 pm or on the weekends). You will speak with an after hours RN and let he or she know  you have had surgery.  Messages sent via mychart are for non-urgent matters and are not responded to after hours so for urgent needs, please call the after hours number.

## 2022-04-15 NOTE — Progress Notes (Signed)
Patient here with her son for a pre-operative appointment prior to her scheduled surgery on April 23, 2022. She is scheduled for robotic assisted laparoscopic total hysterectomy, bilateral salpingo-oophorectomy, sentinel lymph node evaluation, possible lymph node dissection, possible laparotomy, possible D&C and Mirena IUD placement. She has her pre-admission testing appointment later today at [facility].  The surgery was discussed in detail.  See after visit summary for additional details. Visual aids used to discuss items related to surgery including the incentive spirometer, sequential compression stockings, foley catheter, IV pump, multi-modal pain regimen including tylenol, photo of the surgical robot, female reproductive system to discuss surgery in detail.      Discussed post-op pain management in detail including the aspects of the enhanced recovery pathway.  Our office has reached out to Dr. Hubbard Robinson with [facility] about pain management post-operatively. We discussed the use of tylenol post-op and to monitor for a maximum of 4,000 mg in a 24 hour period.  Also prescribed sennakot to be used after surgery and to hold if having loose stools.  Discussed bowel regimen in detail.     Discussed the use of SCDs and measures to take at home to prevent DVT including frequent mobility.  Reportable signs and symptoms of DVT discussed. Post-operative instructions discussed and expectations for after surgery. Incisional care discussed as well including reportable signs and symptoms including erythema, drainage, wound separation.   The patient states she does not want to proceed with the Urogyn procedure due to the potential risk for needing to self cath. She states she sent Dr. Wannetta Sender a message this am. She has chronic right leg edema and reports having a recent doppler to rule out DVT.    10 minutes spent with the patient.  Verbalizing understanding of material discussed. No needs or  concerns voiced at the end of the visit.   Advised patient to call for any needs.  Advised that her post-operative bowel medication had been prescribed and could be picked up at any time.    This appointment is included in the global surgical bundle as pre-operative teaching and has no charge.

## 2022-04-16 ENCOUNTER — Telehealth: Payer: Self-pay

## 2022-04-16 NOTE — Telephone Encounter (Signed)
Per Joylene John, NP pt is aware of albumin level from recent labs is low (3.1). She is aware, at the advise of [facility], she can increase protein in take and could start drinking 2-3 protein shakes in between meals.  She voiced an understanding.

## 2022-04-21 ENCOUNTER — Telehealth: Payer: Self-pay

## 2022-04-21 NOTE — Telephone Encounter (Signed)
Per Joylene John NP, I spoke to Dr. Francis Dowse nurse, [facility], She states pt has a pain contract (not signed) with them and once the pain medication is sent in, the contract is in effect. She states Ms. Gislason will be receiving Hydrocodone long term and she has to come there for the pain med for upcoming surgery on (04/23/22).  Ms.Menz was seen there on Friday 2/23. Notes from that appointment will be faxed to [place].  Joylene John NP and Dr. Berline Lopes notified

## 2022-04-22 ENCOUNTER — Telehealth: Payer: Self-pay

## 2022-04-22 NOTE — Telephone Encounter (Signed)
I just spoke with the team lead at [facility]. The provider who manages her pain contract is out today. She missed her appt yesterday unfortunately. They are sending in a bridge prescription for her anxiety medication. I asked that, if they would like to manage all narcotics given her pain contract, there be consideration for a short term increase in her narcotic prescription given anticipated post-op pain. Team lead will review with provider there today and will call me back.

## 2022-04-22 NOTE — Anesthesia Preprocedure Evaluation (Addendum)
Anesthesia Evaluation  Patient identified by MRN, date of birth, ID band Patient awake    Reviewed: Allergy & Precautions, NPO status , Patient's Chart, lab work & pertinent test results  History of Anesthesia Complications Negative for: history of anesthetic complications  Airway Mallampati: II  TM Distance: >3 FB Neck ROM: Full    Dental  (+) Loose,    Pulmonary neg pulmonary ROS   Pulmonary exam normal        Cardiovascular hypertension, Pt. on medications + DOE  Normal cardiovascular exam     Neuro/Psych  Headaches  Anxiety Depression     negative psych ROS   GI/Hepatic Neg liver ROS,GERD  Medicated,,  Endo/Other    Morbid obesity  Renal/GU negative Renal ROS  negative genitourinary   Musculoskeletal  (+) Arthritis ,  Fibromyalgia -  Abdominal   Peds  Hematology negative hematology ROS (+)   Anesthesia Other Findings Day of surgery medications reviewed with patient.  Reproductive/Obstetrics negative OB ROS                             Anesthesia Physical Anesthesia Plan  ASA: 3  Anesthesia Plan: General   Post-op Pain Management: Tylenol PO (pre-op)* and Ketamine IV*   Induction: Intravenous  PONV Risk Score and Plan: 4 or greater and Midazolam, Scopolamine patch - Pre-op, Treatment may vary due to age or medical condition, Dexamethasone, Ondansetron and Propofol infusion  Airway Management Planned: Oral ETT  Additional Equipment: None  Intra-op Plan:   Post-operative Plan: Extubation in OR  Informed Consent: I have reviewed the patients History and Physical, chart, labs and discussed the procedure including the risks, benefits and alternatives for the proposed anesthesia with the patient or authorized representative who has indicated his/her understanding and acceptance.     Dental advisory given  Plan Discussed with: CRNA  Anesthesia Plan Comments:         Anesthesia Quick Evaluation

## 2022-04-22 NOTE — Telephone Encounter (Signed)
Telephone call to check on pre-operative status.  Patient compliant with pre-operative instructions.  Reinforced nothing to eat after midnight. Clear liquids until 0430. Patient to arrive at  0515.  No questions or concerns voiced.  Instructed to call for any needs. 

## 2022-04-23 ENCOUNTER — Telehealth: Payer: Self-pay | Admitting: Oncology

## 2022-04-23 ENCOUNTER — Ambulatory Visit (HOSPITAL_COMMUNITY): Payer: Medicaid Other | Admitting: Anesthesiology

## 2022-04-23 ENCOUNTER — Ambulatory Visit (HOSPITAL_COMMUNITY)
Admission: RE | Admit: 2022-04-23 | Discharge: 2022-04-25 | Disposition: A | Discharge status: Home / Self Care | Payer: Medicaid Other | Source: Ambulatory Visit | Attending: Gynecologic Oncology | Admitting: Gynecologic Oncology

## 2022-04-23 ENCOUNTER — Encounter (HOSPITAL_COMMUNITY)
Admission: RE | Disposition: A | Discharge status: Home / Self Care | Payer: Self-pay | Source: Ambulatory Visit | Attending: Gynecologic Oncology

## 2022-04-23 ENCOUNTER — Other Ambulatory Visit: Payer: Self-pay

## 2022-04-23 ENCOUNTER — Encounter (HOSPITAL_COMMUNITY): Payer: Self-pay | Admitting: Gynecologic Oncology

## 2022-04-23 ENCOUNTER — Ambulatory Visit (HOSPITAL_BASED_OUTPATIENT_CLINIC_OR_DEPARTMENT_OTHER): Payer: Medicaid Other | Admitting: Anesthesiology

## 2022-04-23 DIAGNOSIS — C541 Malignant neoplasm of endometrium: Secondary | ICD-10-CM | POA: Insufficient documentation

## 2022-04-23 DIAGNOSIS — K660 Peritoneal adhesions (postprocedural) (postinfection): Secondary | ICD-10-CM

## 2022-04-23 DIAGNOSIS — F418 Other specified anxiety disorders: Secondary | ICD-10-CM

## 2022-04-23 DIAGNOSIS — Z6841 Body Mass Index (BMI) 40.0 and over, adult: Secondary | ICD-10-CM

## 2022-04-23 DIAGNOSIS — K219 Gastro-esophageal reflux disease without esophagitis: Secondary | ICD-10-CM | POA: Insufficient documentation

## 2022-04-23 DIAGNOSIS — I10 Essential (primary) hypertension: Secondary | ICD-10-CM | POA: Diagnosis not present

## 2022-04-23 DIAGNOSIS — R34 Anuria and oliguria: Secondary | ICD-10-CM

## 2022-04-23 DIAGNOSIS — M797 Fibromyalgia: Secondary | ICD-10-CM | POA: Insufficient documentation

## 2022-04-23 DIAGNOSIS — D251 Intramural leiomyoma of uterus: Secondary | ICD-10-CM | POA: Diagnosis not present

## 2022-04-23 DIAGNOSIS — R000 Tachycardia, unspecified: Secondary | ICD-10-CM

## 2022-04-23 HISTORY — PX: SENTINEL NODE BIOPSY: SHX6608

## 2022-04-23 HISTORY — PX: ROBOTIC ASSISTED TOTAL HYSTERECTOMY WITH BILATERAL SALPINGO OOPHERECTOMY: SHX6086

## 2022-04-23 LAB — CBC
HCT: 36.6 % (ref 36.0–46.0)
Hemoglobin: 11.9 g/dL — ABNORMAL LOW (ref 12.0–15.0)
MCH: 29.6 pg (ref 26.0–34.0)
MCHC: 32.5 g/dL (ref 30.0–36.0)
MCV: 91 fL (ref 80.0–100.0)
Platelets: 409 10*3/uL — ABNORMAL HIGH (ref 150–400)
RBC: 4.02 MIL/uL (ref 3.87–5.11)
RDW: 13.4 % (ref 11.5–15.5)
WBC: 14.7 10*3/uL — ABNORMAL HIGH (ref 4.0–10.5)
nRBC: 0 % (ref 0.0–0.2)

## 2022-04-23 LAB — TYPE AND SCREEN
ABO/RH(D): O POS
Antibody Screen: NEGATIVE

## 2022-04-23 LAB — POCT PREGNANCY, URINE: Preg Test, Ur: NEGATIVE

## 2022-04-23 LAB — ABO/RH: ABO/RH(D): O POS

## 2022-04-23 SURGERY — HYSTERECTOMY, TOTAL, ROBOT-ASSISTED, LAPAROSCOPIC, WITH BILATERAL SALPINGO-OOPHORECTOMY
Anesthesia: General

## 2022-04-23 MED ORDER — HYDROMORPHONE HCL 1 MG/ML IJ SOLN: 0.2000 mg | INTRAMUSCULAR | Status: DC | PRN

## 2022-04-23 MED ORDER — ONDANSETRON HCL 4 MG/2ML IJ SOLN
INTRAMUSCULAR | Status: DC | PRN
  Administered 2022-04-23: 4 mg via INTRAVENOUS

## 2022-04-23 MED ORDER — FESOTERODINE FUMARATE ER 4 MG PO TB24
4.0000 mg | ORAL_TABLET | Freq: Every day | ORAL | Status: DC
  Administered 2022-04-23 – 2022-04-25 (×3): 4 mg via ORAL
  Filled 2022-04-23 (×4): qty 1

## 2022-04-23 MED ORDER — PROPOFOL 10 MG/ML IV BOLUS: INTRAVENOUS | Status: DC | PRN

## 2022-04-23 MED ORDER — DROPERIDOL 2.5 MG/ML IJ SOLN: 0.6250 mg | Freq: Once | INTRAMUSCULAR | Status: DC | PRN

## 2022-04-23 MED ORDER — SCOPOLAMINE 1 MG/3DAYS TD PT72
1.0000 | MEDICATED_PATCH | TRANSDERMAL | Status: DC
  Administered 2022-04-23: 1.5 mg via TRANSDERMAL
  Filled 2022-04-23: qty 1

## 2022-04-23 MED ORDER — STERILE WATER FOR INJECTION IJ SOLN
INTRAMUSCULAR | Status: AC
  Filled 2022-04-23: qty 10

## 2022-04-23 MED ORDER — CLONAZEPAM 1 MG PO TABS
1.0000 mg | ORAL_TABLET | Freq: Two times a day (BID) | ORAL | Status: DC
  Administered 2022-04-23 – 2022-04-25 (×5): 1 mg via ORAL
  Filled 2022-04-23 (×5): qty 1

## 2022-04-23 MED ORDER — STERILE WATER FOR INJECTION IJ SOLN
INTRAMUSCULAR | Status: DC | PRN
  Administered 2022-04-23: 4 mL

## 2022-04-23 MED ORDER — ONDANSETRON HCL 4 MG/2ML IJ SOLN
INTRAMUSCULAR | Status: AC
  Filled 2022-04-23: qty 2

## 2022-04-23 MED ORDER — ONDANSETRON HCL 4 MG/2ML IJ SOLN
4.0000 mg | Freq: Four times a day (QID) | INTRAMUSCULAR | Status: DC | PRN
  Administered 2022-04-23: 4 mg via INTRAVENOUS
  Filled 2022-04-23: qty 2

## 2022-04-23 MED ORDER — LACTATED RINGERS IV SOLN: INTRAVENOUS | Status: DC | PRN

## 2022-04-23 MED ORDER — FENTANYL CITRATE PF 50 MCG/ML IJ SOSY
25.0000 ug | PREFILLED_SYRINGE | INTRAMUSCULAR | Status: DC | PRN
  Administered 2022-04-23: 50 ug via INTRAVENOUS

## 2022-04-23 MED ORDER — TRAMADOL HCL 50 MG PO TABS
50.0000 mg | ORAL_TABLET | Freq: Four times a day (QID) | ORAL | Status: DC | PRN
  Administered 2022-04-23 – 2022-04-25 (×2): 50 mg via ORAL
  Filled 2022-04-23 (×2): qty 1

## 2022-04-23 MED ORDER — SUGAMMADEX SODIUM 200 MG/2ML IV SOLN
INTRAVENOUS | Status: DC | PRN
  Administered 2022-04-23: 200 mg via INTRAVENOUS

## 2022-04-23 MED ORDER — HEPARIN SODIUM (PORCINE) 5000 UNIT/ML IJ SOLN
5000.0000 [IU] | INTRAMUSCULAR | Status: AC
  Administered 2022-04-23: 5000 [IU] via SUBCUTANEOUS
  Filled 2022-04-23: qty 1

## 2022-04-23 MED ORDER — ROCURONIUM BROMIDE 10 MG/ML (PF) SYRINGE
PREFILLED_SYRINGE | INTRAVENOUS | Status: DC | PRN
  Administered 2022-04-23: 70 mg via INTRAVENOUS
  Administered 2022-04-23: 20 mg via INTRAVENOUS
  Administered 2022-04-23: 30 mg via INTRAVENOUS

## 2022-04-23 MED ORDER — LEVONORGESTREL 20 MCG/DAY IU IUD
1.0000 | INTRAUTERINE_SYSTEM | INTRAUTERINE | Status: DC
  Filled 2022-04-23: qty 1

## 2022-04-23 MED ORDER — BUPIVACAINE HCL 0.25 % IJ SOLN
INTRAMUSCULAR | Status: AC
  Filled 2022-04-23: qty 1

## 2022-04-23 MED ORDER — LIDOCAINE 2% (20 MG/ML) 5 ML SYRINGE
INTRAMUSCULAR | Status: DC | PRN
  Administered 2022-04-23: 100 mg via INTRAVENOUS

## 2022-04-23 MED ORDER — BUPIVACAINE HCL 0.25 % IJ SOLN
INTRAMUSCULAR | Status: DC | PRN
  Administered 2022-04-23: 32 mL

## 2022-04-23 MED ORDER — ONDANSETRON HCL 4 MG PO TABS: 4.0000 mg | ORAL_TABLET | Freq: Four times a day (QID) | ORAL | Status: DC | PRN

## 2022-04-23 MED ORDER — LACTATED RINGERS IR SOLN
Status: DC | PRN
  Administered 2022-04-23: 1000 mL

## 2022-04-23 MED ORDER — FENTANYL CITRATE (PF) 100 MCG/2ML IJ SOLN
INTRAMUSCULAR | Status: AC
  Filled 2022-04-23: qty 2

## 2022-04-23 MED ORDER — ACETAMINOPHEN 500 MG PO TABS: 1000.0000 mg | ORAL_TABLET | Freq: Once | ORAL | Status: DC

## 2022-04-23 MED ORDER — LACTATED RINGERS IV SOLN: INTRAVENOUS | Status: DC

## 2022-04-23 MED ORDER — STERILE WATER FOR IRRIGATION IR SOLN
Status: DC | PRN
  Administered 2022-04-23: 1000 mL

## 2022-04-23 MED ORDER — PHENYLEPHRINE 80 MCG/ML (10ML) SYRINGE FOR IV PUSH (FOR BLOOD PRESSURE SUPPORT)
PREFILLED_SYRINGE | INTRAVENOUS | Status: DC | PRN
  Administered 2022-04-23: 160 ug via INTRAVENOUS

## 2022-04-23 MED ORDER — PROPOFOL 10 MG/ML IV BOLUS
INTRAVENOUS | Status: DC | PRN
  Administered 2022-04-23: 200 mg via INTRAVENOUS

## 2022-04-23 MED ORDER — GABAPENTIN 300 MG PO CAPS
300.0000 mg | ORAL_CAPSULE | ORAL | Status: AC
  Administered 2022-04-23: 300 mg via ORAL
  Filled 2022-04-23: qty 1

## 2022-04-23 MED ORDER — ACETAMINOPHEN 500 MG PO TABS
1000.0000 mg | ORAL_TABLET | ORAL | Status: AC
  Administered 2022-04-23: 1000 mg via ORAL
  Filled 2022-04-23: qty 2

## 2022-04-23 MED ORDER — ORAL CARE MOUTH RINSE: 15.0000 mL | Freq: Once | OROMUCOSAL | Status: AC

## 2022-04-23 MED ORDER — CHLORHEXIDINE GLUCONATE 0.12 % MT SOLN
15.0000 mL | Freq: Once | OROMUCOSAL | Status: AC
  Administered 2022-04-23: 15 mL via OROMUCOSAL

## 2022-04-23 MED ORDER — DEXAMETHASONE SODIUM PHOSPHATE 10 MG/ML IJ SOLN
INTRAMUSCULAR | Status: AC
  Filled 2022-04-23: qty 1

## 2022-04-23 MED ORDER — FERROUS GLUCONATE 324 (38 FE) MG PO TABS
324.0000 mg | ORAL_TABLET | Freq: Every day | ORAL | Status: DC
  Administered 2022-04-24 – 2022-04-25 (×2): 324 mg via ORAL
  Filled 2022-04-23 (×2): qty 1

## 2022-04-23 MED ORDER — SIMETHICONE 80 MG PO CHEW
80.0000 mg | CHEWABLE_TABLET | Freq: Four times a day (QID) | ORAL | Status: DC | PRN
  Administered 2022-04-25: 80 mg via ORAL

## 2022-04-23 MED ORDER — ENOXAPARIN SODIUM 40 MG/0.4ML IJ SOSY
40.0000 mg | PREFILLED_SYRINGE | INTRAMUSCULAR | Status: DC
  Administered 2022-04-24 (×2): 40 mg via SUBCUTANEOUS
  Filled 2022-04-23: qty 0.4

## 2022-04-23 MED ORDER — TRAMADOL HCL 50 MG PO TABS
ORAL_TABLET | ORAL | Status: AC
  Filled 2022-04-23: qty 1

## 2022-04-23 MED ORDER — SCOPOLAMINE 1 MG/3DAYS TD PT72: 1.0000 | MEDICATED_PATCH | Freq: Once | TRANSDERMAL | Status: DC

## 2022-04-23 MED ORDER — DEXAMETHASONE SODIUM PHOSPHATE 10 MG/ML IJ SOLN
INTRAMUSCULAR | Status: DC | PRN
  Administered 2022-04-23: 10 mg via INTRAVENOUS

## 2022-04-23 MED ORDER — PROPOFOL 10 MG/ML IV BOLUS
INTRAVENOUS | Status: AC
  Filled 2022-04-23: qty 20

## 2022-04-23 MED ORDER — HYDROCODONE-ACETAMINOPHEN 10-325 MG PO TABS
1.0000 | ORAL_TABLET | ORAL | Status: DC | PRN
  Administered 2022-04-23 – 2022-04-25 (×7): 1 via ORAL
  Filled 2022-04-23 (×8): qty 1

## 2022-04-23 MED ORDER — DEXAMETHASONE SODIUM PHOSPHATE 4 MG/ML IJ SOLN: 4.0000 mg | INTRAMUSCULAR | Status: DC

## 2022-04-23 MED ORDER — KCL IN DEXTROSE-NACL 20-5-0.45 MEQ/L-%-% IV SOLN
INTRAVENOUS | Status: DC
  Filled 2022-04-23: qty 1000

## 2022-04-23 MED ORDER — LIDOCAINE HCL 2 % IJ SOLN
INTRAMUSCULAR | Status: AC
  Filled 2022-04-23: qty 40

## 2022-04-23 MED ORDER — MIDAZOLAM HCL 5 MG/5ML IJ SOLN
INTRAMUSCULAR | Status: DC | PRN
  Administered 2022-04-23: 2 mg via INTRAVENOUS

## 2022-04-23 MED ORDER — PHENYLEPHRINE HCL-NACL 20-0.9 MG/250ML-% IV SOLN
INTRAVENOUS | Status: AC
  Filled 2022-04-23: qty 250

## 2022-04-23 MED ORDER — FENTANYL CITRATE PF 50 MCG/ML IJ SOSY
PREFILLED_SYRINGE | INTRAMUSCULAR | Status: AC
  Filled 2022-04-23: qty 3

## 2022-04-23 MED ORDER — KETAMINE HCL 50 MG/5ML IJ SOSY
PREFILLED_SYRINGE | INTRAMUSCULAR | Status: AC
  Filled 2022-04-23: qty 5

## 2022-04-23 MED ORDER — FENTANYL CITRATE (PF) 100 MCG/2ML IJ SOLN
INTRAMUSCULAR | Status: DC | PRN
  Administered 2022-04-23 (×4): 50 ug via INTRAVENOUS

## 2022-04-23 MED ORDER — CEFAZOLIN IN SODIUM CHLORIDE 3-0.9 GM/100ML-% IV SOLN
3.0000 g | INTRAVENOUS | Status: AC
  Administered 2022-04-23: 3 g via INTRAVENOUS
  Filled 2022-04-23: qty 100

## 2022-04-23 MED ORDER — STERILE WATER FOR INJECTION IJ SOLN
INTRAMUSCULAR | Status: DC | PRN
  Administered 2022-04-23: 10 mL

## 2022-04-23 MED ORDER — KETAMINE HCL 10 MG/ML IJ SOLN
INTRAMUSCULAR | Status: DC | PRN
  Administered 2022-04-23 (×2): 20 mg via INTRAVENOUS

## 2022-04-23 MED ORDER — LIDOCAINE HCL (PF) 2 % IJ SOLN
INTRAMUSCULAR | Status: DC | PRN
  Administered 2022-04-23: 1 mg/kg/h via INTRADERMAL

## 2022-04-23 MED ORDER — SENNOSIDES-DOCUSATE SODIUM 8.6-50 MG PO TABS
2.0000 | ORAL_TABLET | Freq: Every day | ORAL | Status: DC
  Administered 2022-04-23 – 2022-04-24 (×2): 2 via ORAL
  Filled 2022-04-23 (×2): qty 2

## 2022-04-23 MED ORDER — MIDAZOLAM HCL 2 MG/2ML IJ SOLN
INTRAMUSCULAR | Status: AC
  Filled 2022-04-23: qty 2

## 2022-04-23 MED ORDER — ROCURONIUM BROMIDE 10 MG/ML (PF) SYRINGE
PREFILLED_SYRINGE | INTRAVENOUS | Status: AC
  Filled 2022-04-23: qty 10

## 2022-04-23 SURGICAL SUPPLY — 101 items
ADH SKN CLS APL DERMABOND .7 (GAUZE/BANDAGES/DRESSINGS) ×2
AGENT HMST KT MTR STRL THRMB (HEMOSTASIS) ×1
APL ESCP 34 STRL LF DISP (HEMOSTASIS) ×1
APL SRG 38 LTWT LNG FL B (MISCELLANEOUS) ×1
APPLICATOR ARISTA FLEXITIP XL (MISCELLANEOUS) IMPLANT
APPLICATOR SURGIFLO ENDO (HEMOSTASIS) IMPLANT
BAG COUNTER SPONGE SURGICOUNT (BAG) IMPLANT
BAG LAPAROSCOPIC 12 15 PORT 16 (BASKET) IMPLANT
BAG RETRIEVAL 12/15 (BASKET) ×1
BAG SPNG CNTER NS LX DISP (BAG)
BLADE SURG SZ10 CARB STEEL (BLADE) IMPLANT
CATH ROBINSON RED A/P 16FR (CATHETERS) ×1 IMPLANT
COVER BACK TABLE 60X90IN (DRAPES) ×1 IMPLANT
COVER TIP SHEARS 8 DVNC (MISCELLANEOUS) ×1 IMPLANT
COVER TIP SHEARS 8MM DA VINCI (MISCELLANEOUS) ×1
DERMABOND ADVANCED .7 DNX12 (GAUZE/BANDAGES/DRESSINGS) ×1 IMPLANT
DRAPE ARM DVNC X/XI (DISPOSABLE) ×4 IMPLANT
DRAPE COLUMN DVNC XI (DISPOSABLE) ×1 IMPLANT
DRAPE DA VINCI XI ARM (DISPOSABLE) ×4
DRAPE DA VINCI XI COLUMN (DISPOSABLE) ×1
DRAPE SHEET LG 3/4 BI-LAMINATE (DRAPES) ×1 IMPLANT
DRAPE SURG IRRIG POUCH 19X23 (DRAPES) ×1 IMPLANT
DRAPE UNDERBUTTOCKS STRL (DISPOSABLE) ×1 IMPLANT
DRSG OPSITE POSTOP 4X6 (GAUZE/BANDAGES/DRESSINGS) IMPLANT
DRSG OPSITE POSTOP 4X8 (GAUZE/BANDAGES/DRESSINGS) IMPLANT
DRSG TELFA 3X8 NADH STRL (GAUZE/BANDAGES/DRESSINGS) ×1 IMPLANT
ELECT PENCIL ROCKER SW 15FT (MISCELLANEOUS) IMPLANT
ELECT REM PT RETURN 15FT ADLT (MISCELLANEOUS) ×1 IMPLANT
GAUZE 4X4 16PLY ~~LOC~~+RFID DBL (SPONGE) ×2 IMPLANT
GLOVE BIO SURGEON STRL SZ 6 (GLOVE) ×4 IMPLANT
GLOVE BIO SURGEON STRL SZ 6.5 (GLOVE) ×1 IMPLANT
GOWN STRL REUS W/ TWL LRG LVL3 (GOWN DISPOSABLE) ×4 IMPLANT
GOWN STRL REUS W/TWL LRG LVL3 (GOWN DISPOSABLE) ×4
GRASPER SUT TROCAR 14GX15 (MISCELLANEOUS) IMPLANT
HEMOSTAT ARISTA ABSORB 3G PWDR (HEMOSTASIS) IMPLANT
HIBICLENS CHG 4% 4OZ BTL (MISCELLANEOUS) ×2 IMPLANT
HOLDER FOLEY CATH W/STRAP (MISCELLANEOUS) IMPLANT
IRRIG SUCT STRYKERFLOW 2 WTIP (MISCELLANEOUS) ×1
IRRIGATION SUCT STRKRFLW 2 WTP (MISCELLANEOUS) ×1 IMPLANT
KIT BASIN OR (CUSTOM PROCEDURE TRAY) ×1 IMPLANT
KIT PROCEDURE DA VINCI SI (MISCELLANEOUS)
KIT PROCEDURE DVNC SI (MISCELLANEOUS) IMPLANT
KIT TURNOVER KIT A (KITS) IMPLANT
LIGASURE IMPACT 36 18CM CVD LR (INSTRUMENTS) IMPLANT
MANIPULATOR ADVINCU DEL 3.0 PL (MISCELLANEOUS) IMPLANT
MANIPULATOR ADVINCU DEL 3.5 PL (MISCELLANEOUS) IMPLANT
MANIPULATOR UTERINE 4.5 ZUMI (MISCELLANEOUS) IMPLANT
NDL HYPO 21X1.5 SAFETY (NEEDLE) ×1 IMPLANT
NDL SPNL 18GX3.5 QUINCKE PK (NEEDLE) IMPLANT
NDL SPNL 22GX3.5 QUINCKE BK (NEEDLE) ×1 IMPLANT
NEEDLE HYPO 21X1.5 SAFETY (NEEDLE) ×1 IMPLANT
NEEDLE SPNL 18GX3.5 QUINCKE PK (NEEDLE) IMPLANT
NEEDLE SPNL 22GX3.5 QUINCKE BK (NEEDLE) ×1 IMPLANT
NS IRRIG 1000ML POUR BTL (IV SOLUTION) ×1 IMPLANT
OBTURATOR OPTICAL STANDARD 8MM (TROCAR) ×1
OBTURATOR OPTICAL STND 8 DVNC (TROCAR) ×1
OBTURATOR OPTICALSTD 8 DVNC (TROCAR) ×1 IMPLANT
PACK LITHOTOMY IV (CUSTOM PROCEDURE TRAY) ×1 IMPLANT
PACK ROBOT GYN CUSTOM WL (TRAY / TRAY PROCEDURE) ×1 IMPLANT
PACKING VAGINAL (PACKING) IMPLANT
PAD OB MATERNITY 4.3X12.25 (PERSONAL CARE ITEMS) ×1 IMPLANT
PAD POSITIONING PINK XL (MISCELLANEOUS) ×1 IMPLANT
PENCIL SMOKE EVACUATOR (MISCELLANEOUS) IMPLANT
PORT ACCESS TROCAR AIRSEAL 12 (TROCAR) IMPLANT
RETRACTOR LAPSCP 12X46 CVD (ENDOMECHANICALS) IMPLANT
RTRCTR LAPSCP 12X46 CVD (ENDOMECHANICALS) ×1
SEAL CANN UNIV 5-8 DVNC XI (MISCELLANEOUS) ×3 IMPLANT
SEAL XI 5MM-8MM UNIVERSAL (MISCELLANEOUS) ×3
SET TRI-LUMEN FLTR TB AIRSEAL (TUBING) ×1 IMPLANT
SOL PREP POV-IOD 4OZ 10% (MISCELLANEOUS) ×1 IMPLANT
SPIKE FLUID TRANSFER (MISCELLANEOUS) ×1 IMPLANT
SPONGE T-LAP 18X18 ~~LOC~~+RFID (SPONGE) IMPLANT
SURGIFLO W/THROMBIN 8M KIT (HEMOSTASIS) IMPLANT
SURGILUBE 2OZ TUBE FLIPTOP (MISCELLANEOUS) ×1 IMPLANT
SUT MNCRL AB 4-0 PS2 18 (SUTURE) IMPLANT
SUT PDS AB 1 TP1 96 (SUTURE) IMPLANT
SUT V-LOC 180 0-0 GS22 (SUTURE) IMPLANT
SUT VIC AB 0 CT1 27 (SUTURE)
SUT VIC AB 0 CT1 27XBRD ANTBC (SUTURE) IMPLANT
SUT VIC AB 2-0 CT1 27 (SUTURE)
SUT VIC AB 2-0 CT1 TAPERPNT 27 (SUTURE) IMPLANT
SUT VIC AB 2-0 SH 27 (SUTURE) ×1
SUT VIC AB 2-0 SH 27X BRD (SUTURE) IMPLANT
SUT VIC AB 4-0 PS2 18 (SUTURE) ×2 IMPLANT
SUT VICRYL 0 27 CT2 27 ABS (SUTURE) ×1 IMPLANT
SUT VLOC 180 0 9IN  GS21 (SUTURE) ×1
SUT VLOC 180 0 9IN GS21 (SUTURE) IMPLANT
SYR 10ML LL (SYRINGE) IMPLANT
SYR BULB IRRIG 60ML STRL (SYRINGE) IMPLANT
SYS BAG RETRIEVAL 10MM (BASKET)
SYS WOUND ALEXIS 18CM MED (MISCELLANEOUS)
SYSTEM BAG RETRIEVAL 10MM (BASKET) IMPLANT
SYSTEM WOUND ALEXIS 18CM MED (MISCELLANEOUS) IMPLANT
TOWEL OR 17X26 10 PK STRL BLUE (TOWEL DISPOSABLE) ×1 IMPLANT
TOWEL OR NON WOVEN STRL DISP B (DISPOSABLE) ×1 IMPLANT
TRAP SPECIMEN MUCUS 40CC (MISCELLANEOUS) IMPLANT
TRAY FOLEY MTR SLVR 16FR STAT (SET/KITS/TRAYS/PACK) ×1 IMPLANT
TROCAR PORT AIRSEAL 5X120 (TROCAR) IMPLANT
UNDERPAD 30X36 HEAVY ABSORB (UNDERPADS AND DIAPERS) ×2 IMPLANT
WATER STERILE IRR 1000ML POUR (IV SOLUTION) ×1 IMPLANT
YANKAUER SUCT BULB TIP 10FT TU (MISCELLANEOUS) IMPLANT
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
## 2022-04-23 NOTE — Discharge Instructions (Addendum)
YOU WILL NEED TO PUSH FLUIDS AND STAY HYDRATED. YOUR URINE SHOULD BE LIGHT YELLOW AND CLEAR.  WE WILL SEE YOU IN THE OFFICE ON MONDAY FOR FOLEY REMOVAL AND A VOIDING TRIAL.  AFTER SURGERY INSTRUCTIONS   Return to work: 4-6 weeks if applicable   Activity: 1. Be up and out of the bed during the day.  Take a nap if needed.  You may walk up steps but be careful and use the hand rail.  Stair climbing will tire you more than you think, you may need to stop part way and rest.    2. No lifting or straining for 6 weeks over 10 pounds. No pushing, pulling, straining for 6 weeks.   3. No driving for around 1 week(s).  Do not drive if you are taking narcotic pain medicine and make sure that your reaction time has returned.    4. You can shower as soon as the next day after surgery. Shower daily.  Use your regular soap and water (not directly on the incision) and pat your incision(s) dry afterwards; don't rub.  No tub baths or submerging your body in water until cleared by your surgeon. If you have the soap that was given to you by pre-surgical testing that was used before surgery, you do not need to use it afterwards because this can irritate your incisions.    5. No sexual activity and nothing in the vagina for 10-12 weeks.   6. You may experience a small amount of clear drainage from your incisions, which is normal.  If the drainage persists, increases, or changes color please call the office.   7. Do not use creams, lotions, or ointments such as neosporin on your incisions after surgery until advised by your surgeon because they can cause removal of the dermabond glue on your incisions.     8. You may experience vaginal spotting after surgery or around the 6-8 week mark from surgery when the stitches at the top of the vagina begin to dissolve.  The spotting is normal but if you experience heavy bleeding, call our office.   9. Take Tylenol first for pain if you are able to take these medications and  only use narcotic pain medication for severe pain not relieved by the Tylenol.  Monitor your Tylenol intake to a max of 4,000 mg in a 24 hour period.    Diet: 1. Low sodium Heart Healthy Diet is recommended but you are cleared to resume your normal (before surgery) diet after your procedure.   2. It is safe to use a laxative, such as Miralax or Colace, if you have difficulty moving your bowels. You have been prescribed Sennakot-S to take at bedtime every evening after surgery to keep bowel movements regular and to prevent constipation.     Wound Care: 1. Keep clean and dry.  Shower daily.   Reasons to call the Doctor: Fever - Oral temperature greater than 100.4 degrees Fahrenheit Foul-smelling vaginal discharge Difficulty urinating Nausea and vomiting Increased pain at the site of the incision that is unrelieved with pain medicine. Difficulty breathing with or without chest pain New calf pain especially if only on one side Sudden, continuing increased vaginal bleeding with or without clots.   Contacts: For questions or concerns you should contact:   Dr. Jeral Pinch at [phone]   Joylene John, NP at [phone]   After Hours: call [phone] and have the GYN Oncologist paged/contacted (after 5 pm or on the weekends). You will  speak with an after hours RN and let he or she know you have had surgery.   Messages sent via mychart are for non-urgent matters and are not responded to after hours so for urgent needs, please call the after hours number.

## 2022-04-23 NOTE — Anesthesia Postprocedure Evaluation (Signed)
Anesthesia Post Note  Patient: ROCHEL MOROYOQUI  Procedure(s) Performed: XI ROBOTIC ASSISTED TOTAL HYSTERECTOMY WITH BILATERAL SALPINGO OOPHORECTOMY with Cystoscopy, left external iliac sentinel lymph node dissection LYMPH NODE DISSECTION     Patient location during evaluation: PACU Anesthesia Type: General Level of consciousness: awake and alert Pain management: pain level controlled Vital Signs Assessment: post-procedure vital signs reviewed and stable Respiratory status: spontaneous breathing, nonlabored ventilation and respiratory function stable Cardiovascular status: blood pressure returned to baseline Postop Assessment: no apparent nausea or vomiting Anesthetic complications: no   No notable events documented.  Last Vitals:  Vitals:   04/23/22 1230 04/23/22 1327  BP: 128/76 (!) 101/57  Pulse: 80 78  Resp: 18 16  Temp:  (!) 36.3 C  SpO2: 99% 96%    Last Pain:  Vitals:   04/23/22 1327  TempSrc: Oral  PainSc:                  Marthenia Rolling

## 2022-04-23 NOTE — Brief Op Note (Signed)
04/23/2022  11:35 AM  PATIENT:  Donna [name]  57 y.o. female  PRE-OPERATIVE DIAGNOSIS:  ENDOMETRIAL CANCER  POST-OPERATIVE DIAGNOSIS:  ENDOMETRIAL CANCER  PROCEDURE:  Procedure(s): XI ROBOTIC ASSISTED TOTAL HYSTERECTOMY WITH BILATERAL SALPINGO OOPHORECTOMY with Cystoscopy, left external iliac sentinel lymph node dissection (N/A) LYMPH NODE DISSECTION (N/A)  SURGEON:  Surgeon(s) and Role:    Lafonda Mosses, MD - Primary  ASSISTANTS: Joylene John NP   ANESTHESIA:   general  EBL:  350 mL   BLOOD ADMINISTERED:none  DRAINS: none   LOCAL MEDICATIONS USED:  MARCAINE     SPECIMEN:  uterus, cervix, tubes and ovaries, left external iliac SLN  DISPOSITION OF SPECIMEN:  PATHOLOGY  COUNTS:  YES  TOURNIQUET:  * No tourniquets in log *  DICTATION: .Note written in EPIC  PLAN OF CARE: Admit for overnight observation  PATIENT DISPOSITION:  PACU - hemodynamically stable.   Delay start of Pharmacological VTE agent (>24hrs) due to surgical blood loss or risk of bleeding: not applicable

## 2022-04-23 NOTE — Anesthesia Procedure Notes (Signed)
Procedure Name: Intubation Date/Time: 04/23/2022 7:37 AM  Performed by: Gerald Leitz, CRNAPre-anesthesia Checklist: Patient identified, Patient being monitored, Timeout performed, Emergency Drugs available and Suction available Patient Re-evaluated:Patient Re-evaluated prior to induction Oxygen Delivery Method: Circle system utilized Preoxygenation: Pre-oxygenation with 100% oxygen Induction Type: IV induction Ventilation: Mask ventilation without difficulty Laryngoscope Size: Mac and 3 Grade View: Grade I Tube type: Oral Tube size: 8.0 mm Number of attempts: 1 Airway Equipment and Method: Stylet Placement Confirmation: ETT inserted through vocal cords under direct vision, positive ETCO2 and breath sounds checked- equal and bilateral Secured at: 22 cm Tube secured with: Tape Dental Injury: Teeth and Oropharynx as per pre-operative assessment

## 2022-04-23 NOTE — Telephone Encounter (Signed)
Called [employer] and spoke to Hubbard Robinson, PA-C's nurse [place].  Explained that Olisa had surgery this morning and will be staying overnight in the hospital.  Asked if we can give additional pain medication to cover her post-op pain needs or if are they going to address. Chesmond said Thurmond Butts will give her a breakthrough prescription since she has a pain contract but she needs to be seen in the office first.  She stated that Kiernan should have enough pain medication until she is able to be seen. Hubbard Robinson is out of the office today but will be back in tomorrow morning.

## 2022-04-23 NOTE — Op Note (Signed)
OPERATIVE NOTE  Pre-operative Diagnosis: endometrial cancer grade 1  Post-operative Diagnosis: same, intra-abdominal adhesions, significant intra-abdominal adiposity  Operation: Robotic-assisted laparoscopic total hysterectomy with bilateral salpingoophorectomy, SLN biopsy on the left, failed mapping on the right, cystoscopy, repair of large right sidewall vaginal laceration  Extreme morbid obesity requiring additional OR personnel for positioning and retraction. Obesity made retroperitoneal visualization limited and increased the complexity of the case and necessitated additional instrumentation for retraction. Obesity related complexity increased the duration of the procedure by 75 minutes.    Surgeon: Jeral Pinch MD  Assistant Surgeon: Joylene John NP  Anesthesia: GET  Urine Output: 100 cc  Operative Findings: On EUA, enlarged mobile uterus. Cervix somewhat flush with vaginal apex after recent excisional procedure. ON intra-abdominal entry, normal upper abdominal survey. Omentum with adhesions to the anterior abdominal wall. Adhesions of the cecum to the right abdominal sidewall just above the pelvic brim. Uterus 12 cm and bulbous at the fundus. Normal appearing bilateral adnexa. Significant adiposity of the retroperitoneum noted. No mapping on the right, mapping to left external iliac SLN on the left. Given difficulty with ventilation and adiposity, right lymph node dissection was deferred. Narrow pelvis noted. No obvious intra-abdominal or pelvic evidence of disease. Right vaginal sidewall laceration noted after transvaginal delivery of the uterus requiring repair and packing of the vagina. ON cystoscopy, bladder dome intact, good efflux from bilateral ureteral orifices.   Estimated Blood Loss:  350 cc      Total IV Fluids: see I&O flowsheet         Specimens: uterus, cervix, bilateral tubes and ovaries, left external iliac SLN         Complications:  None apparent; patient  tolerated the procedure well.         Disposition: PACU - hemodynamically stable.  Procedure Details  The patient was seen in the Holding Room. The risks, benefits, complications, treatment options, and expected outcomes were discussed with the patient.  The patient concurred with the proposed plan, giving informed consent.  The site of surgery properly noted/marked. The patient was identified as Donna [name] and the procedure verified as a Robotic-assisted hysterectomy with bilateral salpingo oophorectomy with SLN biopsy.   After induction of anesthesia, the patient was draped and prepped in the usual sterile manner. Patient was placed in supine position after anesthesia and draped and prepped in the usual sterile manner as follows: Her arms were tucked to her side with all appropriate precautions.  The patient was secured to the bed using padding and tape across her chest.  The patient was placed in the semi-lithotomy position in [place].  The perineum and vagina were prepped with Betadine. The patient's abdomen was prepped with ChloraPrep and she was draped after the prep had been allowed to dry for 3 minutes.  A Time Out was held and the above information confirmed.  The urethra was prepped with Betadine. Foley catheter was placed.  A sterile speculum was placed in the vagina.  The cervix was grasped with a single-tooth tenaculum. '2mg'$  total of ICG was injected into the cervical stroma at 2 and 9 o'clock with 1cc injected at a 1cm and 93m depth (concentration 0.'5mg'$ /ml) in all locations. The cervix was dilated with PKennon Roundsdilators.  The 3.5 Delineator uterine manipulator with a colpotomizer ring was placed without difficulty.  A pneum occluder balloon was placed over the manipulator.  OG tube placement was confirmed and to suction.   Next, a 10 mm skin incision was made 1 cm  below the subcostal margin in the midclavicular line.  The 5 mm Optiview port and scope was used for direct entry.  Opening  pressure was under 10 mm CO2.  The abdomen was insufflated and the findings were noted as above.   At this point and all points during the procedure, the patient's intra-abdominal pressure did not exceed 15 mmHg. Next, an 8 mm skin incision was made superior to the umbilicus and a right and left port were placed about 8 cm lateral to the robot port on the right and left side.  A fourth arm was placed on the right.  The 5 mm assist trocar was exchanged for a 10-12 mm port. A second 5 mm assist trocar was placed in the mid-left abdomen. All ports were placed under direct visualization.  The patient was placed in steep Trendelenburg.  Bowel was folded away into the upper abdomen.  The robot was docked in the normal manner.  Adhesions of the omentum were taken down with monopolar electrocautery. The cecum was mobilized some from the right sidewall. The right and left peritoneum were opened parallel to the IP ligament to open the retroperitoneal spaces bilaterally. The round ligaments were transected. The SLN mapping was performed in bilateral pelvic basins. After identifying the ureters, the para rectal and paravesical spaces were opened up entirely with careful dissection below the level of the ureters bilaterally and to the depth of the uterine artery origin in order to skeletonize the uterine "web" and ensure visualization of all parametrial channels. This was done with some difficulty given significant retroperitoneal adiposity. Lymphatic channels were identified travelling to the following visualized sentinel lymph node: left external iliac SLN. This SLN was separated from its surrounding lymphatic tissue, removed and sent for permanent pathology. No mapping was seen on the right.   During the SLN removal on the left, a perforator from the external iliac artery to the node was transected with brisk bleeding noted. This vessel was isolated and cauterized. Hemostasis was noted.   The hysterectomy was started.   The ureter was again noted to be on the medial leaf of the broad ligament.  The peritoneum above the ureter was incised and stretched and the infundibulopelvic ligament was skeletonized, cauterized and cut.  The posterior peritoneum was taken down to the level of the KOH ring.  The anterior peritoneum was also taken down.  The bladder flap was created to the level of the KOH ring.  The uterine artery on the right side was skeletonized, cauterized and cut in the normal manner.  A similar procedure was performed on the left.  The colpotomy was made and the uterus, cervix, bilateral ovaries and tubes were amputated, placed in an Endocatch bag, and delivered through the vagina.  Pedicles were inspected and excellent hemostasis was achieved.    The colpotomy at the vaginal cuff was closed with 0 Vicryl on a CT1 needle with a figure of eight stitch at each apex. The midportion of the cuff was closed with 0 V-Lock in running manner.  Irrigation was used and excellent hemostasis was achieved.   Cystoscopy was then performed with findings noted above. Foley catheter was replaced.   The pelvis was irrigated again. Floseal was placed within the bed of the left pelvic SLN dissection and pressure held. Arista was placed over the deep pelvic surgical bed.  At this point in the procedure was completed.  Robotic instruments were removed under direct visulaization.  The robot was undocked. The fascia at  the 10-12 mm port was closed with 0 Vicryl on a UR-5 needle.  The subcuticular tissue was closed with 4-0 Vicryl and the skin was closed with 4-0 Monocryl in a subcuticular manner.  Dermabond was applied.    A large vaginal laceration was noted from the cuff to the distal vagina on the right. This was repaired with running 2-0 Vircyl in locking manner. Several figure of eight stitch were then used to assure hemostasis. Vagina was packed with moistening vaginal packing, which will be removed in the morning.   All sponge, lap  and needle counts were correct x  3.   The patient was transferred to the recovery room in stable condition.  Jeral Pinch, MD

## 2022-04-23 NOTE — Transfer of Care (Signed)
Immediate Anesthesia Transfer of Care Note  Patient: Donna [name]  Procedure(s) Performed: Procedure(s): XI ROBOTIC ASSISTED TOTAL HYSTERECTOMY WITH BILATERAL SALPINGO OOPHORECTOMY with Cystoscopy, left external iliac sentinel lymph node dissection (N/A) LYMPH NODE DISSECTION (N/A)  Patient Location: PACU  Anesthesia Type:General  Level of Consciousness: Alert, Awake, Oriented  Airway & Oxygen Therapy: Patient Spontanous Breathing  Post-op Assessment: Report given to RN  Post vital signs: Reviewed and stable  Last Vitals:  Vitals:   04/23/22 1141 04/23/22 1145  BP: 117/66 110/66  Pulse: 84 80  Resp: 20 (!) 24  Temp: (P) 36.6 C   SpO2: 99991111 [id number]    Complications: No apparent anesthesia complications

## 2022-04-24 ENCOUNTER — Encounter (HOSPITAL_COMMUNITY): Payer: Self-pay | Admitting: Gynecologic Oncology

## 2022-04-24 ENCOUNTER — Telehealth: Payer: Self-pay | Admitting: Oncology

## 2022-04-24 DIAGNOSIS — C541 Malignant neoplasm of endometrium: Secondary | ICD-10-CM | POA: Diagnosis not present

## 2022-04-24 LAB — BASIC METABOLIC PANEL
Anion gap: 5 (ref 5–15)
Anion gap: 7 (ref 5–15)
BUN: 13 mg/dL (ref 6–20)
BUN: 15 mg/dL (ref 6–20)
CO2: 23 mmol/L (ref 22–32)
CO2: 24 mmol/L (ref 22–32)
Calcium: 7.8 mg/dL — ABNORMAL LOW (ref 8.9–10.3)
Calcium: 7.9 mg/dL — ABNORMAL LOW (ref 8.9–10.3)
Chloride: 106 mmol/L (ref 98–111)
Chloride: 104 mmol/L (ref 98–111)
Creatinine, Ser: 0.79 mg/dL (ref 0.44–1.00)
Creatinine, Ser: 0.94 mg/dL (ref 0.44–1.00)
GFR, Estimated: >60 mL/min (ref >60)
GFR, Estimated: >60 mL/min (ref >60)
Glucose, Bld: 141 mg/dL — ABNORMAL HIGH (ref 70–99)
Glucose, Bld: 114 mg/dL — ABNORMAL HIGH (ref 70–99)
Potassium: 4.1 mmol/L (ref 3.5–5.1)
Potassium: 3.8 mmol/L (ref 3.5–5.1)
Sodium: 134 mmol/L — ABNORMAL LOW (ref 135–145)
Sodium: 135 mmol/L (ref 135–145)

## 2022-04-24 LAB — CBC
HCT: 31.8 % — ABNORMAL LOW (ref 36.0–46.0)
HCT: 34.1 % — ABNORMAL LOW (ref 36.0–46.0)
Hemoglobin: 10.1 g/dL — ABNORMAL LOW (ref 12.0–15.0)
Hemoglobin: 10.9 g/dL — ABNORMAL LOW (ref 12.0–15.0)
MCH: 29 pg (ref 26.0–34.0)
MCH: 30.4 pg (ref 26.0–34.0)
MCHC: 31.8 g/dL (ref 30.0–36.0)
MCHC: 32 g/dL (ref 30.0–36.0)
MCV: 91.4 fL (ref 80.0–100.0)
MCV: 95.3 fL (ref 80.0–100.0)
Platelets: 402 10*3/uL — ABNORMAL HIGH (ref 150–400)
Platelets: 401 10*3/uL — ABNORMAL HIGH (ref 150–400)
RBC: 3.48 MIL/uL — ABNORMAL LOW (ref 3.87–5.11)
RBC: 3.58 MIL/uL — ABNORMAL LOW (ref 3.87–5.11)
RDW: 13.8 % (ref 11.5–15.5)
RDW: 14.2 % (ref 11.5–15.5)
WBC: 14.9 10*3/uL — ABNORMAL HIGH (ref 4.0–10.5)
WBC: 15.7 10*3/uL — ABNORMAL HIGH (ref 4.0–10.5)
nRBC: 0 % (ref 0.0–0.2)
nRBC: 0 % (ref 0.0–0.2)

## 2022-04-24 LAB — HEMOGLOBIN AND HEMATOCRIT, BLOOD
HCT: 31.1 % — ABNORMAL LOW (ref 36.0–46.0)
Hemoglobin: 9.9 g/dL — ABNORMAL LOW (ref 12.0–15.0)

## 2022-04-24 MED ORDER — SODIUM CHLORIDE 0.9 % IV SOLN: INTRAVENOUS | Status: DC

## 2022-04-24 MED ORDER — LIDOCAINE HCL URETHRAL/MUCOSAL 2 % EX GEL
1.0000 | Freq: Once | CUTANEOUS | Status: AC
  Administered 2022-04-24: 1 via URETHRAL
  Filled 2022-04-24: qty 5

## 2022-04-24 MED ORDER — CHLORHEXIDINE GLUCONATE CLOTH 2 % EX PADS
6.0000 | MEDICATED_PAD | Freq: Every day | CUTANEOUS | Status: DC
  Administered 2022-04-24 – 2022-04-25 (×2): 6 via TOPICAL

## 2022-04-24 NOTE — Telephone Encounter (Signed)
Left a message for Hubbard Robinson, PA-C and his nurse advising patient may go home today and asking if she can get additional medication for breath through pain. Requested a return call.

## 2022-04-24 NOTE — Progress Notes (Signed)
1 Day Post-Op Procedure(s) (LRB): XI ROBOTIC ASSISTED TOTAL HYSTERECTOMY WITH BILATERAL SALPINGO OOPHORECTOMY with Cystoscopy, left external iliac sentinel lymph node dissection (N/A) LYMPH NODE DISSECTION (N/A)  Subjective: Patient reports doing well. Abdomen is sore, especially LUQ incision, improved from yesterday. Tolerating PO without N/V. No flatus. Denies dizziness.   Objective: Vital signs in last 24 hours: Temp:  [97.3 F (36.3 C)-99 F (37.2 C)] 97.9 F (36.6 C) (02/29 0558) Pulse Rate:  [78-110] 85 (02/29 0558) Resp:  [16-24] 18 (02/29 0558) BP: (101-146)/(57-85) 103/64 (02/29 0558) SpO2:  [95 %-100 %] 98 % (02/29 0558) Last BM Date : 04/23/22  Intake/Output from previous day: 02/28 0701 - 02/29 0700 In: 2247.8 [P.O.:720; I.V.:1427.8; IV Piggyback:100] Out: 2750 [Urine:2400; Blood:350]  Physical Examination: Gen: alert, no acute distress, oriented CV: regular rate and rhythm, no murmurs or rubs Pulmonary: somewhat distant breath sounds, but lungs are clear to auscultation bilaterally, no wheezes or rhonchi Abdomen: soft, appropriately tender to palpation, incision are clean/dry/intact, hypoactive bowel sounds GU: vaginal packing removed with older blood stains intermittently along packing, no fresh blood, no blood on pad beneath patient. Foley catheter in place with clear yellow urine Ext: warm and well perfused, SCDs in place  Labs:    Latest Ref Rng & Units 04/24/2022    4:41 AM 04/23/2022    5:48 PM 04/15/2022    2:13 PM  CBC  WBC 4.0 - 10.5 K/uL 14.9  14.7  10.2   Hemoglobin 12.0 - 15.0 g/dL 10.1  11.9  12.0   Hematocrit 36.0 - 46.0 % 31.8  36.6  36.7   Platelets 150 - 400 K/uL 402  409  408       Latest Ref Rng & Units 04/24/2022    4:41 AM 04/15/2022    2:13 PM 03/04/2022    8:06 AM  BMP  Glucose 70 - 99 mg/dL 141  118  124   BUN 6 - 20 mg/dL '13  14  12   '$ Creatinine 0.44 - 1.00 mg/dL 0.79  0.80  0.50   Sodium 135 - 145 mmol/L 134  138  138   Potassium  3.5 - 5.1 mmol/L 4.1  3.7  4.0   Chloride 98 - 111 mmol/L 106  104  103   CO2 22 - 32 mmol/L 23  24    Calcium 8.9 - 10.3 mg/dL 7.8  8.4      Assessment:  57 y.o. s/p Procedure(s): XI ROBOTIC ASSISTED TOTAL HYSTERECTOMY WITH BILATERAL SALPINGO OOPHORECTOMY with Cystoscopy, left external iliac sentinel lymph node dissection LYMPH NODE DISSECTION: meeting early milestones.  Post-op: Tolerating PO, awaiting return of bowel function. Will get patient up and moving to assure no significant vaginal bleeding before discontinuing foley catheter.   Chronic pain on opioids: have communicated with pain medicine MD with which patient has pain contract. Discussion yesterday per my nurse navigator is that he will provide increased narcotic coverage during post-op period.   Acute anemia in the setting of operative blood loss and hemodilution: suspect H&H yesterday evening not reflective of true EBL. Will plan to repeat H&H mid morning to assure no significant decrease from labs this am.  Prophylaxis: SCDs, ambulation, lovenox if still in the hospital this afternoon.  Plan: Discharge later today. The patient is to be discharged to home.   LOS: 0 days    Donna [name] 04/24/2022, 7:28 AM

## 2022-04-24 NOTE — Progress Notes (Signed)
GYN ONC Progress Note  Patient sitting in the chair in no distress, appears comfortable. Reports tolerating her diet with no nausea or emesis. Continues to report lower abdominal pain and back pain. No flatus reported. Feels steady when out of bed. Has voided 100 cc of urine since foley removal. States she did not even know she was voiding and it came out with a slow stream.  Bladder scan with 0 cc on read but limited due to habitus. Discussed with patient foley catheter placement to check for retention. With chaperone present, patient voiced severe discomfort with mild movement of the labia. Topical lidocaine ordered and applied by RN. Dr. Berline Lopes aware of situation.

## 2022-04-24 NOTE — Discharge Summary (Signed)
Physician Discharge Summary  Patient ID: Donna [name] MRN: [id number] DOB/AGE: 03/22/1965 57 y.o.  Admit date: 04/23/2022 Discharge date: 04/25/2022  Admission Diagnoses: Endometrial cancer [facility])  Discharge Diagnoses:  Principal Problem:   Endometrial cancer [facility])   Discharged Condition:  The patient is in good condition and stable for discharge.    Hospital Course: On 04/23/2022, the patient underwent the following: Procedure(s): XI ROBOTIC ASSISTED TOTAL HYSTERECTOMY WITH BILATERAL SALPINGO OOPHORECTOMY with Cystoscopy, left external iliac sentinel lymph node dissection LYMPH NODE DISSECTION. The postoperative course was uneventful.  Given the repair of large right sidewall vaginal laceration, she had vaginal packing placed overnight along with a foley catheter. This was removed the am of POD 1 and the patient had minimal vaginal bleeding. The patient was unable to void adequate amounts after foley removal. Bladder scan limited due to body habitus. A foley was replaced in the afternoon of 04/24/22 with plans for the patient to be discharged home with this with removal in the office on Monday, April 28, 2022. She was discharged to home on postoperative day 2 tolerating a regular diet, ambulating, pain controlled with oral medications, minimal vaginal bleeding, foley in place.   Consults: None  Significant Diagnostic Studies: Labs  Treatments: Surgery: see above  Discharge Exam (from am assessment): Blood pressure 116/83, pulse 90, temperature (!) 97.4 F (36.3 C), temperature source Oral, resp. rate 14, height '5\' 8"'$  (1.727 m), weight (!) 335 lb (152 kg), last menstrual period 04/15/2022, SpO2 100 %. Gen: alert, no acute distress, oriented CV: regular rate and rhythm, no murmurs or rubs Pulmonary: somewhat distant breath sounds, but lungs are clear to auscultation bilaterally, no wheezes or rhonchi Abdomen: soft, appropriately tender to palpation, incision are clean/dry/intact, mildly  hypoactive bowel sounds Ext: warm and well perfused, SCDs in place Foley in place with concentrated urine.  Disposition: Discharge disposition: 01-Home or Self Care       Discharge Instructions     Call MD for:  difficulty breathing, headache or visual disturbances   Complete by: As directed    Call MD for:  extreme fatigue   Complete by: As directed    Call MD for:  hives   Complete by: As directed    Call MD for:  persistant dizziness or light-headedness   Complete by: As directed    Call MD for:  persistant nausea and vomiting   Complete by: As directed    Call MD for:  redness, tenderness, or signs of infection (pain, swelling, redness, odor or green/yellow discharge around incision site)   Complete by: As directed    Call MD for:  severe uncontrolled pain   Complete by: As directed    Call MD for:  temperature >100.4   Complete by: As directed    Diet - low sodium heart healthy   Complete by: As directed    Driving Restrictions   Complete by: As directed    No driving for around 1 week(s).  Do not take narcotics and drive. You need to make sure your reaction time has returned.   Increase activity slowly   Complete by: As directed    Lifting restrictions   Complete by: As directed    No lifting greater than 10 lbs, pushing, pulling, straining for 6 weeks.   Sexual Activity Restrictions   Complete by: As directed    No sexual activity, nothing in the vagina, for 10-12 weeks.      Allergies as of 04/25/2022  Reactions   Celebrex [celecoxib] Swelling   Facial edema    Ibuprofen Swelling   Per pt all brands    Oxycontin [oxycodone Hcl] Other (See Comments)   Hallucinations.  Has taken morphine and other pain meds and is fine.        Medication List     STOP taking these medications    medroxyPROGESTERone 10 MG tablet Commonly known as: Provera       TAKE these medications    clonazePAM 1 MG tablet Commonly known as: KlonoPIN Take 1 tablet (1  mg total) by mouth 2 (two) times daily. What changed: when to take this   ferrous gluconate 324 MG tablet Commonly known as: FERGON Take 1 tablet (324 mg total) by mouth daily with breakfast.   HYDROcodone-acetaminophen 5-325 MG tablet Commonly known as: NORCO/VICODIN Take 1 tablet by mouth every 6 (six) hours as needed for moderate pain.   olmesartan-hydrochlorothiazide 20-12.5 MG tablet Commonly known as: BENICAR HCT Take 1 tablet by mouth daily.   senna-docusate 8.6-50 MG tablet Commonly known as: Senokot-S Take 2 tablets by mouth at bedtime. For AFTER surgery, do not take if having diarrhea   solifenacin 10 MG tablet Commonly known as: VESICARE Take 1 tablet (10 mg total) by mouth daily.         Follow-up Information     Lafonda Mosses, MD Follow up on 05/07/2022.   Specialty: Gynecologic Oncology Why: at 5pm will be a PHONE call to check in with Dr. Berline Lopes and discuss pathology. IN PERSON at the [place] will be on 05/16/22. Contact information: [place] [address] [facility] Follow up on 04/28/2022.   Specialty: Gynecologic Oncology Why: at 1pm for foley removal, voiding trial at the [facility] information: [place] [id number] [place] [phone]                Greater than thirty minutes were spend for face to face discharge instructions and discharge orders/summary in EPIC.   Signed: Dorothyann Gibbs 04/25/2022, 1:45 PM

## 2022-04-24 NOTE — Telephone Encounter (Signed)
Called Donna [name] at [facility] regarding increased pain needs after surgery.  She said Donna Robinson, PA-C just sent a new prescription to Donna [name]'s pharmacy for hydrocodone/acetaminophen 5/325 mg and changed it so she can take it 3 times daily instead of 2.    Called Donna [name] and let her know.  She is concerned that this will not be enough to control her pain.  Advised her to call [name]/Donna Sabra Heck, PA-C to address her concerns and see if they will increase the amount.

## 2022-04-24 NOTE — Progress Notes (Signed)
GYN ONC Progress note  Patient alert, oriented, in bed, in no acute distress. With consent and two chaperones present for assistance, patient agreeable to foley placement. 71 F foley placed per protocol without difficulty. Patient tolerated this well. 25 cc of dark concentrated urine out after placement. Dr. Berline Lopes notified. Plan for labs now, restarting IVF, foley to remain in place for output monitoring overnight with plans for voiding trial in the am.

## 2022-04-24 NOTE — Progress Notes (Signed)
  Transition of Care [facility]) Screening Note   Patient Details  Name: Donna [name] Date of Birth: 1966/02/22   Transition of Care [facility]) CM/SW Contact:    Bethann Berkshire, [place] Phone Number: 04/24/2022, 1:34 PM    Transition of Care Department [facility]) has reviewed patient and no TOC needs have been identified at this time. We will continue to monitor patient advancement through interdisciplinary progression rounds. If new patient transition needs arise, please place a TOC consult.

## 2022-04-24 NOTE — Progress Notes (Signed)
Mobility Specialist - Progress Note   04/24/22 1316  Mobility  Activity Ambulated with assistance in hallway  Level of Assistance Standby assist, set-up cues, supervision of patient - no hands on  Assistive Device Front wheel walker  Distance Ambulated (ft) 250 ft  Activity Response Tolerated well  Mobility Referral Yes  $Mobility charge 1 Mobility   Pt received in recliner and agreeable to mobility. Son, wheeled chair behind pt. Pt took 1x seated rest break but still eager to ambulate afterwards. No complaints during session.  Pt to recliner after session with all needs met.     [facility]

## 2022-04-25 DIAGNOSIS — C541 Malignant neoplasm of endometrium: Secondary | ICD-10-CM | POA: Diagnosis not present

## 2022-04-25 DIAGNOSIS — Z90710 Acquired absence of both cervix and uterus: Secondary | ICD-10-CM

## 2022-04-25 DIAGNOSIS — Z90722 Acquired absence of ovaries, bilateral: Secondary | ICD-10-CM

## 2022-04-25 LAB — BASIC METABOLIC PANEL
Anion gap: 7 (ref 5–15)
BUN: 16 mg/dL (ref 6–20)
CO2: 22 mmol/L (ref 22–32)
Calcium: 7.9 mg/dL — ABNORMAL LOW (ref 8.9–10.3)
Chloride: 107 mmol/L (ref 98–111)
Creatinine, Ser: 0.79 mg/dL (ref 0.44–1.00)
GFR, Estimated: >60 mL/min (ref >60)
Glucose, Bld: 124 mg/dL — ABNORMAL HIGH (ref 70–99)
Potassium: 3.6 mmol/L (ref 3.5–5.1)
Sodium: 136 mmol/L (ref 135–145)

## 2022-04-25 LAB — CBC
HCT: 30 % — ABNORMAL LOW (ref 36.0–46.0)
Hemoglobin: 9.3 g/dL — ABNORMAL LOW (ref 12.0–15.0)
MCH: 29.8 pg (ref 26.0–34.0)
MCHC: 31 g/dL (ref 30.0–36.0)
MCV: 96.2 fL (ref 80.0–100.0)
Platelets: 335 10*3/uL (ref 150–400)
RBC: 3.12 MIL/uL — ABNORMAL LOW (ref 3.87–5.11)
RDW: 14.1 % (ref 11.5–15.5)
WBC: 11.6 10*3/uL — ABNORMAL HIGH (ref 4.0–10.5)
nRBC: 0 % (ref 0.0–0.2)

## 2022-04-25 MED ORDER — SIMETHICONE 80 MG PO CHEW
80.0000 mg | CHEWABLE_TABLET | Freq: Once | ORAL | Status: AC
  Filled 2022-04-25: qty 1

## 2022-04-25 MED ORDER — POLYETHYLENE GLYCOL 3350 17 G PO PACK
17.0000 g | PACK | Freq: Every day | ORAL | Status: DC
  Administered 2022-04-25: 17 g via ORAL
  Filled 2022-04-25: qty 1

## 2022-04-25 NOTE — Progress Notes (Signed)
Reviewed written d/c instructions w pt and her son and all questions answered. They both verbalized understanding. Also reviewed foley care and how to empty, the\y both verbalized understanding. D/c via w/c w all belongings in stable condition.

## 2022-04-25 NOTE — Progress Notes (Signed)
2 Days Post-Op Procedure(s) (LRB): XI ROBOTIC ASSISTED TOTAL HYSTERECTOMY WITH BILATERAL SALPINGO OOPHORECTOMY with Cystoscopy, left external iliac sentinel lymph node dissection (N/A) LYMPH NODE DISSECTION (N/A)  Subjective: Patient reports doing well but having more soreness today. Abdomen is sore, especially LUQ incision and back. Had nausea prior to breakfast but able to eat an omelette without issue. No emesis. No flatus. Denies dizziness. Denies chest pain. States she always has mild shortness of breath. Ambulating with walker assist with no difficulty. No needs voiced.  Objective: Vital signs in last 24 hours: Temp:  [97.4 F (36.3 C)-98.8 F (37.1 C)] 98.4 F (36.9 C) (03/01 0508) Pulse Rate:  [90-100] 95 (03/01 0508) Resp:  [14-18] 14 (03/01 0508) BP: (105-112)/(61-102) 105/68 (03/01 0508) SpO2:  [94 %-100 %] 94 % (03/01 0508) Last BM Date : 04/23/22  Intake/Output from previous day: 02/29 0701 - 03/01 0700 In: 2180.9 [P.O.:1080; I.V.:1100.9] Out: 650 [Urine:650]  Physical Examination: Gen: alert, no acute distress, oriented CV: regular rate and rhythm, no murmurs or rubs Pulmonary: somewhat distant breath sounds, but lungs are clear to auscultation bilaterally, no wheezes or rhonchi Abdomen: soft, appropriately tender to palpation, incision are clean/dry/intact, mildly hypoactive bowel sounds, binder in place Ext: warm and well perfused, SCDs in place Foley in place with concentrated urine.  Labs:    Latest Ref Rng & Units 04/25/2022    5:28 AM 04/24/2022    6:05 PM 04/24/2022   10:03 AM  CBC  WBC 4.0 - 10.5 K/uL 11.6  15.7    Hemoglobin 12.0 - 15.0 g/dL 9.3  10.9  9.9   Hematocrit 36.0 - 46.0 % 30.0  34.1  31.1   Platelets 150 - 400 K/uL 335  401        Latest Ref Rng & Units 04/25/2022    5:28 AM 04/24/2022    6:05 PM 04/24/2022    4:41 AM  BMP  Glucose 70 - 99 mg/dL 124  114  141   BUN 6 - 20 mg/dL '16  15  13   '$ Creatinine 0.44 - 1.00 mg/dL 0.79  0.94  0.79    Sodium 135 - 145 mmol/L 136  135  134   Potassium 3.5 - 5.1 mmol/L 3.6  3.8  4.1   Chloride 98 - 111 mmol/L 107  104  106   CO2 22 - 32 mmol/L '22  24  23   '$ Calcium 8.9 - 10.3 mg/dL 7.9  7.9  7.8     Assessment: 57 y.o. s/p Procedure(s): XI ROBOTIC ASSISTED TOTAL HYSTERECTOMY WITH BILATERAL SALPINGO OOPHORECTOMY with Cystoscopy, left external iliac sentinel lymph node dissection: meeting early milestones.  Post-op: Tolerating PO, awaiting return of bowel function. Tolerating diet. Foley replaced last pm due to decreased urine output, questionable retention.  Chronic pain on opioids: have communicated with pain medicine MD with which patient has pain contract. Pain provider has sent in additional medications.  Acute anemia in the setting of operative blood loss and hemodilution.  Prophylaxis: SCDs, ambulation, lovenox.  Plan: Discharge later today. The patient is to be discharged to home with foley in place. Plans for voiding trial beginning of next week at [facility].   LOS: 0 days    Donna [name] 04/25/2022, 8:48 AM

## 2022-04-28 ENCOUNTER — Other Ambulatory Visit: Payer: Self-pay

## 2022-04-28 ENCOUNTER — Inpatient Hospital Stay: Payer: Medicaid Other | Attending: Psychiatry

## 2022-04-28 VITALS — BP 112/70 | HR 75 | Temp 98.6°F | Resp 16

## 2022-04-28 DIAGNOSIS — R300 Dysuria: Secondary | ICD-10-CM | POA: Insufficient documentation

## 2022-04-28 DIAGNOSIS — Z90722 Acquired absence of ovaries, bilateral: Secondary | ICD-10-CM | POA: Insufficient documentation

## 2022-04-28 DIAGNOSIS — C541 Malignant neoplasm of endometrium: Secondary | ICD-10-CM

## 2022-04-28 DIAGNOSIS — Z90710 Acquired absence of both cervix and uterus: Secondary | ICD-10-CM | POA: Insufficient documentation

## 2022-04-28 DIAGNOSIS — N390 Urinary tract infection, site not specified: Secondary | ICD-10-CM | POA: Insufficient documentation

## 2022-04-28 NOTE — Progress Notes (Signed)
GYN Oncology Post-operative Follow Up   Donna [name] is a 57 year old female with a history of endometrial cancer who underwent XI ROBOTIC ASSISTED TOTAL HYSTERECTOMY WITH BILATERAL SALPINGO OOPHORECTOMY with Cystoscopy, left external iliac sentinel lymph node dissection LYMPH NODE DISSECTION on 04/23/22 with Dr. Jeral Pinch. She presents to the office today for foley catheter removal and trial voiding.    She states she has been doing well at home. Tolerating diet with no nausea or emesis.  She states her pain is 8/10.  Her bowels are moving but stool is hard. She states she has had adequate urine output in the catheter at home. She denies any vaginal bleeding or discharge.  Denies chest pain and shortness of breath.  No fever.  No lower extremity edema reported.  No concerns voiced.    Exam: Alert, oriented x 3, in no acute distress.    Clear, yellow urine noted in the catheter bag. 210 cc of sterile normal saline instilled in the bladder via the catheter. Patient tolerated this well. Foley was removed without difficulty. The patient was able to void 200 cc after foley removal.    Assessment/Plan: 57 year old s/p XI ROBOTIC ASSISTED TOTAL HYSTERECTOMY WITH BILATERAL SALPINGO OOPHORECTOMY with Cystoscopy, left external iliac sentinel lymph node dissection LYMPH NODE DISSECTION  presenting to the office for foley catheter removal and voiding trial. She was able to void 200 cc without difficulty after instillation of 210 cc of sterile normal saline. Reportable signs and symptoms reviewed including signs of urinary retention.    This appointment is included in the global surgical bundle and has no charge.    Alert, oriented x 3, in no acute distress.       Post op: Spoke with Donna [name] this morning. She states she is eating, drinking and urinating well. She is having bowel movements but states they are hard. She is taking senokot as prescribed. Encouraged her to increase senokot to twice daily and add  Miralax twice daily as well. Also encouraged her to increase water intake and try prune juice or apple juice. She denies fever or chills. Incisions are dry and intact. She rates her pain 8/10. Her pain is controlled with Vicodin and Tylenol.     Instructed to call office with any fever, chills, purulent drainage, uncontrolled pain or any other questions or concerns. Patient verbalizes understanding.   Pt aware of post op appointments as well as the office number [phone] and after hours number [phone] to call if she has any questions or concerns.

## 2022-04-29 ENCOUNTER — Encounter: Payer: Self-pay | Admitting: Gynecologic Oncology

## 2022-04-29 ENCOUNTER — Inpatient Hospital Stay: Payer: Medicaid Other

## 2022-04-29 ENCOUNTER — Telehealth: Payer: Self-pay | Admitting: *Deleted

## 2022-04-29 ENCOUNTER — Inpatient Hospital Stay: Payer: Medicaid Other | Admitting: Genetic Counselor

## 2022-04-29 NOTE — Telephone Encounter (Signed)
Spoke with the patient regarding a missed genetics appts. Patient sated I had transportation issues and I will call the office back once I have new transportation

## 2022-05-05 ENCOUNTER — Telehealth: Payer: Self-pay | Admitting: Oncology

## 2022-05-05 ENCOUNTER — Inpatient Hospital Stay: Payer: Medicaid Other

## 2022-05-05 ENCOUNTER — Other Ambulatory Visit: Payer: Self-pay

## 2022-05-05 DIAGNOSIS — C541 Malignant neoplasm of endometrium: Secondary | ICD-10-CM | POA: Diagnosis present

## 2022-05-05 DIAGNOSIS — Z90722 Acquired absence of ovaries, bilateral: Secondary | ICD-10-CM | POA: Diagnosis not present

## 2022-05-05 DIAGNOSIS — N3945 Continuous leakage: Secondary | ICD-10-CM

## 2022-05-05 DIAGNOSIS — R300 Dysuria: Secondary | ICD-10-CM

## 2022-05-05 DIAGNOSIS — N390 Urinary tract infection, site not specified: Secondary | ICD-10-CM | POA: Diagnosis not present

## 2022-05-05 DIAGNOSIS — Z90710 Acquired absence of both cervix and uterus: Secondary | ICD-10-CM | POA: Diagnosis not present

## 2022-05-05 LAB — URINALYSIS, COMPLETE (UACMP) WITH MICROSCOPIC
Bilirubin Urine: NEGATIVE
Glucose, UA: NEGATIVE mg/dL
Ketones, ur: NEGATIVE mg/dL
Nitrite: POSITIVE — AB
Protein, ur: 30 mg/dL — AB
Specific Gravity, Urine: 1.015 (ref 1.005–1.030)
WBC, UA: >50 WBC/hpf (ref 0–5)
pH: 5 (ref 5.0–8.0)

## 2022-05-05 NOTE — Telephone Encounter (Signed)
Called Donna [name] and advised her that Dr. Berline Lopes will be back in the office later this week. Discussed that we just wanted to give her some info while she is waiting. Discussed that the left sentinel lymph node was negative for cancer spread. The endometrial cancer went into the wall of the uterus less than halfway. No involvement of the cancer with the cervix. No evidence of lymphovascular invasion (no evidence of cancer cells in the tiny blood vessels and lymph channels in the uterus). Fallopian tubes and ovaries unremarkable. Discussed that Dr. Berline Lopes will discuss results with her as well at her phone visit on 05/07/22.  Donna [name] said that she has been having an issue with urinary incontinence for the last couple of days.  She is having urine dribble constantly and has to wear a diaper. She said she was taken off of Vesicare in the hospital and restarted it 4 days ago.  She also said her bladder feels heavy and has an uncomfortable feeling when urinating.  She thinks she would be able to give a urine sample and has been scheduled for lab at 3:00 today.

## 2022-05-06 ENCOUNTER — Other Ambulatory Visit: Payer: Self-pay | Admitting: Gynecologic Oncology

## 2022-05-06 ENCOUNTER — Telehealth: Payer: Self-pay

## 2022-05-06 ENCOUNTER — Other Ambulatory Visit: Payer: Self-pay

## 2022-05-06 DIAGNOSIS — N390 Urinary tract infection, site not specified: Secondary | ICD-10-CM

## 2022-05-06 MED ORDER — NITROFURANTOIN MONOHYD MACRO 100 MG PO CAPS: 100.0000 mg | ORAL_CAPSULE | Freq: Two times a day (BID) | ORAL | 0 refills | Status: DC

## 2022-05-06 NOTE — Telephone Encounter (Signed)
Lvm for patient to call office regarding urinalysis results

## 2022-05-06 NOTE — Progress Notes (Signed)
Based on UA, starting macrobid. Patient have catheter in post-op due to decreased output

## 2022-05-06 NOTE — Telephone Encounter (Signed)
-----   Message from Dorothyann Gibbs, NP sent at 05/06/2022  9:42 AM EDT ----- Urine culture is pending but appears she has an infection on initial analysis. Will send in macrobid which she takes twice daily for 5 days. If the culture returns and we need to change antibiotics, we will let her know.

## 2022-05-07 ENCOUNTER — Inpatient Hospital Stay (HOSPITAL_BASED_OUTPATIENT_CLINIC_OR_DEPARTMENT_OTHER): Payer: Medicaid Other | Admitting: Gynecologic Oncology

## 2022-05-07 ENCOUNTER — Encounter: Payer: Self-pay | Admitting: Gynecologic Oncology

## 2022-05-07 DIAGNOSIS — R319 Hematuria, unspecified: Secondary | ICD-10-CM

## 2022-05-07 DIAGNOSIS — C541 Malignant neoplasm of endometrium: Secondary | ICD-10-CM

## 2022-05-07 DIAGNOSIS — Z90710 Acquired absence of both cervix and uterus: Secondary | ICD-10-CM

## 2022-05-07 DIAGNOSIS — Z7189 Other specified counseling: Secondary | ICD-10-CM

## 2022-05-07 DIAGNOSIS — Z90722 Acquired absence of ovaries, bilateral: Secondary | ICD-10-CM

## 2022-05-07 DIAGNOSIS — N390 Urinary tract infection, site not specified: Secondary | ICD-10-CM

## 2022-05-07 LAB — SURGICAL PATHOLOGY

## 2022-05-07 LAB — URINE CULTURE: Culture: >=100000 — AB
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
## 2022-05-07 NOTE — Telephone Encounter (Signed)
Another voicemail left for patient to call office regarding urinalysis result

## 2022-05-07 NOTE — Telephone Encounter (Signed)
Pt aware of results 

## 2022-05-16 ENCOUNTER — Other Ambulatory Visit: Payer: Self-pay

## 2022-05-16 ENCOUNTER — Encounter: Payer: Self-pay | Admitting: Gynecologic Oncology

## 2022-05-16 ENCOUNTER — Inpatient Hospital Stay (HOSPITAL_BASED_OUTPATIENT_CLINIC_OR_DEPARTMENT_OTHER): Payer: Medicaid Other | Admitting: Gynecologic Oncology

## 2022-05-16 VITALS — BP 120/71 | HR 76 | Temp 98.5°F | Wt 330.0 lb

## 2022-05-16 DIAGNOSIS — Z90710 Acquired absence of both cervix and uterus: Secondary | ICD-10-CM

## 2022-05-16 DIAGNOSIS — Z7189 Other specified counseling: Secondary | ICD-10-CM

## 2022-05-16 DIAGNOSIS — C541 Malignant neoplasm of endometrium: Secondary | ICD-10-CM

## 2022-05-16 DIAGNOSIS — N871 Moderate cervical dysplasia: Secondary | ICD-10-CM

## 2022-05-16 DIAGNOSIS — Z90722 Acquired absence of ovaries, bilateral: Secondary | ICD-10-CM

## 2022-05-16 NOTE — Patient Instructions (Signed)
It was good to see you today.  You are healing well from surgery.  Please remember, no heavy lifting for at least 6 weeks after surgery and nothing in the vagina for at least 10 weeks.  No additional treatment is required for your uterine cancer.  We will plan to follow you a little bit more closely, alternating visits every 6 months between my office and your OB/GYN.  I will see you for your first visit in 6 months.  Please call in May to schedule a visit to see me in September.  You develop any concerning symptoms between now and your next visit, such as vaginal bleeding, pelvic pain, change to bowel function, or unintentional weight loss, please call to see me sooner.
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
## 2022-05-19 ENCOUNTER — Other Ambulatory Visit: Payer: Self-pay | Admitting: Gynecologic Oncology

## 2022-05-19 DIAGNOSIS — N939 Abnormal uterine and vaginal bleeding, unspecified: Secondary | ICD-10-CM

## 2022-06-09 ENCOUNTER — Ambulatory Visit: Payer: Medicaid Other | Admitting: Obstetrics and Gynecology

## 2022-07-03 ENCOUNTER — Inpatient Hospital Stay: Payer: Medicaid Other | Attending: Psychiatry | Admitting: Gynecologic Oncology

## 2022-07-03 ENCOUNTER — Telehealth: Payer: Self-pay | Admitting: *Deleted

## 2022-07-03 ENCOUNTER — Inpatient Hospital Stay: Payer: Medicaid Other

## 2022-07-03 ENCOUNTER — Inpatient Hospital Stay (HOSPITAL_BASED_OUTPATIENT_CLINIC_OR_DEPARTMENT_OTHER): Payer: Medicaid Other | Admitting: Genetic Counselor

## 2022-07-03 ENCOUNTER — Encounter: Payer: Self-pay | Admitting: Genetic Counselor

## 2022-07-03 ENCOUNTER — Other Ambulatory Visit: Payer: Self-pay

## 2022-07-03 ENCOUNTER — Other Ambulatory Visit: Payer: Self-pay | Admitting: Genetic Counselor

## 2022-07-03 VITALS — BP 111/85 | HR 79 | Temp 98.4°F | Ht 68.9 in | Wt 322.6 lb

## 2022-07-03 DIAGNOSIS — C541 Malignant neoplasm of endometrium: Secondary | ICD-10-CM

## 2022-07-03 DIAGNOSIS — D75839 Thrombocytosis, unspecified: Secondary | ICD-10-CM

## 2022-07-03 DIAGNOSIS — Z8042 Family history of malignant neoplasm of prostate: Secondary | ICD-10-CM

## 2022-07-03 DIAGNOSIS — Z800 Family history of malignant neoplasm of digestive organs: Secondary | ICD-10-CM

## 2022-07-03 DIAGNOSIS — Z8542 Personal history of malignant neoplasm of other parts of uterus: Secondary | ICD-10-CM | POA: Insufficient documentation

## 2022-07-03 DIAGNOSIS — Z90710 Acquired absence of both cervix and uterus: Secondary | ICD-10-CM

## 2022-07-03 DIAGNOSIS — Z803 Family history of malignant neoplasm of breast: Secondary | ICD-10-CM

## 2022-07-03 DIAGNOSIS — Z801 Family history of malignant neoplasm of trachea, bronchus and lung: Secondary | ICD-10-CM | POA: Diagnosis not present

## 2022-07-03 DIAGNOSIS — Z8041 Family history of malignant neoplasm of ovary: Secondary | ICD-10-CM

## 2022-07-03 DIAGNOSIS — Z1379 Encounter for other screening for genetic and chromosomal anomalies: Secondary | ICD-10-CM

## 2022-07-03 DIAGNOSIS — Z90722 Acquired absence of ovaries, bilateral: Secondary | ICD-10-CM

## 2022-07-03 DIAGNOSIS — S3141XD Laceration without foreign body of vagina and vulva, subsequent encounter: Secondary | ICD-10-CM

## 2022-07-03 LAB — CBC WITH DIFFERENTIAL (CANCER CENTER ONLY)
Abs Immature Granulocytes: 0.02 10*3/uL (ref 0.00–0.07)
Basophils Absolute: 0 10*3/uL (ref 0.0–0.1)
Basophils Relative: 0 %
Eosinophils Absolute: 0.1 10*3/uL (ref 0.0–0.5)
Eosinophils Relative: 1 %
HCT: 38.5 % (ref 36.0–46.0)
Hemoglobin: 12.6 g/dL (ref 12.0–15.0)
Immature Granulocytes: 0 %
Lymphocytes Relative: 28 %
Lymphs Abs: 2.3 10*3/uL (ref 0.7–4.0)
MCH: 27.8 pg (ref 26.0–34.0)
MCHC: 32.7 g/dL (ref 30.0–36.0)
MCV: 85 fL (ref 80.0–100.0)
Monocytes Absolute: 0.6 10*3/uL (ref 0.1–1.0)
Monocytes Relative: 7 %
Neutro Abs: 5.5 10*3/uL (ref 1.7–7.7)
Neutrophils Relative %: 64 %
Platelet Count: 413 10*3/uL — ABNORMAL HIGH (ref 150–400)
RBC: 4.53 MIL/uL (ref 3.87–5.11)
RDW: 14 % (ref 11.5–15.5)
WBC Count: 8.5 10*3/uL (ref 4.0–10.5)
nRBC: 0 % (ref 0.0–0.2)

## 2022-07-03 LAB — GENETIC SCREENING ORDER
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
## 2022-07-03 NOTE — Patient Instructions (Signed)
The laceration is the vagina is healing well. I applied a topical medicine called silver nitrate to an area of healing tissue to help speed the healing process. You may have light spotting or grayish discharge from this medication used.  Monitor the area on your right inner thigh and apply neosporin as needed.   We will plan on checking your platelet count again today and will contact you with the results.   We will plan on seeing you in the office in six months or sooner if needed.  Symptoms to report to your health care team include vaginal bleeding, rectal bleeding, bloating, weight loss without effort, new and persistent pain, new and  persistent fatigue, new leg swelling, new masses (i.e., bumps in your neck or groin), new and persistent cough, new and persistent nausea and vomiting, change in bowel or bladder habits, and any other concerns.

## 2022-07-03 NOTE — Telephone Encounter (Signed)
Patient returned our call. Message relayed from Warner Mccreedy, NP, Pt's Platelet count is 416 and no evidence of cancer return on exam.   Pt has no further concerns at this time.
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
## 2022-07-07 ENCOUNTER — Telehealth: Payer: Self-pay | Admitting: *Deleted

## 2022-07-07 NOTE — Telephone Encounter (Signed)
Dr. Marcelino Duster Schroeder's office called in regards to patient is no longer taking her vesicare because it is not working for her incontinence.   Clinical staff with Dr. Florian Buff will notify provider for an alternative medication and will contact the patient directly. No further follow up requested from our office.

## 2022-07-07 NOTE — Telephone Encounter (Signed)
-----   Message from Doylene Bode, NP sent at 07/07/2022 11:42 AM EDT ----- The pt was started on vesicare by the Urogyn with [facility], Dr. Tildon Husky. She is prob out of maternity leave. The patient stated this medication was not helping with her incontinence so she stopped it. Could you call and see about medication alternatives? Thank you!

## 2022-07-07 NOTE — Telephone Encounter (Signed)
Entered in error

## 2022-07-15 ENCOUNTER — Telehealth: Payer: Self-pay | Admitting: Genetic Counselor

## 2022-07-15 ENCOUNTER — Ambulatory Visit: Payer: Self-pay | Admitting: Genetic Counselor

## 2022-07-15 ENCOUNTER — Encounter: Payer: Self-pay | Admitting: Genetic Counselor

## 2022-07-15 DIAGNOSIS — Z1379 Encounter for other screening for genetic and chromosomal anomalies: Secondary | ICD-10-CM

## 2022-07-15 DIAGNOSIS — Z800 Family history of malignant neoplasm of digestive organs: Secondary | ICD-10-CM

## 2022-07-15 DIAGNOSIS — C541 Malignant neoplasm of endometrium: Secondary | ICD-10-CM

## 2022-07-15 NOTE — Telephone Encounter (Signed)
Disclosed negative genetics and VUS in LZTR1.

## 2022-07-15 NOTE — Progress Notes (Signed)
HPI:   Donna [name] was previously seen in the [facility] due to a personal history of endometrial cancer, a family  history of colon cancer, and concerns regarding a hereditary predisposition to cancer.    Donna [name] recent genetic test results were disclosed to her by telephone. These results and recommendations are discussed in more detail below.  CANCER HISTORY:  At the age of 57, she was diagnosed with endometroid endometrial adenocarcinoma s/p TRH/BSO.   Oncology History  Endometrial cancer (HCC)  04/23/2022 Initial Diagnosis   Endometrial cancer (HCC)   07/14/2022 Genetic Testing   Negative [facility] multi-cancer +RNA panel. 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 Report date is 07/14/2022.  The Multi-Cancer + RNA Panel offered by [facility] includes sequencing and/or deletion/duplication analysis of the following 70 genes:  AIP*, ALK, APC*, ATM*, AXIN2*, BAP1*, BARD1*, BLM*, BMPR1A*, BRCA1*, BRCA2*, BRIP1*, CDC73*, CDH1*, CDK4, CDKN1B*, CDKN2A, CHEK2*, CTNNA1*, DICER1*, EPCAM (del/dup only), EGFR, FH*, FLCN*, GREM1 (promoter dup only), HOXB13, KIT, LZTR1, MAX*, MBD4, MEN1*, MET, MITF, MLH1*, MSH2*, MSH3*, MSH6*, MUTYH*, NF1*, NF2*, NTHL1*, PALB2*, PDGFRA, PMS2*, POLD1*, POLE*, POT1*, PRKAR1A*, PTCH1*, PTEN*, RAD51C*, RAD51D*, RB1*, RET, SDHA* (sequencing only), SDHAF2*, SDHB*, SDHC*, SDHD*, SMAD4*, SMARCA4*, SMARCB1*, SMARCE1*, STK11*, SUFU*, TMEM127*, TP53*, TSC1*, TSC2*, VHL*. RNA analysis is performed for * genes.      FAMILY HISTORY:  We obtained a detailed, 4-generation family history.  Significant diagnoses are listed below:      Family History  Problem Relation Age of Onset   Breast cancer Mother 55        metastatic   Colon cancer Maternal Aunt          dx 30s   Prostate cancer Maternal Uncle          x2 pat uncles   Lung cancer Paternal Uncle          dx 74s   Ovarian cancer Cousin          dx 61s         Donna [name] is unaware of previous family history  of genetic testing for hereditary cancer risks.  There is no reported Ashkenazi Jewish ancestry. There is no known consanguinity.    GENETIC TEST RESULTS:  The [facility] Multi-Cancer +RNA Panel found no pathogenic mutations.   The Multi-Cancer + RNA Panel offered by [facility] includes sequencing and/or deletion/duplication analysis of the following 70 genes:  AIP*, ALK, APC*, ATM*, AXIN2*, BAP1*, BARD1*, BLM*, BMPR1A*, BRCA1*, BRCA2*, BRIP1*, CDC73*, CDH1*, CDK4, CDKN1B*, CDKN2A, CHEK2*, CTNNA1*, DICER1*, EPCAM (del/dup only), EGFR, FH*, FLCN*, GREM1 (promoter dup only), HOXB13, KIT, LZTR1, MAX*, MBD4, MEN1*, MET, MITF, MLH1*, MSH2*, MSH3*, MSH6*, MUTYH*, NF1*, NF2*, NTHL1*, PALB2*, PDGFRA, PMS2*, POLD1*, POLE*, POT1*, PRKAR1A*, PTCH1*, PTEN*, RAD51C*, RAD51D*, RB1*, RET, SDHA* (sequencing only), SDHAF2*, SDHB*, SDHC*, SDHD*, SMAD4*, SMARCA4*, SMARCB1*, SMARCE1*, STK11*, SUFU*, TMEM127*, TP53*, TSC1*, TSC2*, VHL*. RNA analysis is performed for * genes.   The test report has been scanned into EPIC and is located under the Molecular Pathology section of the Results Review tab.  A portion of the result report is included below for reference. Genetic testing reported out on Jul 14, 2022.     Genetic testing identified a variant of uncertain significance (VUS) in the LZTR1 gene called c.1333G>A (p.Val445Met) .  At this time, it is unknown if this variant is associated with an increased risk for cancer or if it is benign, but most uncertain variants are reclassified to benign. It should not be used to make medical  management decisions. With time, we suspect the laboratory will determine the significance of this variant, if any. If the laboratory reclassifies this variant, we will attempt to contact Ms. Fedewa to discuss it further.   Even though a pathogenic variant was not identified, possible explanations for the cancer in the family may include: There may be no hereditary risk for cancer in the family. The  cancers in Donna [name] and/or her family may be sporadic/familial or due to other genetic and environmental factors. There may be a gene mutation in one of these genes that current testing methods cannot detect but that chance is small. There could be another gene that has not yet been discovered, or that we have not yet tested, that is responsible for the cancer diagnoses in the family.  It is also possible there is a hereditary cause for the cancer in the family that Donna [name] did not inherit.  Therefore, it is important to remain in touch with cancer genetics in the future so that we can continue to offer Donna [name] the most up to date genetic testing.     ADDITIONAL GENETIC TESTING:   Ms. Bergfeld genetic testing was fairly extensive.  If there are additional relevant genes identified to increase cancer risk that can be analyzed in the future, we would be happy to discuss and coordinate this testing at that time.     CANCER SCREENING RECOMMENDATIONS:  Donna [name] test result is considered negative (normal).  This means that we have not identified a hereditary cause for her personal history of endometrial cancer at this time.   An individual's cancer risk and medical management are not determined by genetic test results alone. Overall cancer risk assessment incorporates additional factors, including personal medical history, family history, and any available genetic information that may result in a personalized plan for cancer prevention and surveillance. Therefore, it is recommended she continue to follow the cancer management and screening guidelines provided by her oncology and primary healthcare provider.  RECOMMENDATIONS FOR FAMILY MEMBERS:   Since she did not inherit a identifiable mutation in a cancer predisposition gene included on this panel, her children could not have inherited a known mutation from her in one of these genes. Individuals in this family might be at some increased risk of  developing cancer, over the general population risk, due to the family history of cancer.  Individuals in the family should notify their providers of the family history of cancer. We recommend women in this family have a yearly mammogram beginning at age 35, or 38 years younger than the earliest onset of cancer, an annual clinical breast exam, and perform monthly breast self-exams.  Risk models that take into account family history and hormonal history may be helpful in determining appropriate breast cancer screening options for family members.  First degree relatives of those with colon cancer should receive colonoscopies beginning at age 46, or 10 years prior to the earliest diagnosis of colon cancer in the family, and receive colonoscopies at least every 5 years, or as recommended by their gastroenterologist.   We do not recommend familial testing for the LZTR1 variant of uncertain significance (VUS).  FOLLOW-UP:  Cancer genetics is a rapidly advancing field and it is possible that new genetic tests will be appropriate for her and/or her family members in the future. We encourage Ms. Shurley to remain in contact with cancer genetics, so we can update her personal and family histories and let her know of advances in cancer  genetics that may benefit this family.   Our contact number was provided.. she knows she is welcome to call us at anytime with additional questions or concerns.   Gilles Trimpe M. Rennie Plowman, MS, [facility] Genetic Counselor [email or web address] (P) [phone]

## 2022-07-17 ENCOUNTER — Telehealth: Payer: Self-pay

## 2022-07-17 NOTE — Telephone Encounter (Signed)
Pt scheduled for a f/u appt with Waldo Laine, NP for a medication change.

## 2022-07-23 ENCOUNTER — Ambulatory Visit: Payer: Medicaid Other | Admitting: Obstetrics and Gynecology

## 2022-07-30 ENCOUNTER — Other Ambulatory Visit: Payer: Self-pay | Admitting: Gynecologic Oncology

## 2022-07-30 DIAGNOSIS — C541 Malignant neoplasm of endometrium: Secondary | ICD-10-CM

## 2022-07-31 ENCOUNTER — Other Ambulatory Visit: Payer: Self-pay | Admitting: Family Medicine

## 2022-07-31 DIAGNOSIS — Z0000 Encounter for general adult medical examination without abnormal findings: Secondary | ICD-10-CM

## 2022-08-19 ENCOUNTER — Other Ambulatory Visit (HOSPITAL_COMMUNITY): Payer: Self-pay | Admitting: Specialist

## 2022-08-19 ENCOUNTER — Ambulatory Visit (HOSPITAL_COMMUNITY): Admission: RE | Admit: 2022-08-19 | Payer: Medicaid Other | Source: Ambulatory Visit

## 2022-08-19 DIAGNOSIS — M79604 Pain in right leg: Secondary | ICD-10-CM

## 2022-08-19 DIAGNOSIS — M79661 Pain in right lower leg: Secondary | ICD-10-CM

## 2022-09-01 ENCOUNTER — Ambulatory Visit
Admission: RE | Admit: 2022-09-01 | Discharge: 2022-09-01 | Disposition: A | Discharge status: Home / Self Care | Payer: Medicaid Other | Source: Ambulatory Visit | Attending: Family Medicine | Admitting: Family Medicine

## 2022-09-01 DIAGNOSIS — Z0000 Encounter for general adult medical examination without abnormal findings: Secondary | ICD-10-CM

## 2022-09-02 ENCOUNTER — Ambulatory Visit (HOSPITAL_COMMUNITY)
Admission: RE | Admit: 2022-09-02 | Discharge: 2022-09-02 | Disposition: A | Discharge status: Home / Self Care | Payer: Medicaid Other | Source: Ambulatory Visit | Attending: Specialist | Admitting: Specialist

## 2022-09-02 DIAGNOSIS — M79661 Pain in right lower leg: Secondary | ICD-10-CM | POA: Insufficient documentation

## 2022-09-02 DIAGNOSIS — M79604 Pain in right leg: Secondary | ICD-10-CM | POA: Diagnosis not present

## 2022-10-20 ENCOUNTER — Ambulatory Visit: Payer: Medicaid Other | Admitting: Gynecologic Oncology

## 2022-10-22 ENCOUNTER — Telehealth: Payer: Self-pay

## 2022-10-22 NOTE — Telephone Encounter (Signed)
Pt called office stating she has COVID. She needs to reschedule her appointment with Dr. Pricilla Holm.  First available appointment is 10/4 @ 4:15. Pt agreed to date and time.  Advised pt to get treatment at her PCP/urgent care or ER. She states her PCP recommended she go to the ER as well but she has animals to take care of and she doesn't want to go. She said she will see [place] in October

## 2022-10-24 ENCOUNTER — Ambulatory Visit: Payer: Medicaid Other | Admitting: Gynecologic Oncology

## 2022-10-24 ENCOUNTER — Inpatient Hospital Stay: Payer: Medicaid Other | Admitting: Gynecologic Oncology

## 2022-10-24 DIAGNOSIS — C541 Malignant neoplasm of endometrium: Secondary | ICD-10-CM

## 2022-11-10 ENCOUNTER — Telehealth: Payer: Self-pay | Admitting: *Deleted

## 2022-11-10 NOTE — Telephone Encounter (Signed)
Per Dr Pricilla Holm moved at on 10/4 to earlier in the day

## 2022-11-28 ENCOUNTER — Encounter: Payer: Self-pay | Admitting: Gynecologic Oncology

## 2022-11-28 ENCOUNTER — Inpatient Hospital Stay: Payer: Medicaid Other | Attending: Gynecologic Oncology | Admitting: Gynecologic Oncology

## 2022-11-28 VITALS — BP 132/72 | HR 79 | Temp 98.4°F | Resp 19 | Ht 68.0 in | Wt 321.4 lb

## 2022-11-28 DIAGNOSIS — N898 Other specified noninflammatory disorders of vagina: Secondary | ICD-10-CM | POA: Insufficient documentation

## 2022-11-28 DIAGNOSIS — Z8542 Personal history of malignant neoplasm of other parts of uterus: Secondary | ICD-10-CM | POA: Insufficient documentation

## 2022-11-28 DIAGNOSIS — F32A Depression, unspecified: Secondary | ICD-10-CM | POA: Insufficient documentation

## 2022-11-28 DIAGNOSIS — Z9079 Acquired absence of other genital organ(s): Secondary | ICD-10-CM | POA: Diagnosis not present

## 2022-11-28 DIAGNOSIS — F419 Anxiety disorder, unspecified: Secondary | ICD-10-CM | POA: Insufficient documentation

## 2022-11-28 DIAGNOSIS — R5383 Other fatigue: Secondary | ICD-10-CM | POA: Insufficient documentation

## 2022-11-28 DIAGNOSIS — C541 Malignant neoplasm of endometrium: Secondary | ICD-10-CM

## 2022-11-28 DIAGNOSIS — R32 Unspecified urinary incontinence: Secondary | ICD-10-CM | POA: Diagnosis not present

## 2022-11-28 DIAGNOSIS — Z90710 Acquired absence of both cervix and uterus: Secondary | ICD-10-CM | POA: Insufficient documentation

## 2022-11-28 DIAGNOSIS — Z90722 Acquired absence of ovaries, bilateral: Secondary | ICD-10-CM | POA: Insufficient documentation
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
## 2022-11-28 NOTE — Patient Instructions (Signed)
It was good to see you today.  I do not see or feel any evidence of cancer recurrence on your exam.  Please call your OB/GYN to get scheduled for a visit in 6 months.  At that visit, you would benefit from having a Pap and HPV test performed.  I will let you know what the biopsy results from today show.  Please call my office sometime next summer to get a visit scheduled to see me in October 2025.  The doctor you saw before about your urine symptoms with Dr. Florian Buff. Her office phone number is [phone].  As always, if you develop any new and concerning symptoms before your next visit, please call to see me sooner.

## 2022-12-01 ENCOUNTER — Telehealth: Payer: Self-pay

## 2022-12-01 NOTE — Telephone Encounter (Signed)
[facility] Clinical Social Work  Clinical Social Work was referred by medical provider for assessment of psychosocial needs.  Clinical Social Worker attempted to contact patient by phone to offer support and assess for needs. No answer, left vm with direct contact information.   Marguerita Merles, LCSWA Clinical Social Worker [facility]

## 2022-12-02 LAB — SURGICAL PATHOLOGY

## 2022-12-03 ENCOUNTER — Emergency Department (HOSPITAL_COMMUNITY)
Admission: EM | Admit: 2022-12-03 | Discharge: 2022-12-03 | Disposition: A | Discharge status: Home / Self Care | Payer: Medicaid Other | Attending: Emergency Medicine | Admitting: Emergency Medicine

## 2022-12-03 ENCOUNTER — Other Ambulatory Visit: Payer: Self-pay

## 2022-12-03 ENCOUNTER — Emergency Department (HOSPITAL_COMMUNITY): Payer: Medicaid Other

## 2022-12-03 ENCOUNTER — Ambulatory Visit (HOSPITAL_COMMUNITY): Admission: RE | Admit: 2022-12-03 | Payer: Medicaid Other | Source: Ambulatory Visit

## 2022-12-03 ENCOUNTER — Encounter (HOSPITAL_COMMUNITY): Payer: Self-pay

## 2022-12-03 DIAGNOSIS — M7989 Other specified soft tissue disorders: Secondary | ICD-10-CM | POA: Diagnosis not present

## 2022-12-03 DIAGNOSIS — S53104A Unspecified dislocation of right ulnohumeral joint, initial encounter: Secondary | ICD-10-CM | POA: Insufficient documentation

## 2022-12-03 DIAGNOSIS — W19XXXA Unspecified fall, initial encounter: Secondary | ICD-10-CM | POA: Insufficient documentation

## 2022-12-03 DIAGNOSIS — S52041A Displaced fracture of coronoid process of right ulna, initial encounter for closed fracture: Secondary | ICD-10-CM | POA: Insufficient documentation

## 2022-12-03 DIAGNOSIS — S42401A Unspecified fracture of lower end of right humerus, initial encounter for closed fracture: Secondary | ICD-10-CM

## 2022-12-03 DIAGNOSIS — S59901A Unspecified injury of right elbow, initial encounter: Secondary | ICD-10-CM | POA: Diagnosis present

## 2022-12-03 MED ORDER — HYDROMORPHONE HCL 1 MG/ML IJ SOLN
0.5000 mg | Freq: Once | INTRAMUSCULAR | Status: AC
  Administered 2022-12-03: 0.5 mg via INTRAMUSCULAR
  Filled 2022-12-03: qty 1

## 2022-12-03 MED ORDER — LORAZEPAM 1 MG PO TABS
1.0000 mg | ORAL_TABLET | Freq: Once | ORAL | Status: AC
  Administered 2022-12-03: 1 mg via ORAL
  Filled 2022-12-03: qty 1

## 2022-12-03 MED ORDER — HYDROCODONE-ACETAMINOPHEN 5-325 MG PO TABS
1.0000 | ORAL_TABLET | ORAL | 0 refills | Status: DC | PRN
Start: 2022-12-03 — End: 2022-12-10

## 2022-12-03 NOTE — Discharge Instructions (Addendum)
You were seen in the ER for evaluation of your elbow pain with fall. You do have fractures in your elbow. For this, you will have to follow up with orthopedic surgery as this may or may not need surgery. I have included the information for orthopedics into the discharge paperwork. You will need to call to schedule an appointment. Please leave the cast on until you are seen by orthopedics. Do not get this wet. I have included more information on cast care into the discharge paperwork. Please review. For pain, I recommend 650mg  of Tylenol every 6 hours as needed for pain. I am also sending you in some narcotic pain medication to take as needed for break through pain. Please do not take while driving or operating heavy machinery as it will make you sleepy. If you have any concerns, new or worsening symptoms, please return to the nearest ER for re-evaluation.

## 2022-12-03 NOTE — ED Provider Notes (Signed)
[place] EMERGENCY DEPARTMENT AT [facility] Provider Note   CSN: [id number] Arrival date & time: 12/03/22  4098     History  Chief Complaint  Patient presents with   Arm Injury    Donna [name] is a 57 y.o. female presents emerged from today for evaluation of right elbow/arm pain status post fall today.  Patient reports that she stepped in some "golf slobber" and slipped landing on her right elbow.  She reports that she is having pain in her right elbow since.  She denies any other injury.  Denies any blood thinner use.  She denies hitting her head.  She denies any head or neck pain.  She is ambulatory with a walker at baseline.  She denies any chest, abdomen, or back pain.  She denies any numbness or tingling in the area.   Arm Injury      Home Medications Prior to Admission medications   Medication Sig Start Date End Date Taking? Authorizing Provider  buPROPion (WELLBUTRIN) 75 MG tablet Take 75 mg by mouth 2 (two) times daily. 05/08/22   [provider]  clonazePAM (KLONOPIN) 1 MG tablet Take 1 tablet (1 mg total) by mouth 2 (two) times daily. Patient taking differently: Take 1 mg by mouth daily. 12/26/21   Anyanwu, Jethro Bastos, MD  nitroGLYCERIN (NITROLINGUAL) 0.4 MG/SPRAY spray Place 1 spray under the tongue every 5 (five) minutes x 3 doses as needed for chest pain.    [provider]  olmesartan-hydrochlorothiazide (BENICAR HCT) 20-12.5 MG tablet Take 1 tablet by mouth daily.    [provider]  solifenacin (VESICARE) 10 MG tablet Take 1 tablet (10 mg total) by mouth daily. Patient not taking: Reported on 10/21/2022 04/10/22   Marguerita Beards, MD      Allergies    Celebrex [celecoxib], Ibuprofen, and Oxycontin [oxycodone hcl]    Review of Systems   Review of Systems  Physical Exam Updated Vital Signs BP (!) 158/92 (BP Location: Left Arm)   Pulse 83   Temp 98 F (36.7 C) (Oral)   Resp 17   Ht 5\' 8"  (1.727 m)   Wt (!) 146.5 kg    SpO2 100%   BMI 49.11 kg/m  Physical Exam Vitals and nursing note reviewed.  Constitutional:      General: She is not in acute distress.    Appearance: She is not toxic-appearing.  HENT:     Mouth/Throat:     Mouth: Mucous membranes are moist.  Eyes:     General: No scleral icterus. Pulmonary:     Effort: Pulmonary effort is normal. No respiratory distress.  Abdominal:     Tenderness: There is no abdominal tenderness. There is no guarding or rebound.  Musculoskeletal:        General: Tenderness present.     Cervical back: Normal range of motion.     Comments: Right upper extremity held in a flexed addition at the elbow the patient.  Guarding.  No obvious deformity.  Compartments are soft throughout.  Palpable radial pulses.  Brisk cap refill present in all 5 fingers.  She will wiggle her fingers however does experience pain in the elbow.  She has no pain upon palpation of the shoulder however going more distally she does start to have pain closer to the elbow as well as with the forearm, is having pain closer to the elbow.  No pain in her wrist with flexion extension of it but does have pain again  at the elbow.  No snuffbox tenderness.  Sensation reportedly intact by patient.  Coloration and temperature appear symmetric.  No abrasion or laceration.  Skin:    General: Skin is warm and dry.  Neurological:     Mental Status: She is alert.     ED Results / Procedures / Treatments   Labs (all labs ordered are listed, but only abnormal results are displayed) Labs Reviewed - No data to display  EKG None  Radiology DG Humerus Right  Result Date: 12/03/2022 CLINICAL DATA:  Fall today.  Elbow and wrist pain. EXAM: RIGHT ELBOW - COMPLETE 3+ VIEW; RIGHT WRIST - COMPLETE 3+ VIEW; RIGHT HUMERUS - 2+ VIEW COMPARISON:  Similar prior radiographs 04/12/2018. FINDINGS: Elbow radiographs demonstrate a joint effusion with an intra-articular fracture involving the coronoid process. Additional  osseous fragment is noted lateral to the lateral humeral epicondyle, new from prior radiographs and possibly reflecting an additional acute fracture. There is no dislocation. The radial head is suboptimally evaluated on these views, but grossly intact. The proximal humerus appears intact. There is no glenohumeral dislocation. Mild acromioclavicular degenerative changes. No evidence of acute fracture or dislocation at the right wrist. Stable mild intraosseous cysts within the carpal bones. IMPRESSION: 1. Intra-articular fracture of the coronoid process of the ulna with associated elbow joint effusion. 2. Possible additional fracture of the lateral humeral epicondyle. Consider CT of the right elbow for further evaluation. 3. No evidence of acute fracture or dislocation at the right shoulder or wrist. Electronically Signed   By: Carey Bullocks M.D.   On: 12/03/2022 11:07   DG Elbow Complete Right  Result Date: 12/03/2022 CLINICAL DATA:  Fall today.  Elbow and wrist pain. EXAM: RIGHT ELBOW - COMPLETE 3+ VIEW; RIGHT WRIST - COMPLETE 3+ VIEW; RIGHT HUMERUS - 2+ VIEW COMPARISON:  Similar prior radiographs 04/12/2018. FINDINGS: Elbow radiographs demonstrate a joint effusion with an intra-articular fracture involving the coronoid process. Additional osseous fragment is noted lateral to the lateral humeral epicondyle, new from prior radiographs and possibly reflecting an additional acute fracture. There is no dislocation. The radial head is suboptimally evaluated on these views, but grossly intact. The proximal humerus appears intact. There is no glenohumeral dislocation. Mild acromioclavicular degenerative changes. No evidence of acute fracture or dislocation at the right wrist. Stable mild intraosseous cysts within the carpal bones. IMPRESSION: 1. Intra-articular fracture of the coronoid process of the ulna with associated elbow joint effusion. 2. Possible additional fracture of the lateral humeral epicondyle. Consider  CT of the right elbow for further evaluation. 3. No evidence of acute fracture or dislocation at the right shoulder or wrist. Electronically Signed   By: Carey Bullocks M.D.   On: 12/03/2022 11:07   DG Wrist Complete Right  Result Date: 12/03/2022 CLINICAL DATA:  Fall today.  Elbow and wrist pain. EXAM: RIGHT ELBOW - COMPLETE 3+ VIEW; RIGHT WRIST - COMPLETE 3+ VIEW; RIGHT HUMERUS - 2+ VIEW COMPARISON:  Similar prior radiographs 04/12/2018. FINDINGS: Elbow radiographs demonstrate a joint effusion with an intra-articular fracture involving the coronoid process. Additional osseous fragment is noted lateral to the lateral humeral epicondyle, new from prior radiographs and possibly reflecting an additional acute fracture. There is no dislocation. The radial head is suboptimally evaluated on these views, but grossly intact. The proximal humerus appears intact. There is no glenohumeral dislocation. Mild acromioclavicular degenerative changes. No evidence of acute fracture or dislocation at the right wrist. Stable mild intraosseous cysts within the carpal bones. IMPRESSION: 1. Intra-articular fracture of  the coronoid process of the ulna with associated elbow joint effusion. 2. Possible additional fracture of the lateral humeral epicondyle. Consider CT of the right elbow for further evaluation. 3. No evidence of acute fracture or dislocation at the right shoulder or wrist. Electronically Signed   By: Carey Bullocks M.D.   On: 12/03/2022 11:07    Procedures Procedures   Medications Ordered in ED Medications  HYDROmorphone (DILAUDID) injection 0.5 mg (0.5 mg Intramuscular Given 12/03/22 1015)  LORazepam (ATIVAN) tablet 1 mg (1 mg Oral Given 12/03/22 1153)    ED Course/ Medical Decision Making/ A&P  Medical Decision Making Amount and/or Complexity of Data Reviewed Radiology: ordered.  Risk Prescription drug management.   57 y.o. female presents to the ER for evaluation of right elbow/arm pain sp fall.  Differential diagnosis includes but is not limited to trauma. Vital signs mildly elevated BP, otherwise unremarkable. Physical exam as noted above.   The patient is NV intact distally. Arm held in flexed position at the elbow. She denies any pain in her chest, abdomen, or back, She denies hitting her head, or any blood thinner.  I do not think any other imaging is needed at this time.  Will order her IM pain medication.  There is no overlying abrasion or laceration.  No potential for open fracture.  Patient reports that she is having anxiety, I see that this patient is on klonopin. Will order her Ativan here.   Patient is feeling better.   XR imaging shows 1. Intra-articular fracture of the coronoid process of the ulna with associated elbow joint effusion. 2. Possible additional fracture of the lateral humeral epicondyle. Consider CT of the right elbow for further evaluation. 3. No evidence of acute fracture or dislocation at the right shoulder or wrist.   My attending does not think this CT is emergently needed.   I consulted Charma Igo, PA-C from orthopedics. He does not think a CT is emergently needed. Recommends posterior long arm splint.   Splint and sling given. The patient sees [facility] already for her knee. Will give her follow up there.  The patient is neurovascularly intact after splint placement.  Will prescribe her some narcotic pain medication to use for breakthrough pain.  Discussed splint care and the importance of following up with orthopedics as this may possibly need surgery for repair.  She verbalizes her understanding and reports that she will call them later today.  Stable for discharge home with close orthopedic follow-up.  Patient's son will be driving her home.  We discussed the results of the labs/imaging. The plan is splint care, pain management, follow-up with orthopedics. We discussed strict return precautions and red flag symptoms. The patient verbalized their  understanding and agrees to the plan. The patient is stable and being discharged home in good condition.  Portions of this report may have been transcribed using voice recognition software. Every effort was made to ensure accuracy; however, inadvertent computerized transcription errors may be present.   Final Clinical Impression(s) / ED Diagnoses Final diagnoses:  Closed fracture dislocation of right elbow, initial encounter    Rx / DC Orders ED Discharge Orders          Ordered    HYDROcodone-acetaminophen (NORCO/VICODIN) 5-325 MG tablet  Every 4 hours PRN        12/03/22 1359              Achille Rich, PA-C 12/05/22 2209    Derwood Kaplan, MD 12/09/22 1813

## 2022-12-03 NOTE — Progress Notes (Signed)
Orthopedic Tech Progress Note Patient Details:  Donna [name] [birth year] [id number]  Ortho Devices Type of Ortho Device: Ace wrap, Cotton web roll, Shoulder immobilizer, Post (long arm) splint Ortho Device/Splint Location: right long arm posterior splint applied. right sling applied Ortho Device/Splint Interventions: Ordered, Application, Adjustment   Post Interventions Patient Tolerated: Well Instructions Provided: Care of device, Adjustment of device  Kizzie Fantasia 12/03/2022, 1:22 PM

## 2022-12-03 NOTE — ED Triage Notes (Signed)
Pt fell today and landed on her right arm. Denies hitting head, LOC, or blood thinner use. Only endorses pain in right arm, says she is unable to move arm/wrist. Pt is able to articulate fingers.

## 2022-12-05 ENCOUNTER — Inpatient Hospital Stay: Payer: Medicaid Other

## 2022-12-05 NOTE — Progress Notes (Signed)
[facility] Clinical Social Work  Clinical Social Work was referred by medical provider for assessment of psychosocial needs.  Clinical Social Worker contacted patient by phone to offer support and assess for needs.  Patient was verbally distressed (crying) about concerns with financial situation related to utilities. Patient explored [employer] and reported being denied due to her not having employment. CSW provider verbal support to patient's expressed experiences and sent known resources to patient to assist with utilities via email with patient's verbal authorization.   Marguerita Merles, LCSWA Clinical Social Worker [facility]

## 2022-12-09 ENCOUNTER — Encounter (HOSPITAL_COMMUNITY): Payer: Self-pay | Admitting: Orthopedic Surgery

## 2022-12-09 NOTE — Progress Notes (Signed)
SDW call  Patient was given pre-op instructions over the phone. Patient verbalized understanding of instructions provided.     PCP - Molinda Bailiff, PA Cardiologist - Dr. Rosita Kea Pulmonary:    PPM/ICD - denies Device Orders - na Rep Notified - na   Chest x-ray - na EKG -  03/04/2022 Stress Test - ECHO - States she had one 2024 at [facility], but unable to find evidence in [facility] Cardiac Cath -   Sleep Study/sleep apnea/CPAP: denies  Non-diabetic  Blood Thinner Instructions: denies  Aspirin Instructions:denies   ERAS Protcol - Clear fluids until 1400   COVID TEST- na    Anesthesia review: Yes. HTN, angina, sees Cardiology   Patient denies shortness of breath, fever, cough and chest pain over the phone call  Your procedure is scheduled on Wednesday December 10, 2022  Report to [facility] Main Entrance "A" at  1430  A.M., then check in with the Admitting office.  Call this number if you have problems the morning of surgery:  [phone]   If you have any questions prior to your surgery date call [phone]: Open Monday-Friday 8am-4pm If you experience any cold or flu symptoms such as cough, fever, chills, shortness of breath, etc. between now and your scheduled surgery, please notify us at the above number     Remember:  Do not eat after midnight the night before your surgery  You may drink clear liquids until 1400  the morning of your surgery.   Clear liquids allowed are: Water, Non-Citrus Juices (without pulp), Carbonated Beverages, Clear Tea, Black Coffee ONLY (NO MILK, CREAM OR POWDERED CREAMER of any kind), and Gatorade   Take these medicines the morning of surgery with A SIP OF WATER:  Wellbutrin, klonopin  As of today, STOP taking any Aspirin (unless otherwise instructed by your surgeon) Aleve, Naproxen, Ibuprofen, Motrin, Advil, Goody's, BC's, all herbal medications, fish oil, and all vitamins.

## 2022-12-10 ENCOUNTER — Ambulatory Visit (HOSPITAL_COMMUNITY): Payer: Medicaid Other | Admitting: Physician Assistant

## 2022-12-10 ENCOUNTER — Other Ambulatory Visit: Payer: Self-pay

## 2022-12-10 ENCOUNTER — Encounter (HOSPITAL_COMMUNITY): Payer: Self-pay | Admitting: Orthopedic Surgery

## 2022-12-10 ENCOUNTER — Ambulatory Visit (HOSPITAL_COMMUNITY): Payer: Medicaid Other

## 2022-12-10 ENCOUNTER — Encounter (HOSPITAL_COMMUNITY)
Admission: RE | Disposition: A | Discharge status: Home / Self Care | Payer: Self-pay | Source: Home / Self Care | Attending: Orthopedic Surgery

## 2022-12-10 ENCOUNTER — Ambulatory Visit (HOSPITAL_COMMUNITY)
Admission: RE | Admit: 2022-12-10 | Discharge: 2022-12-10 | Disposition: A | Discharge status: Home / Self Care | Payer: Medicaid Other | Attending: Orthopedic Surgery | Admitting: Orthopedic Surgery

## 2022-12-10 DIAGNOSIS — S52121A Displaced fracture of head of right radius, initial encounter for closed fracture: Secondary | ICD-10-CM | POA: Insufficient documentation

## 2022-12-10 DIAGNOSIS — S53104A Unspecified dislocation of right ulnohumeral joint, initial encounter: Secondary | ICD-10-CM | POA: Diagnosis not present

## 2022-12-10 DIAGNOSIS — S42401A Unspecified fracture of lower end of right humerus, initial encounter for closed fracture: Secondary | ICD-10-CM | POA: Insufficient documentation

## 2022-12-10 DIAGNOSIS — M797 Fibromyalgia: Secondary | ICD-10-CM | POA: Diagnosis not present

## 2022-12-10 DIAGNOSIS — S53104D Unspecified dislocation of right ulnohumeral joint, subsequent encounter: Secondary | ICD-10-CM

## 2022-12-10 DIAGNOSIS — M1990 Unspecified osteoarthritis, unspecified site: Secondary | ICD-10-CM | POA: Diagnosis not present

## 2022-12-10 DIAGNOSIS — W19XXXA Unspecified fall, initial encounter: Secondary | ICD-10-CM | POA: Insufficient documentation

## 2022-12-10 DIAGNOSIS — S53441A Ulnar collateral ligament sprain of right elbow, initial encounter: Secondary | ICD-10-CM | POA: Diagnosis not present

## 2022-12-10 DIAGNOSIS — Z6841 Body Mass Index (BMI) 40.0 and over, adult: Secondary | ICD-10-CM | POA: Diagnosis not present

## 2022-12-10 DIAGNOSIS — I10 Essential (primary) hypertension: Secondary | ICD-10-CM | POA: Diagnosis not present

## 2022-12-10 DIAGNOSIS — F418 Other specified anxiety disorders: Secondary | ICD-10-CM | POA: Insufficient documentation

## 2022-12-10 DIAGNOSIS — I209 Angina pectoris, unspecified: Secondary | ICD-10-CM | POA: Diagnosis not present

## 2022-12-10 DIAGNOSIS — Z79899 Other long term (current) drug therapy: Secondary | ICD-10-CM | POA: Diagnosis not present

## 2022-12-10 HISTORY — DX: Angina pectoris, unspecified: I20.9

## 2022-12-10 HISTORY — PX: ORIF ELBOW FRACTURE: SHX5031

## 2022-12-10 LAB — BASIC METABOLIC PANEL
Anion gap: 9 (ref 5–15)
BUN: 8 mg/dL (ref 6–20)
CO2: 25 mmol/L (ref 22–32)
Calcium: 8.8 mg/dL — ABNORMAL LOW (ref 8.9–10.3)
Chloride: 104 mmol/L (ref 98–111)
Creatinine, Ser: 0.71 mg/dL (ref 0.44–1.00)
GFR, Estimated: >60 mL/min (ref >60)
Glucose, Bld: 99 mg/dL (ref 70–99)
Potassium: 3.9 mmol/L (ref 3.5–5.1)
Sodium: 138 mmol/L (ref 135–145)

## 2022-12-10 LAB — CBC
HCT: 40.1 % (ref 36.0–46.0)
Hemoglobin: 13 g/dL (ref 12.0–15.0)
MCH: 28.3 pg (ref 26.0–34.0)
MCHC: 32.4 g/dL (ref 30.0–36.0)
MCV: 87.4 fL (ref 80.0–100.0)
Platelets: 392 10*3/uL (ref 150–400)
RBC: 4.59 MIL/uL (ref 3.87–5.11)
RDW: 14.4 % (ref 11.5–15.5)
WBC: 8.8 10*3/uL (ref 4.0–10.5)
nRBC: 0 % (ref 0.0–0.2)

## 2022-12-10 SURGERY — OPEN REDUCTION INTERNAL FIXATION (ORIF) ELBOW/OLECRANON FRACTURE
Anesthesia: Monitor Anesthesia Care | Site: Elbow | Laterality: Right

## 2022-12-10 MED ORDER — MIDAZOLAM HCL 2 MG/2ML IJ SOLN
INTRAMUSCULAR | Status: AC
  Administered 2022-12-10: 2 mg via INTRAVENOUS
  Filled 2022-12-10: qty 2

## 2022-12-10 MED ORDER — CEFAZOLIN IN SODIUM CHLORIDE 3-0.9 GM/100ML-% IV SOLN
3.0000 g | INTRAVENOUS | Status: AC
  Administered 2022-12-10: 3 g via INTRAVENOUS
  Filled 2022-12-10: qty 100

## 2022-12-10 MED ORDER — SUGAMMADEX SODIUM 200 MG/2ML IV SOLN
INTRAVENOUS | Status: DC | PRN
  Administered 2022-12-10 (×2): 200 mg via INTRAVENOUS

## 2022-12-10 MED ORDER — ONDANSETRON HCL 4 MG/2ML IJ SOLN
INTRAMUSCULAR | Status: DC | PRN
  Administered 2022-12-10: 4 mg via INTRAVENOUS

## 2022-12-10 MED ORDER — FENTANYL CITRATE (PF) 100 MCG/2ML IJ SOLN: 50.0000 ug | Freq: Once | INTRAMUSCULAR | Status: AC

## 2022-12-10 MED ORDER — DEXAMETHASONE SODIUM PHOSPHATE 10 MG/ML IJ SOLN
INTRAMUSCULAR | Status: DC | PRN
  Administered 2022-12-10: 10 mg via INTRAVENOUS

## 2022-12-10 MED ORDER — LIDOCAINE 2% (20 MG/ML) 5 ML SYRINGE
INTRAMUSCULAR | Status: DC | PRN
  Administered 2022-12-10: 40 mg via INTRAVENOUS

## 2022-12-10 MED ORDER — FENTANYL CITRATE (PF) 250 MCG/5ML IJ SOLN
INTRAMUSCULAR | Status: AC
  Filled 2022-12-10: qty 5

## 2022-12-10 MED ORDER — DROPERIDOL 2.5 MG/ML IJ SOLN: 0.6250 mg | Freq: Once | INTRAMUSCULAR | Status: DC | PRN

## 2022-12-10 MED ORDER — ACETAMINOPHEN 10 MG/ML IV SOLN
INTRAVENOUS | Status: AC
  Filled 2022-12-10: qty 100

## 2022-12-10 MED ORDER — PROPOFOL 1000 MG/100ML IV EMUL
INTRAVENOUS | Status: AC
  Filled 2022-12-10: qty 200

## 2022-12-10 MED ORDER — CEFAZOLIN SODIUM 1 G IJ SOLR
INTRAMUSCULAR | Status: AC
  Filled 2022-12-10: qty 10

## 2022-12-10 MED ORDER — ROCURONIUM BROMIDE 10 MG/ML (PF) SYRINGE
PREFILLED_SYRINGE | INTRAVENOUS | Status: DC | PRN
  Administered 2022-12-10: 35 mg via INTRAVENOUS

## 2022-12-10 MED ORDER — BUPIVACAINE-EPINEPHRINE (PF) 0.5% -1:200000 IJ SOLN
INTRAMUSCULAR | Status: DC | PRN
Start: 2022-12-10 — End: 2022-12-10
  Administered 2022-12-10: 30 mL via PERINEURAL

## 2022-12-10 MED ORDER — POVIDONE-IODINE 7.5 % EX SOLN: Freq: Once | CUTANEOUS | Status: DC

## 2022-12-10 MED ORDER — MIDAZOLAM HCL 2 MG/2ML IJ SOLN: 2.0000 mg | Freq: Once | INTRAMUSCULAR | Status: AC

## 2022-12-10 MED ORDER — FENTANYL CITRATE (PF) 100 MCG/2ML IJ SOLN
25.0000 ug | INTRAMUSCULAR | Status: DC | PRN
  Administered 2022-12-10: 25 ug via INTRAVENOUS

## 2022-12-10 MED ORDER — PHENYLEPHRINE HCL-NACL 20-0.9 MG/250ML-% IV SOLN
INTRAVENOUS | Status: AC
  Filled 2022-12-10: qty 250

## 2022-12-10 MED ORDER — CHLORHEXIDINE GLUCONATE 0.12 % MT SOLN: 15.0000 mL | Freq: Once | OROMUCOSAL | Status: AC

## 2022-12-10 MED ORDER — PROPOFOL 10 MG/ML IV BOLUS
INTRAVENOUS | Status: DC | PRN
  Administered 2022-12-10: 200 mg via INTRAVENOUS

## 2022-12-10 MED ORDER — ACETAMINOPHEN 10 MG/ML IV SOLN
1000.0000 mg | Freq: Once | INTRAVENOUS | Status: DC | PRN
  Administered 2022-12-10: 1000 mg via INTRAVENOUS

## 2022-12-10 MED ORDER — CHLORHEXIDINE GLUCONATE 0.12 % MT SOLN
OROMUCOSAL | Status: AC
  Administered 2022-12-10: 15 mL via OROMUCOSAL
  Filled 2022-12-10: qty 15

## 2022-12-10 MED ORDER — FENTANYL CITRATE (PF) 100 MCG/2ML IJ SOLN
INTRAMUSCULAR | Status: AC
  Administered 2022-12-10: 50 ug via INTRAVENOUS
  Filled 2022-12-10: qty 2

## 2022-12-10 MED ORDER — PROPOFOL 10 MG/ML IV BOLUS
INTRAVENOUS | Status: AC
  Filled 2022-12-10: qty 20

## 2022-12-10 MED ORDER — FENTANYL CITRATE (PF) 100 MCG/2ML IJ SOLN
INTRAMUSCULAR | Status: AC
  Filled 2022-12-10: qty 2

## 2022-12-10 MED ORDER — ORAL CARE MOUTH RINSE: 15.0000 mL | Freq: Once | OROMUCOSAL | Status: AC

## 2022-12-10 MED ORDER — SUCCINYLCHOLINE CHLORIDE 200 MG/10ML IV SOSY
PREFILLED_SYRINGE | INTRAVENOUS | Status: DC | PRN
  Administered 2022-12-10: 150 mg via INTRAVENOUS

## 2022-12-10 MED ORDER — DEXMEDETOMIDINE HCL IN NACL 80 MCG/20ML IV SOLN
INTRAVENOUS | Status: AC
  Filled 2022-12-10: qty 20

## 2022-12-10 MED ORDER — FENTANYL CITRATE (PF) 250 MCG/5ML IJ SOLN
INTRAMUSCULAR | Status: DC | PRN
  Administered 2022-12-10: 75 ug via INTRAVENOUS

## 2022-12-10 MED ORDER — LACTATED RINGERS IV SOLN: INTRAVENOUS | Status: DC

## 2022-12-10 MED ORDER — 0.9 % SODIUM CHLORIDE (POUR BTL) OPTIME
TOPICAL | Status: DC | PRN
Start: 2022-12-10 — End: 2022-12-10
  Administered 2022-12-10: 1000 mL

## 2022-12-10 SURGICAL SUPPLY — 69 items
ANCH SUT 2 SHRT 1.45 DRLBT (Anchor) ×2 IMPLANT
ANCHOR JUGGERKNOT W/DRL 2/1.45 (Anchor) IMPLANT
BAG COUNTER SPONGE SURGICOUNT (BAG) ×1 IMPLANT
BAG SPNG CNTER NS LX DISP (BAG) ×1
BNDG CMPR 5X3 KNIT ELC UNQ LF (GAUZE/BANDAGES/DRESSINGS) ×3
BNDG CMPR 5X4 KNIT ELC UNQ LF (GAUZE/BANDAGES/DRESSINGS) ×1
BNDG CMPR 9X4 STRL LF SNTH (GAUZE/BANDAGES/DRESSINGS)
BNDG COHESIVE 4X5 TAN STRL (GAUZE/BANDAGES/DRESSINGS) ×1 IMPLANT
BNDG ELASTIC 3INX 5YD STR LF (GAUZE/BANDAGES/DRESSINGS) IMPLANT
BNDG ELASTIC 4INX 5YD STR LF (GAUZE/BANDAGES/DRESSINGS) IMPLANT
BNDG ELASTIC 4X5.8 VLCR STR LF (GAUZE/BANDAGES/DRESSINGS) IMPLANT
BNDG ESMARK 4X9 LF (GAUZE/BANDAGES/DRESSINGS) ×1 IMPLANT
BNDG GAUZE DERMACEA FLUFF 4 (GAUZE/BANDAGES/DRESSINGS) IMPLANT
BNDG GZE DERMACEA 4 6PLY (GAUZE/BANDAGES/DRESSINGS) ×3
CORD BIPOLAR FORCEPS 12FT (ELECTRODE) ×1 IMPLANT
COVER MAYO STAND STRL (DRAPES) ×1 IMPLANT
COVER SURGICAL LIGHT HANDLE (MISCELLANEOUS) ×1 IMPLANT
CUFF TOURN SGL QUICK 18X4 (TOURNIQUET CUFF) ×1 IMPLANT
CUFF TOURN SGL QUICK 24 (TOURNIQUET CUFF)
CUFF TRNQT CYL 24X4X16.5-23 (TOURNIQUET CUFF) IMPLANT
DRAPE INCISE IOBAN 66X45 STRL (DRAPES) ×1 IMPLANT
DRAPE OEC MINIVIEW 54X84 (DRAPES) IMPLANT
DRAPE ORTHO SPLIT 77X108 STRL (DRAPES) ×2
DRAPE SURG ORHT 6 SPLT 77X108 (DRAPES) ×2 IMPLANT
DRAPE U-SHAPE 47X51 STRL (DRAPES) ×1 IMPLANT
DRSG ADAPTIC 3X8 NADH LF (GAUZE/BANDAGES/DRESSINGS) IMPLANT
GAUZE SPONGE 4X4 12PLY STRL (GAUZE/BANDAGES/DRESSINGS) IMPLANT
GAUZE XEROFORM 5X9 LF (GAUZE/BANDAGES/DRESSINGS) ×1 IMPLANT
GLOVE BIOGEL PI IND STRL 8.5 (GLOVE) ×1 IMPLANT
GLOVE SURG ORTHO 8.0 STRL STRW (GLOVE) ×1 IMPLANT
GOWN STRL REUS W/ TWL LRG LVL3 (GOWN DISPOSABLE) ×2 IMPLANT
GOWN STRL REUS W/ TWL XL LVL3 (GOWN DISPOSABLE) ×1 IMPLANT
GOWN STRL REUS W/TWL LRG LVL3 (GOWN DISPOSABLE) ×2
GOWN STRL REUS W/TWL XL LVL3 (GOWN DISPOSABLE) ×1
KIT BASIN OR (CUSTOM PROCEDURE TRAY) ×1 IMPLANT
KIT TURNOVER KIT B (KITS) ×1 IMPLANT
LOOP VASCLR MAXI BLUE 18IN ST (MISCELLANEOUS) IMPLANT
LOOP VASCULAR MAXI 18 BLUE (MISCELLANEOUS)
LOOPS VASCLR MAXI BLUE 18IN ST (MISCELLANEOUS) IMPLANT
MANIFOLD NEPTUNE II (INSTRUMENTS) ×1 IMPLANT
NDL HYPO 25GX1X1/2 BEV (NEEDLE) IMPLANT
NEEDLE HYPO 25GX1X1/2 BEV (NEEDLE) IMPLANT
NS IRRIG 1000ML POUR BTL (IV SOLUTION) ×1 IMPLANT
PACK ORTHO EXTREMITY (CUSTOM PROCEDURE TRAY) ×1 IMPLANT
PAD ARMBOARD 7.5X6 YLW CONV (MISCELLANEOUS) ×2 IMPLANT
PAD CAST 3X4 CTTN HI CHSV (CAST SUPPLIES) IMPLANT
PAD CAST 4YDX4 CTTN HI CHSV (CAST SUPPLIES) IMPLANT
PADDING CAST COTTON 3X4 STRL (CAST SUPPLIES) ×3
PADDING CAST COTTON 4X4 STRL (CAST SUPPLIES) ×1
SOAP 2 % CHG 4 OZ (WOUND CARE) ×1 IMPLANT
SPLINT FIBERGLASS 3X35 (CAST SUPPLIES) IMPLANT
SUCTION TUBE FRAZIER 10FR DISP (SUCTIONS) IMPLANT
SUT PROLENE 3 0 PS 2 (SUTURE) IMPLANT
SUT PROLENE 4 0 PS 2 18 (SUTURE) IMPLANT
SUT VIC AB 0 CT1 27 (SUTURE) ×1
SUT VIC AB 0 CT1 27XBRD ANBCTR (SUTURE) IMPLANT
SUT VIC AB 2-0 CT1 27 (SUTURE)
SUT VIC AB 2-0 CT1 TAPERPNT 27 (SUTURE) IMPLANT
SUT VIC AB 3-0 CT1 27 (SUTURE) ×1
SUT VIC AB 3-0 CT1 TAPERPNT 27 (SUTURE) IMPLANT
SUT VICRYL 4-0 PS2 18IN ABS (SUTURE) IMPLANT
SYR BULB IRRIG 60ML STRL (SYRINGE) IMPLANT
SYR CONTROL 10ML LL (SYRINGE) IMPLANT
TOWEL GREEN STERILE (TOWEL DISPOSABLE) ×2 IMPLANT
TOWEL GREEN STERILE FF (TOWEL DISPOSABLE) ×1 IMPLANT
TUBE CONNECTING 12X1/4 (SUCTIONS) IMPLANT
UNDERPAD 30X36 HEAVY ABSORB (UNDERPADS AND DIAPERS) ×1 IMPLANT
VASCULAR TIE MAXI BLUE 18IN ST (MISCELLANEOUS)
WATER STERILE IRR 1000ML POUR (IV SOLUTION) ×1 IMPLANT
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
## 2022-12-10 NOTE — Anesthesia Preprocedure Evaluation (Addendum)
Anesthesia Evaluation  Patient identified by MRN, date of birth, ID band Patient awake    Reviewed: Allergy & Precautions, NPO status , Patient's Chart, lab work & pertinent test results  History of Anesthesia Complications Negative for: history of anesthetic complications  Airway Mallampati: II  TM Distance: >3 FB Neck ROM: Full    Dental  (+) Teeth Intact, Dental Advisory Given   Pulmonary neg pulmonary ROS   breath sounds clear to auscultation       Cardiovascular hypertension, Pt. on medications + angina  + DOE   Rhythm:Regular Rate:Normal     Neuro/Psych  Headaches  Anxiety Depression       GI/Hepatic Neg liver ROS,GERD  ,,  Endo/Other    Morbid obesity (BMI 50)  Renal/GU negative Renal ROS     Musculoskeletal  (+) Arthritis ,  Fibromyalgia -Right elbow dislocation with displaced radial head and coronoid fracture   Abdominal   Peds  Hematology negative hematology ROS (+)   Anesthesia Other Findings Day of surgery medications reviewed with patient.  Reproductive/Obstetrics                             Anesthesia Physical Anesthesia Plan  ASA: 4  Anesthesia Plan: General   Post-op Pain Management: Regional block*   Induction: Intravenous  PONV Risk Score and Plan: 4 or greater and Ondansetron, Midazolam, Dexamethasone and Scopolamine patch - Pre-op  Airway Management Planned: Oral ETT and Video Laryngoscope Planned  Additional Equipment: None  Intra-op Plan:   Post-operative Plan: Extubation in OR  Informed Consent: I have reviewed the patients History and Physical, chart, labs and discussed the procedure including the risks, benefits and alternatives for the proposed anesthesia with the patient or authorized representative who has indicated his/her understanding and acceptance.     Dental advisory given  Plan Discussed with: CRNA  Anesthesia Plan Comments:         Anesthesia Quick Evaluation

## 2022-12-10 NOTE — Discharge Instructions (Signed)
KEEP BANDAGE CLEAN AND DRY CALL OFFICE FOR F/U APPT [id number] in 15 days RX sent to [facility] KEEP HAND ELEVATED ABOVE HEART OK TO APPLY ICE TO OPERATIVE AREA CONTACT OFFICE IF ANY WORSENING PAIN OR CONCERNS.

## 2022-12-10 NOTE — Op Note (Signed)
PREOPERATIVE DIAGNOSIS: Right elbow dislocation Right elbow coronoid fracture Right elbow proximal radius fracture radial head Right elbow lateral ulnar collateral ligament tear  POSTOPERATIVE DIAGNOSIS: Same  ATTENDING SURGEON: Dr. Bradly Bienenstock who scrubbed and present for the entire procedure  ASSISTANT SURGEON: None  ANESTHESIA: Regional block with general anesthesia via LMA  OPERATIVE PROCEDURE: Open treatment of right elbow dislocation Open treatment of right elbow coronoid fracture without internal fixation Open treatment of right proximal radial head fracture with excision of radial head fragments Right elbow lateral ulnar collateral ligament primary repair. Radiographs 3 views right elbow  IMPLANTS: 2 Biomet juggernaut anchors  EBL: Minimal  RADIOGRAPHIC INTERPRETATION: AP lateral oblique views of the elbow do show the reduced ulnohumeral joint with good alignment of the radiocapitellar joint with a well aligned coronoid fracture  SURGICAL INDICATIONS: Patient is a 57 year old morbidly obese female who sustained a closed injury to her right elbow.  Patient was seen evaluate in the office and recommend undergo the above procedure.  The risks of surgery include but not limited to bleeding infection damage nearby nerves arteries or tendons loss of motion of the wrist and digits incomplete relief of symptoms and need for further surgical invention.  Signed informed consent was obtained on the day of surgery.  SURGICAL TECHNIQUE: Patient was palpated fine in the preoperative holding area marked apart a marker made on the right elbow to indicate correct operative site.  Patient then brought back the operating placed supine on the anesthesia table where the general anesthetic was administered.  Patient tolerates well.  A well-padded tourniquet placed on the right brachium and stay with the appropriate drape.  The right upper extremities then prepped and draped normal sterile fashion.  A  timeout was called the correct site was identified procedure then began.  Preoperative antibiotics were given prior to skin incision.  A curvilinear incision made directly over the lateral aspect of the elbow.  Dissection carried down through the skin and subcutaneous tissue.  Subcutaneous veins were cauterized using the electrocautery.  Deep dissection carried down to the fascial layer where the extensor interval was incised longitudinally exposing the joint.  The large heme arthrosis was then evacuated.  Multiple fragments from the radial head were then evacuated and open treatment of the radial head fracture was done with excision of the loose chondral fragments.  This was a shearing type fracture of the anterior quarter of the radial head.  The remaining portion of the radial head looks to be in good quality and did not feel necessary for radial head arthroplasty.  It was a chondral shearing fracture with multiple fragments within the joint.  After evacuation of the loose fragments and open treatment of the proximal radius fracture attention was then turned to the coronoid fracture visualization of the coronoid was then done through the lateral column this was well-visualized and was then felt to be in good position.  Open reduction was then performed.  Following placement of the coronoid fracture attention was then turned to primary repair of the collateral ligament complex.  The patient did have a posterior comminution along the distal humerus.  The small fragments were then removed.  Following this the Biomet suture anchors were then placed along the lateral column posteriorly 1 slight anteriorly.  Sutures were then delivered through the lateral collateral ulnar collateral and complex and tied down nicely to bone.  This was then reinforced in a pants over vest type fashion with the extensor mechanism.  The wound was  then copiously irrigated.  The extensor interval was then closed with 0 Vicryl suture.   Following this the subcutaneous tissues were then closed with 2-0 Vicryl and 3-0 Vicryl suture.  Final radiographs were then obtained.  The skin was then closed using simple Prolene sutures.  Adaptic dressing sterile compressive bandage then applied.  The patient was placed in a well-padded long-arm splint keeping the forearm in a pronated position position of more stability elbow approximately 90 degrees of flexion.  Patient was then extubated taken recovery room in good condition.  POSTOPERATIVE PLAN: Patient discharged home.  See him back in the office in approximately 2 weeks for wound check x-rays making sure we do not bring the elbow into extension keeping the elbow flexed AP and lateral views of the elbow and of the distal humerus.  The patient will then be sent down to see our therapist for a long-arm splint.  Radiographs at each visit.  Begin a postoperative ulnar collateral, lateral ulnar collateral ligament repair protocol.

## 2022-12-10 NOTE — Anesthesia Procedure Notes (Signed)
Procedure Name: Intubation Date/Time: 12/10/2022 5:05 PM  Performed by: Darlina Guys, CRNAPre-anesthesia Checklist: Patient identified, Emergency Drugs available, Suction available and Patient being monitored Patient Re-evaluated:Patient Re-evaluated prior to induction Oxygen Delivery Method: Circle System Utilized Preoxygenation: Pre-oxygenation with 100% oxygen Induction Type: IV induction Ventilation: Mask ventilation without difficulty Laryngoscope Size: Glidescope and 3 Grade View: Grade I Tube type: Oral Tube size: 7.0 mm Number of attempts: 1 Airway Equipment and Method: Stylet and Oral airway Placement Confirmation: ETT inserted through vocal cords under direct vision, positive ETCO2 and breath sounds checked- equal and bilateral Secured at: 21 cm Tube secured with: Tape Dental Injury: Teeth and Oropharynx as per pre-operative assessment  Comments: Patient mentioned in preop assessment that many of her teeth were newly loose (almost all). CRNA was very careful to avoid putting pressure on teeth during induction/intubation. G/S used for this purpose. Atraumatic intubation. Dentition and oral mucosa undamaged. All dentition as per preop.

## 2022-12-10 NOTE — Anesthesia Procedure Notes (Signed)
Anesthesia Regional Block: Supraclavicular block   Pre-Anesthetic Checklist: , timeout performed,  Correct Patient, Correct Site, Correct Laterality,  Correct Procedure, Correct Position, site marked,  Risks and benefits discussed,  Surgical consent,  Pre-op evaluation,  At surgeon's request and post-op pain management  Laterality: Right  Prep: chloraprep       Needles:  Injection technique: Single-shot  Needle Type: Echogenic Stimulator Needle     Needle Length: 9cm  Needle Gauge: 21     Additional Needles:   Procedures:,,,, ultrasound used (permanent image in chart),,    Narrative:  Start time: 12/10/2022 4:45 PM End time: 12/10/2022 4:50 PM Injection made incrementally with aspirations every 5 mL.  Performed by: Personally  Anesthesiologist: Shelton Silvas, MD  Additional Notes: Discussed risks and benefits of the nerve block in detail, including but not limited vascular injury, permanent nerve damage and infection.   Patient tolerated the procedure well. Local anesthetic introduced in an incremental fashion under minimal resistance after negative aspirations. No paresthesias were elicited. After completion of the procedure, no acute issues were identified and patient continued to be monitored by RN.

## 2022-12-10 NOTE — Transfer of Care (Signed)
Immediate Anesthesia Transfer of Care Note  Patient: Donna [name]  Procedure(s) Performed: RIGHT ELBOW OPEN REDUCTION AND INTERNAL FIXATION AND REPAIR AS INDICATED (Right: Elbow)  Patient Location: PACU  Anesthesia Type:General; Block  Level of Consciousness: awake and patient cooperative  Airway & Oxygen Therapy: Patient Spontanous Breathing and Patient connected to face mask oxygen  Post-op Assessment: Report given to RN and Post -op Vital signs reviewed and stable  Post vital signs: Reviewed and stable  Last Vitals:  Vitals Value Taken Time  BP 138/77 12/10/22 1845  Temp 36.2 C 12/10/22 1841  Pulse 82 12/10/22 1850  Resp 23 12/10/22 1850  SpO2 86 % 12/10/22 1850  Vitals shown include unfiled device data.  Last Pain:  Vitals:   12/10/22 1841  PainSc: 0-No pain      Patients Stated Pain Goal: 3 (12/10/22 1630)  Complications: No notable events documented.

## 2022-12-11 ENCOUNTER — Encounter (HOSPITAL_COMMUNITY): Payer: Self-pay | Admitting: Orthopedic Surgery

## 2022-12-11 ENCOUNTER — Telehealth: Payer: Self-pay | Admitting: *Deleted

## 2022-12-11 NOTE — Telephone Encounter (Signed)
Spoke with Donna [name] to check on how she has been since her office visit with Dr. Pricilla Holm on Friday, October 4th. Pt states she had surgery yesterday, pt states she slipped on her dogs slobber and fell and broke her left elbow.  When asked patient if social work reached out she said yes but they couldn't help with her electricity being shut off. (See social work note 10/11).   Pt was in a hurry because she was getting ready to go to an appointment in [place]. Pt states she has help with transportation and asked the office to call back tomorrow. Advised patient that office would call her back tomorrow.

## 2022-12-12 ENCOUNTER — Encounter (HOSPITAL_COMMUNITY): Payer: Self-pay | Admitting: Orthopedic Surgery

## 2022-12-12 NOTE — Anesthesia Postprocedure Evaluation (Signed)
Anesthesia Post Note  Patient: Donna [name]  Procedure(s) Performed: RIGHT ELBOW OPEN REDUCTION AND INTERNAL FIXATION AND REPAIR AS INDICATED (Right: Elbow)     Patient location during evaluation: PACU Anesthesia Type: Regional and General Level of consciousness: sedated and patient cooperative Pain management: pain level controlled Vital Signs Assessment: post-procedure vital signs reviewed and stable Respiratory status: spontaneous breathing Cardiovascular status: stable Anesthetic complications: no   No notable events documented.  Last Vitals:  Vitals:   12/10/22 1915 12/10/22 1930  BP: (!) 144/82 (!) 140/77  Pulse: 78 80  Resp: 12 17  Temp:  (!) 36.2 C  SpO2: 97% 94%    Last Pain:  Vitals:   12/10/22 1900  PainSc: 10-Worst pain ever                 Lewie Loron

## 2022-12-12 NOTE — Telephone Encounter (Signed)
Spoke with Ms. Thede today. She states she is in a lot of pain and it's hard to sleep with her cast on her left arm.  Offered patient emotional support and suggestions about sleep positions. As she talked about her recent surgery and past back surgery pt was able to express her feelings and they are working on getting her some stronger pain medication. She also has another follow up with her surgeon in two weeks and they will remove the cast and put another type of cast on. Pt states her son is staying with her and is helping her with all her ADL's and has been a great support. Advised patient to call the office with any concerns or questions. Pt thanked the office for calling.

## 2022-12-17 NOTE — Plan of Care (Signed)
[facility] Tonsillectomy/Adenoidectomy, Postoperative PEDS care plan entered in error.

## 2022-12-23 ENCOUNTER — Ambulatory Visit: Payer: Medicaid Other | Admitting: Obstetrics and Gynecology

## 2022-12-24 ENCOUNTER — Ambulatory Visit: Payer: Medicaid Other | Admitting: Obstetrics and Gynecology

## 2023-01-02 ENCOUNTER — Ambulatory Visit (INDEPENDENT_AMBULATORY_CARE_PROVIDER_SITE_OTHER): Payer: Medicaid Other | Admitting: Obstetrics and Gynecology

## 2023-01-02 ENCOUNTER — Encounter: Payer: Self-pay | Admitting: Obstetrics and Gynecology

## 2023-01-02 VITALS — BP 100/66 | HR 94 | Ht 70.0 in | Wt 316.0 lb

## 2023-01-02 DIAGNOSIS — N393 Stress incontinence (female) (male): Secondary | ICD-10-CM | POA: Diagnosis not present

## 2023-01-02 DIAGNOSIS — N3281 Overactive bladder: Secondary | ICD-10-CM

## 2023-01-02 MED ORDER — MIRABEGRON ER 50 MG PO TB24
50.0000 mg | ORAL_TABLET | Freq: Every day | ORAL | 5 refills | Status: DC
Start: 2023-01-02 — End: 2023-12-09

## 2023-01-02 NOTE — Progress Notes (Signed)
[facility] Return Visit  SUBJECTIVE  History of Present Illness: Donna [name] is a 57 y.o. female seen in follow-up for mixed incontinence. Plan at last visit was was to start vesicare 10mg  daily, had previously also tried ditropan.   She is leaking/ voiding without awareness. She will also get a strong urge and not be able to hold it. Also has leakage with cough and sneeze.  She is drinking strawberry fanta (8oz), water, water with lemon per day.  She did try the vesicare and did not see improvement at all.   04/23/22: TRH/BSO, SLN biopsy on left, cystoscopy, repair of large right sidewall vaginal laceration  with Dr Pricilla Holm for endometrial cancer  Past Medical History: Patient  has a past medical history of Anemia, Anginal pain (HCC), Anxiety, Arthritis, Chronic back pain, CIN II (cervical intraepithelial neoplasia II) (08/2021), Depression, DOE (dyspnea on exertion), Edema of both lower extremities, Elevated blood pressure reading, Endometrial cancer (HCC), Fibromyalgia, GERD (gastroesophageal reflux disease), History of anemia, History of gastric ulcer, History of panic attacks, Hypertension, Migraines, Mixed stress and urge urinary incontinence, Pneumonia, PTSD (post-traumatic stress disorder), and Vaginal Pap smear, abnormal.   Past Surgical History: She  has a past surgical history that includes Appendectomy; Lumbar fusion; LEEP (N/A, 03/04/2022); Wisdom tooth extraction; Robotic assisted total hysterectomy with bilateral salpingo oophorectomy (N/A, 04/23/2022); Sentinel node biopsy (N/A, 04/23/2022); Robotic assisted lap vaginal hysterectomy (2024); and ORIF elbow fracture (Right, 12/10/2022).   Medications: She has a current medication list which includes the following prescription(s): bupropion, clonazepam, mirabegron er, olmesartan-hydrochlorothiazide, and nitroglycerin.   Allergies: Patient is allergic to celebrex [celecoxib], ibuprofen, and oxycontin [oxycodone hcl].    Social History: Patient  reports that she has never smoked. She has never used smokeless tobacco. She reports that she does not drink alcohol and does not use drugs.      OBJECTIVE     Physical Exam: Vitals:   01/02/23 1433  BP: 100/66  Pulse: 94  Weight: (!) 316 lb (143.3 kg)  Height: 5\' 10"  (1.778 m)  Body mass index is 45.34 kg/m.  Gen: No apparent distress, A&O x 3.  Detailed Urogynecologic Evaluation:  Deferred.    ASSESSMENT AND PLAN    [name] is a 57 y.o. with:  1. Overactive bladder   2. SUI (stress urinary incontinence, female)    - We discussed options for OAB including pelvic PT, medication, intravesical botox, SNM or PTNS.  -She would like to try another medication at this time. Prescribed Myrbetriq 50mg  daily.  - For SUI, reviewed options of urethral bulking and a sling. She has lost about 20 lbs since the beginning of the year and is working on further weight loss. We discussed that BMI closer to 40 would be more optimal for a sling.  - She wants to avoid pelvic PT and surgery at this time due to multiple medical appts and recent broken arm, but may be interested in the future.   Return 2 months  Marguerita Beards, MD

## 2023-01-06 NOTE — Progress Notes (Signed)
[employer] has Approved patient's Mirabegron 50 mg ER. Ticket #[id number] ID #[id number] Start: 01/06/2023 - 01/06/2024

## 2023-02-13 LAB — MOLECULAR PATHOLOGY

## 2023-02-27 ENCOUNTER — Ambulatory Visit: Payer: Medicaid Other | Admitting: Obstetrics and Gynecology

## 2023-02-27 NOTE — Progress Notes (Deleted)
  U[facility] Return Visit  SUBJECTIVE  History of Present Illness: Donna [name] is a 58 y.o. female seen in follow-up for mixed incontinence. Plan at last visit was was to start vesicare  10mg  daily, had previously also tried ditropan.   She is leaking/ voiding without awareness. She will also get a strong urge and not be able to hold it. Also has leakage with cough and sneeze.  She is drinking strawberry fanta (8oz), water , water  with lemon per day.  She did try the vesicare  and did not see improvement at all.   04/23/22: TRH/BSO, SLN biopsy on left, cystoscopy, repair of large right sidewall vaginal laceration  with Dr Viktoria for endometrial cancer  Past Medical History: Patient  has a past medical history of Anemia, Anginal pain (HCC), Anxiety, Arthritis, Chronic back pain, CIN II (cervical intraepithelial neoplasia II) (08/2021), Depression, DOE (dyspnea on exertion), Edema of both lower extremities, Elevated blood pressure reading, Endometrial cancer (HCC), Fibromyalgia, GERD (gastroesophageal reflux disease), History of anemia, History of gastric ulcer, History of panic attacks, Hypertension, Migraines, Mixed stress and urge urinary incontinence, Pneumonia, PTSD (post-traumatic stress disorder), and Vaginal Pap smear, abnormal.   Past Surgical History: She  has a past surgical history that includes Appendectomy; Lumbar fusion; LEEP (N/A, 03/04/2022); Wisdom tooth extraction; Robotic assisted total hysterectomy with bilateral salpingo oophorectomy (N/A, 04/23/2022); Sentinel node biopsy (N/A, 04/23/2022); Robotic assisted lap vaginal hysterectomy (2024); and ORIF elbow fracture (Right, 12/10/2022).   Medications: She has a current medication list which includes the following prescription(s): bupropion , clonazepam , mirabegron  er, nitroglycerin, and olmesartan-hydrochlorothiazide.   Allergies: Patient is allergic to celebrex [celecoxib], ibuprofen , and oxycontin  [oxycodone  hcl].    Social History: Patient  reports that she has never smoked. She has never used smokeless tobacco. She reports that she does not drink alcohol and does not use drugs.      OBJECTIVE     Physical Exam: There were no vitals filed for this visit. There is no height or weight on file to calculate BMI.  Gen: No apparent distress, A&O x 3.  Detailed Urogynecologic Evaluation:  Deferred.    ASSESSMENT AND PLAN    Ms. Ronda is a 58 y.o. with:  No diagnosis found.  - We discussed options for OAB including pelvic PT, medication, intravesical botox, SNM or PTNS.  -She would like to try another medication at this time. Prescribed Myrbetriq  50mg  daily.  - For SUI, reviewed options of urethral bulking and a sling. She has lost about 20 lbs since the beginning of the year and is working on further weight loss. We discussed that BMI closer to 40 would be more optimal for a sling.  - She wants to avoid pelvic PT and surgery at this time due to multiple medical appts and recent broken arm, but may be interested in the future.   Return 2 months  Rosaline LOISE Caper, MD

## 2023-07-17 ENCOUNTER — Ambulatory Visit (INDEPENDENT_AMBULATORY_CARE_PROVIDER_SITE_OTHER): Admitting: Obstetrics and Gynecology

## 2023-07-17 ENCOUNTER — Encounter: Payer: Self-pay | Admitting: Obstetrics and Gynecology

## 2023-07-17 VITALS — BP 124/84 | Ht 67.0 in | Wt 324.6 lb

## 2023-07-17 DIAGNOSIS — N3281 Overactive bladder: Secondary | ICD-10-CM | POA: Diagnosis not present

## 2023-07-17 NOTE — Progress Notes (Signed)
 [facility] Return Visit  SUBJECTIVE  History of Present Illness: Donna [name] is a 58 y.o. female seen in follow-up for mixed incontinence. Plan at last visit was was to start vesicare  10mg  daily, had previously also tried ditropan.   She has been on myrbetriq  50mg . A few times has been able to hold it better but mostly leaks still throughout the day. She has large leakage with standing, moving, without warning.   04/23/22: TRH/BSO, SLN biopsy on left, cystoscopy, repair of large right sidewall vaginal laceration  with Dr Orvil Bland for endometrial cancer  Past Medical History: Patient  has a past medical history of Anemia, Anginal pain (HCC), Anxiety, Arthritis, Chronic back pain, CIN II (cervical intraepithelial neoplasia II) (08/2021), Depression, DOE (dyspnea on exertion), Edema of both lower extremities, Elevated blood pressure reading, Endometrial cancer (HCC), Fibromyalgia, GERD (gastroesophageal reflux disease), History of anemia, History of gastric ulcer, History of panic attacks, Hypertension, Migraines, Mixed stress and urge urinary incontinence, Pneumonia, PTSD (post-traumatic stress disorder), and Vaginal Pap smear, abnormal.   Past Surgical History: She  has a past surgical history that includes Appendectomy; Lumbar fusion; LEEP (N/A, 03/04/2022); Wisdom tooth extraction; Robotic assisted total hysterectomy with bilateral salpingo oophorectomy (N/A, 04/23/2022); Sentinel node biopsy (N/A, 04/23/2022); Robotic assisted lap vaginal hysterectomy (2024); and ORIF elbow fracture (Right, 12/10/2022).   Medications: She has a current medication list which includes the following prescription(s): bupropion , clonazepam , cyclobenzaprine , hydrocodone -acetaminophen , mirabegron  er, nitroglycerin, olmesartan-hydrochlorothiazide, and ventolin hfa.   Allergies: Patient is allergic to celebrex [celecoxib], ibuprofen , and oxycontin  [oxycodone  hcl].   Social History: Patient  reports that  she has never smoked. She has never used smokeless tobacco. She reports that she does not drink alcohol and does not use drugs.      OBJECTIVE     Physical Exam: Vitals:   07/17/23 0941  BP: 124/84  Weight: (!) 324 lb 9.6 oz (147.2 kg)  Height: 5\' 7"  (1.702 m)   Body mass index is 50.84 kg/m.  Gen: No apparent distress, A&O x 3.  Detailed Urogynecologic Evaluation:  Deferred.    ASSESSMENT AND PLAN    Ms. Frees is a 58 y.o. with:  No diagnosis found.  - Unclear if she has urge or stress predominant incontinence so recommend urodynamic testing.  Advised her to keep 3 day bladder diary prior to urodynamic testing and bring diary with her to her visit.  - Has had minimal improvement with both vesicare  and myrbetriq . We discussed options for OAB including pelvic PT, medication, intravesical botox, SNM or PTNS. - For SUI, reviewed options of urethral bulking and a sling. Not a candidate for sling due to increased BMI and central adiposity. Can consider urethral bulking but this will likely be less efficacious due to her BMI.    Return for urodynamics  Arma Lamp, MD

## 2023-08-11 ENCOUNTER — Encounter: Admitting: Obstetrics and Gynecology

## 2023-08-25 ENCOUNTER — Other Ambulatory Visit (HOSPITAL_COMMUNITY)
Admission: RE | Admit: 2023-08-25 | Discharge: 2023-08-25 | Disposition: A | Discharge status: Home / Self Care | Source: Other Acute Inpatient Hospital | Attending: Obstetrics and Gynecology | Admitting: Obstetrics and Gynecology

## 2023-08-25 ENCOUNTER — Ambulatory Visit (INDEPENDENT_AMBULATORY_CARE_PROVIDER_SITE_OTHER): Admitting: Obstetrics and Gynecology

## 2023-08-25 VITALS — BP 114/73 | HR 81

## 2023-08-25 DIAGNOSIS — R319 Hematuria, unspecified: Secondary | ICD-10-CM | POA: Insufficient documentation

## 2023-08-25 DIAGNOSIS — N393 Stress incontinence (female) (male): Secondary | ICD-10-CM | POA: Diagnosis not present

## 2023-08-25 DIAGNOSIS — N3281 Overactive bladder: Secondary | ICD-10-CM

## 2023-08-25 DIAGNOSIS — R948 Abnormal results of function studies of other organs and systems: Secondary | ICD-10-CM

## 2023-08-25 DIAGNOSIS — R350 Frequency of micturition: Secondary | ICD-10-CM

## 2023-08-25 LAB — URINALYSIS, ROUTINE W REFLEX MICROSCOPIC
Bilirubin Urine: NEGATIVE
Glucose, UA: NEGATIVE mg/dL
Ketones, ur: NEGATIVE mg/dL
Leukocytes,Ua: NEGATIVE
Nitrite: POSITIVE — AB
Protein, ur: 100 mg/dL — AB
Specific Gravity, Urine: 1.016 (ref 1.005–1.030)
pH: 5 (ref 5.0–8.0)

## 2023-08-25 LAB — POCT URINALYSIS DIPSTICK
Bilirubin, UA: NEGATIVE
Glucose, UA: NEGATIVE
Ketones, UA: NEGATIVE
Leukocytes, UA: NEGATIVE
Nitrite, UA: POSITIVE
Protein, UA: POSITIVE — AB
Spec Grav, UA: 1.025 (ref 1.010–1.025)
Urobilinogen, UA: 1 U/dL
pH, UA: 5.5 (ref 5.0–8.0)

## 2023-08-25 NOTE — Progress Notes (Signed)
 [facility] Urodynamics Procedure  Referring Physician: Claudene Manus, [place] Date of Procedure: 08/25/2023  Donna [name] is a 58 y.o. female who presents for urodynamic evaluation. Indication(s) for study: mixed incontinence  Vital Signs: BP 114/73   Pulse 81   LMP 04/15/2022 (Approximate)   Laboratory Results: A catheterized urine specimen revealed:  POC urine:  Lab Results  Component Value Date   COLORU Yellow 08/25/2023   CLARITYU Cloudy 08/25/2023   GLUCOSEUR Negative 08/25/2023   BILIRUBINUR Negative 08/25/2023   KETONESU Negative 08/25/2023   SPECGRAV 1.025 08/25/2023   RBCUR Large 08/25/2023   PHUR 5.5 08/25/2023   PROTEINUR Positive (A) 08/25/2023   UROBILINOGEN 1.0 08/25/2023   LEUKOCYTESUR Negative 08/25/2023     Voiding Diary: Deferred   Procedure Timeout:  The correct patient was verified and the correct procedure was verified. The patient was in the correct position and safety precautions were reviewed based on at the patient's history.  Urodynamic Procedure A 55F dual lumen urodynamics catheter was placed under sterile conditions into the patient's bladder. A 55F catheter was placed into the rectum in order to measure abdominal pressure. EMG patches were placed in the appropriate position.  All connections were confirmed and calibrations/adjusted made. Saline was instilled into the bladder through the dual lumen catheters.  Cough/valsalva pressures were measured periodically during filling.  Patient was allowed to void.  The bladder was then emptied of its residual.  UROFLOW: Revealed a Qmax of 19 mL/sec.  She voided 92 mL and had a residual of 5 mL.  It was a normal pattern and represented normal habits though interpretation limited due to low voided volume.  CMG: This was performed with sterile water  in the sitting position at a fill rate of 30 mL/min.    First sensation of fullness was 76 mLs,  First urge was 158 mLs,  Strong urge was 285 mLs  and  Capacity was 349 mLs  Stress incontinence was demonstrated Highest positive CLPP was 115 cmH20 at 252 ml. Highest negative VLPP was 116 cmH20at 252 ml.   Detrusor function was overactive, with phasic contractions seen.  The first occurred at 29 mL to 3 cm of water  and was not associated with urge.  Compliance:  Normal. End fill detrusor pressure was 1.4cmH20.  Calculated compliance was 255mL/cmH20  UPP: MUCP with barrier reduction was 68 cm of water .    MICTURITION STUDY: Voiding was performed without reduction in the sitting position.  Pdet at Qmax was 14.3 cm of water .  Qmax was 14.3 mL/sec.  It was a normal pattern.  She voided 347 mL and had a residual of 2 mL.  It was a volitional void, sustained detrusor contraction was present and abdominal straining was not present  EMG: This was performed with patches.  She had voluntary contractions, recruitment with fill was not present and urethral sphincter was relaxed with void.  The details of the procedure with the study tracings have been scanned into EPIC.   Urodynamic Impression:  1. Sensation was normal; capacity was reduced 2. Stress Incontinence was demonstrated at normal pressures; 3. Detrusor Overactivity was demonstrated with leakage. 4. Emptying was normal with a normal PVR, a sustained detrusor contraction present,  abdominal straining not present, normal urethral sphincter activity on EMG.  Plan: - The patient will follow up  to discuss the findings and treatment options.  -Will send urine for culture and micro Patient is leaning towards SNM after discussion today due to Terminal DO with standing  and loss of urine without sensation related to DO activity during testing.

## 2023-08-27 ENCOUNTER — Other Ambulatory Visit: Payer: Self-pay | Admitting: Obstetrics and Gynecology

## 2023-08-27 LAB — URINE CULTURE: Culture: 80000 — AB

## 2023-08-27 MED ORDER — SULFAMETHOXAZOLE-TRIMETHOPRIM 800-160 MG PO TABS
1.0000 | ORAL_TABLET | Freq: Two times a day (BID) | ORAL | 0 refills | Status: AC
Start: 2023-08-27 — End: 2023-08-30

## 2023-08-31 ENCOUNTER — Ambulatory Visit: Payer: Self-pay

## 2023-09-01 ENCOUNTER — Ambulatory Visit: Admitting: Obstetrics and Gynecology

## 2023-09-01 NOTE — Progress Notes (Deleted)
 [facility] Return Visit  SUBJECTIVE  History of Present Illness: Donna [name] is a 58 y.o. female seen in follow-up for ***. Plan at last visit was ***.   Urodynamic Impression:  1. Sensation was normal; capacity was reduced 2. Stress Incontinence was demonstrated at normal pressures; 3. Detrusor Overactivity was demonstrated with leakage. 4. Emptying was normal with a normal PVR, a sustained detrusor contraction present,  abdominal straining not present, normal urethral sphincter activity on EMG.  Past Medical History: Patient  has a past medical history of Anemia, Anginal pain (HCC), Anxiety, Arthritis, Chronic back pain, CIN II (cervical intraepithelial neoplasia II) (08/2021), Depression, DOE (dyspnea on exertion), Edema of both lower extremities, Elevated blood pressure reading, Endometrial cancer (HCC), Fibromyalgia, GERD (gastroesophageal reflux disease), History of anemia, History of gastric ulcer, History of panic attacks, Hypertension, Migraines, Mixed stress and urge urinary incontinence, Pneumonia, PTSD (post-traumatic stress disorder), and Vaginal Pap smear, abnormal.   Past Surgical History: She  has a past surgical history that includes Appendectomy; Lumbar fusion; LEEP (N/A, 03/04/2022); Wisdom tooth extraction; Robotic assisted total hysterectomy with bilateral salpingo oophorectomy (N/A, 04/23/2022); Sentinel node biopsy (N/A, 04/23/2022); Robotic assisted lap vaginal hysterectomy (2024); and ORIF elbow fracture (Right, 12/10/2022).   Medications: She has a current medication list which includes the following prescription(s): bupropion , clonazepam , cyclobenzaprine , hydrocodone -acetaminophen , mirabegron  er, nitroglycerin, olmesartan-hydrochlorothiazide, and ventolin hfa.   Allergies: Patient is allergic to celebrex [celecoxib], ibuprofen , and oxycontin  [oxycodone  hcl].   Social History: Patient  reports that she has never smoked. She has never used smokeless  tobacco. She reports that she does not drink alcohol and does not use drugs.     OBJECTIVE     Physical Exam: There were no vitals filed for this visit. Gen: No apparent distress, A&O x 3.  Detailed Urogynecologic Evaluation:  Deferred. Prior exam showed:      No data to display             ASSESSMENT AND PLAN    Donna [name] is a 58 y.o. with:  No diagnosis found.  There are no diagnoses linked to this encounter.   Donna LOISE Caper, MD

## 2023-09-05 ENCOUNTER — Other Ambulatory Visit: Payer: Self-pay | Admitting: Obstetrics and Gynecology

## 2023-09-05 DIAGNOSIS — N3281 Overactive bladder: Secondary | ICD-10-CM

## 2023-09-10 HISTORY — PX: COLONOSCOPY: SHX174

## 2023-11-11 ENCOUNTER — Encounter: Payer: Self-pay | Admitting: Obstetrics and Gynecology

## 2023-11-11 ENCOUNTER — Ambulatory Visit (INDEPENDENT_AMBULATORY_CARE_PROVIDER_SITE_OTHER): Admitting: Obstetrics and Gynecology

## 2023-11-11 VITALS — BP 140/82 | HR 101

## 2023-11-11 DIAGNOSIS — N3281 Overactive bladder: Secondary | ICD-10-CM | POA: Diagnosis not present

## 2023-11-11 DIAGNOSIS — N393 Stress incontinence (female) (male): Secondary | ICD-10-CM | POA: Diagnosis not present

## 2023-11-11 NOTE — Progress Notes (Signed)
 Donna [name] Return Visit  SUBJECTIVE  History of Present Illness: Donna [name] is a 58 y.o. female seen in follow-up for mixed incontinence. No improvement with ditropan, vesicare  or myrbetriq .   Urodynamic Impression:  1. Sensation was normal; capacity was reduced 2. Stress Incontinence was demonstrated at normal pressures; 3. Detrusor Overactivity was demonstrated with leakage. Terminal DO with standing and loss of urine without sensation related to DO 4. Emptying was normal with a normal PVR, a sustained detrusor contraction present,  abdominal straining not present, normal urethral sphincter activity on EMG.   04/23/22: TRH/BSO, SLN biopsy on left, cystoscopy, repair of large right sidewall vaginal laceration  with Dr Viktoria for endometrial cancer  Past Medical History: Patient  has a past medical history of Anemia, Anginal pain (HCC), Anxiety, Arthritis, Chronic back pain, CIN II (cervical intraepithelial neoplasia II) (08/2021), Depression, DOE (dyspnea on exertion), Edema of both lower extremities, Elevated blood pressure reading, Endometrial cancer (HCC), Fibromyalgia, GERD (gastroesophageal reflux disease), History of anemia, History of gastric ulcer, History of panic attacks, Hypertension, Migraines, Mixed stress and urge urinary incontinence, Pneumonia, PTSD (post-traumatic stress disorder), and Vaginal Pap smear, abnormal.   Past Surgical History: She  has a past surgical history that includes Appendectomy; Lumbar fusion; LEEP (N/A, 03/04/2022); Wisdom tooth extraction; Robotic assisted total hysterectomy with bilateral salpingo oophorectomy (N/A, 04/23/2022); Sentinel node biopsy (N/A, 04/23/2022); Robotic assisted lap vaginal hysterectomy (2024); and ORIF elbow fracture (Right, 12/10/2022).   Medications: She has a current medication list which includes the following prescription(s): bupropion , clonazepam , cyclobenzaprine , hydrocodone -acetaminophen , mirabegron  er,  nitroglycerin, olmesartan-hydrochlorothiazide, and ventolin hfa.   Allergies: Patient is allergic to celebrex [celecoxib], ibuprofen , and oxycontin  [oxycodone  hcl].   Social History: Patient  reports that she has never smoked. She has never used smokeless tobacco. She reports that she does not drink alcohol and does not use drugs.      OBJECTIVE     Physical Exam: Vitals:   11/11/23 1015  BP: (!) 140/82  Pulse: (!) 101     Gen: No apparent distress, A&O x 3.  Detailed Urogynecologic Evaluation:  Deferred.     Reviewed images of sacral x-ray performed in 2021 and hardware does not extend down past S1 ASSESSMENT AND PLAN    Donna [name] is a 58 y.o. with:  1. Overactive bladder   2. SUI (stress urinary incontinence, female)     -For refractory OAB we reviewed the procedure for intravesical Botox injection with cystoscopy in the office and reviewed the risks, benefits and alternatives of treatment including but not limited to infection, need for self-catheterization and need for repeat therapy.  We discussed that there is a 5-15% chance of needing to catheterize with Botox and that this usually resolves in a few months; however can persist for longer periods of time.  Typically Botox injections would need to be repeated every 3-12 months since this is not a permanent therapy.  - We discussed the role of sacral neuromodulation and how it works. It requires a test phase, and documentation of bladder function via diary. After a successful test period, a permanent wire and generator are placed in the OR. The battery lasts 10-15 years on average and would need to be replaced surgically.  The goal of this therapy is at least a 50% improvement in symptoms. It is NOT realistic to expect a 100% cure.  We reviewed the fact that about 30% of patients fail the test phase and are not candidates for permanent generator placement.  For all procedures, we discussed risks of bleeding, infection, damage to  surrounding organs, numbness and weakness at any body site, buttock pain, and the rarer risks of blood clot, heart attack, pneumonia, death.   - We also discussed the role of percutaneous tibial nerve stimulation and how it works.  She understands it requires 12 weekly visits for temporary neuromodulation of the sacral nerve roots via the tibial nerve and that she may then require continued tapered treatment.   - She would like to proceed with sacral nerve stimulation. Handouts provided to her on medtronic device along with the bladder diary. She prefers to do stage 1 in the OR due to history of back pain with prior surgeries.  - We discussed that options for SUI include urethral bulking and a sling but may not be as effective due to her increased BMI. She is currently on medication for weight loss and has already lost 20 lbs. The SUI is less bothersome for her so we can reassess symptoms after SNM placement.   Request sent for surgery scheduling  Donna LOISE Caper, MD

## 2023-11-13 ENCOUNTER — Other Ambulatory Visit (HOSPITAL_COMMUNITY): Payer: Self-pay | Admitting: Gastroenterology

## 2023-11-13 DIAGNOSIS — R1319 Other dysphagia: Secondary | ICD-10-CM

## 2023-12-09 ENCOUNTER — Encounter: Payer: Self-pay | Admitting: Gynecologic Oncology

## 2023-12-11 ENCOUNTER — Telehealth: Payer: Self-pay | Admitting: Obstetrics and Gynecology

## 2023-12-11 ENCOUNTER — Inpatient Hospital Stay: Admitting: Gynecologic Oncology

## 2023-12-11 ENCOUNTER — Telehealth: Payer: Self-pay | Admitting: *Deleted

## 2023-12-11 DIAGNOSIS — C541 Malignant neoplasm of endometrium: Secondary | ICD-10-CM
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
## 2023-12-11 NOTE — Telephone Encounter (Signed)
 Pt called c/o incontinence getting worse.  Wants to see where we are in the process of scheduling surgery.

## 2023-12-23 ENCOUNTER — Inpatient Hospital Stay: Attending: Obstetrics & Gynecology | Admitting: Obstetrics & Gynecology

## 2024-01-01 ENCOUNTER — Other Ambulatory Visit (HOSPITAL_COMMUNITY)

## 2024-01-01 ENCOUNTER — Encounter (HOSPITAL_COMMUNITY): Payer: Self-pay

## 2024-01-11 NOTE — Telephone Encounter (Signed)
 Patient returning call for scheduler.

## 2024-01-18 ENCOUNTER — Encounter: Payer: Self-pay | Admitting: Obstetrics and Gynecology

## 2024-02-11 ENCOUNTER — Encounter (HOSPITAL_COMMUNITY): Payer: Self-pay | Admitting: Obstetrics and Gynecology

## 2024-02-11 ENCOUNTER — Encounter: Payer: Self-pay | Admitting: Obstetrics and Gynecology

## 2024-02-11 ENCOUNTER — Ambulatory Visit: Admitting: Obstetrics and Gynecology

## 2024-02-11 VITALS — BP 136/85 | HR 78 | Ht 68.0 in | Wt 320.0 lb

## 2024-02-11 DIAGNOSIS — R0602 Shortness of breath: Secondary | ICD-10-CM

## 2024-02-11 DIAGNOSIS — R609 Edema, unspecified: Secondary | ICD-10-CM | POA: Diagnosis not present

## 2024-02-11 DIAGNOSIS — L819 Disorder of pigmentation, unspecified: Secondary | ICD-10-CM

## 2024-02-11 LAB — BRAIN NATRIURETIC PEPTIDE: BNP: 26.8 pg/mL (ref 0.0–100.0)

## 2024-02-11 NOTE — Progress Notes (Signed)
 [facility] Return Visit  SUBJECTIVE  History of Present Illness: Donna [name] is a 58 y.o. female who presents today for a pre-op visit for SNM. Prior to starting the Pre-op her son called attention to patient's health state and said she has been having significant swelling bilaterally with purple extremities. He reports she has had swelling before but this is significantly worse. Patient also endorsed she has had some increasing shortness of breath.     Past Medical History: Patient  has a past medical history of Anemia, Anginal pain, Anxiety, Arthritis, Chronic back pain, CIN II (cervical intraepithelial neoplasia II) (08/2021), Depression, DOE (dyspnea on exertion), Edema of both lower extremities, Elevated blood pressure reading, Endometrial cancer (HCC), Fibromyalgia, GERD (gastroesophageal reflux disease), History of anemia, History of gastric ulcer, History of panic attacks, Hypertension, Migraines, Mixed stress and urge urinary incontinence, Pneumonia, PTSD (post-traumatic stress disorder), and Vaginal Pap smear, abnormal.   Past Surgical History: She  has a past surgical history that includes Appendectomy; Lumbar fusion; LEEP (N/A, 03/04/2022); Wisdom tooth extraction; Robotic assisted total hysterectomy with bilateral salpingo oophorectomy (N/A, 04/23/2022); Sentinel node biopsy (N/A, 04/23/2022); Robotic assisted lap vaginal hysterectomy (2024); ORIF elbow fracture (Right, 12/10/2022); and Colonoscopy (09/10/2023).   Medications: She has a current medication list which includes the following prescription(s): bupropion , clonazepam , cyclobenzaprine , hydrocodone -acetaminophen , nitroglycerin, olmesartan-hydrochlorothiazide, ondansetron , pravastatin, ropinirole, ventolin hfa, vitamin d  (ergocalciferol ), and vraylar.   Allergies: Patient is allergic to celebrex [celecoxib], ibuprofen , and oxycontin  [oxycodone  hcl].   Social History: Patient  reports that she has never smoked.  She has never used smokeless tobacco. She reports that she does not drink alcohol and does not use drugs.     OBJECTIVE     Physical Exam: Vitals:   02/11/24 1436  BP: 136/85  Pulse: 78  Weight: (!) 320 lb (145.2 kg)  Height: 5' 8 (1.727 m)   Gen: No apparent distress, A&O x 3.  Patient's bilateral extremities are purple in color, +1 pitting edema noted bilaterally with skin blanching with pressure. Patient speaking in full sentences and not actively short of breath at this time. No heat noted to the calves.       ASSESSMENT AND PLAN    Donna [name] is a 58 y.o. with:  1. Shortness of breath   2. Pitting edema   3. Discoloration of skin of multiple sites of lower extremity    Discussed with patient that if her shortness of breath is significantly worsening she needs to seek ER evaluation. Patient has not been worked up by cardiology, so Urgent referral placed at the suggestion of cardiology admin team. Spoke to Cardiology Dr. Who suggested ordered CBC, CMP, and BNP for some baseline labs pending her referral. Patient reports understanding about ER precautions and her son does as well. Labs ordered on patient and cardiology will call her to schedule an appointment. Patient may need Echo or other stress workup.  Will plan to hold on surgery due to patient's concerning cardiac/pulm issues. Dr. Marilynne called and she agreed on this plan of care due to concern for surgical complications or poor anesthesia response.   Patient to see cardiology and we can re-evalute SNM workup after she is cleared by cardiology.   Macio Kissoon G Shlomo Seres, NP

## 2024-02-11 NOTE — Progress Notes (Signed)
 Spoke w/ via phone for pre-op interview--- Donna [name] needs dos---- BMP and EKG per anesthesia.        [name] results------ COVID test -----patient states asymptomatic no test needed Arrive at -------1330 NPO after MN NO Solid Food.  Clear liquids from MN until---1230 Pre-Surgery Ensure or G2:  Med rec completed Medications to take morning of surgery -----Bupropion , Klonopin  and Ventolin inhaler Diabetic medication -----  GLP1 agonist last dose: Wegovy 1.7mg  on med list but pt states she has not taken any in at least a month. GLP1 instructions: Told pt to not take any doses prior to surgery if she plans on restarting.  Patient instructed no nail polish to be worn day of surgery Patient instructed to bring photo id and insurance card day of surgery Patient aware to have Driver (ride ) / caregiver    for 24 hours after surgery - Son Donna [name] Patient Special Instructions ----- Pre-Op special Instructions -----  Patient verbalized understanding of instructions that were given at this phone interview. Patient denies chest pain, sob, fever, cough at the interview.

## 2024-02-12 ENCOUNTER — Telehealth: Payer: Self-pay

## 2024-02-12 ENCOUNTER — Ambulatory Visit: Payer: Self-pay | Admitting: Obstetrics and Gynecology

## 2024-02-14 LAB — CBC
Hematocrit: 41.4 % (ref 34.0–46.6)
Hemoglobin: 13.1 g/dL (ref 11.1–15.9)
MCH: 29.4 pg (ref 26.6–33.0)
MCHC: 31.6 g/dL (ref 31.5–35.7)
MCV: 93 fL (ref 79–97)
Platelets: 417 x10E3/uL (ref 150–450)
RBC: 4.45 x10E6/uL (ref 3.77–5.28)
RDW: 13.4 % (ref 11.7–15.4)
WBC: 8.5 x10E3/uL (ref 3.4–10.8)

## 2024-02-14 LAB — COMPREHENSIVE METABOLIC PANEL WITH GFR
ALT: 11 IU/L (ref 0–32)
AST: 17 IU/L (ref 0–40)
Albumin: 3.8 g/dL (ref 3.8–4.9)
Alkaline Phosphatase: 90 IU/L (ref 49–135)
BUN/Creatinine Ratio: 18 (ref 9–23)
BUN: 16 mg/dL (ref 6–24)
Bilirubin Total: 0.4 mg/dL (ref 0.0–1.2)
CO2: 27 mmol/L (ref 20–29)
Calcium: 9.2 mg/dL (ref 8.7–10.2)
Chloride: 102 mmol/L (ref 96–106)
Creatinine, Ser: 0.91 mg/dL (ref 0.57–1.00)
Globulin, Total: 3.2 g/dL (ref 1.5–4.5)
Glucose: 105 mg/dL — ABNORMAL HIGH (ref 70–99)
Potassium: 4.3 mmol/L (ref 3.5–5.2)
Sodium: 141 mmol/L (ref 134–144)
Total Protein: 7 g/dL (ref 6.0–8.5)
eGFR: 74 mL/min/1.73

## 2024-02-16 LAB — CBC WITH DIFFERENTIAL/PLATELET

## 2024-02-16 LAB — SPECIMEN STATUS REPORT

## 2024-02-22 ENCOUNTER — Ambulatory Visit (HOSPITAL_COMMUNITY): Admission: RE | Admit: 2024-02-22 | Source: Home / Self Care | Admitting: Obstetrics and Gynecology

## 2024-02-22 DIAGNOSIS — I10 Essential (primary) hypertension: Secondary | ICD-10-CM

## 2024-02-23 ENCOUNTER — Ambulatory Visit: Attending: Internal Medicine | Admitting: Internal Medicine

## 2024-02-23 VITALS — BP 160/93 | HR 79 | Ht 68.0 in | Wt 319.0 lb

## 2024-02-23 DIAGNOSIS — R609 Edema, unspecified: Secondary | ICD-10-CM | POA: Diagnosis not present

## 2024-02-23 DIAGNOSIS — I10 Essential (primary) hypertension: Secondary | ICD-10-CM | POA: Diagnosis not present

## 2024-02-23 DIAGNOSIS — R0602 Shortness of breath: Secondary | ICD-10-CM | POA: Insufficient documentation

## 2024-02-23 DIAGNOSIS — I739 Peripheral vascular disease, unspecified: Secondary | ICD-10-CM | POA: Insufficient documentation

## 2024-02-23 DIAGNOSIS — R072 Precordial pain: Secondary | ICD-10-CM | POA: Diagnosis not present

## 2024-02-23 MED ORDER — METOPROLOL TARTRATE 100 MG PO TABS: 100.0000 mg | ORAL_TABLET | Freq: Once | ORAL | 0 refills | Status: AC

## 2024-02-23 MED ORDER — FUROSEMIDE 20 MG PO TABS: 20.0000 mg | ORAL_TABLET | Freq: Every day | ORAL | 3 refills | Status: AC

## 2024-02-23 NOTE — Progress Notes (Signed)
 " Cardiology Office Note:  .    Date:  02/23/2024  ID:  OLEVA KOO, DOB [birth year], MRN [id number] PCP: Claudene Jackee ONEIDA DEVONNA  [facility] Providers Cardiologist:  None     CC: LE edema Consulted for the evaluation of swelling and leg discoloration at the behest of Ms. Zuleta  History of Present Illness: .    ALBERT DEVAUL is a 58 y.o. female with morbid obesity and LE edema   Ms. Trimarco is a 58 year old female who presents with leg swelling and discoloration. She was referred by Caitlin Zuleta, a urogynecology nurse practitioner, due to lower extremity swelling.  She has experienced significant swelling in her lower extremities for the past week and a half, which has progressively worsened. The swelling is accompanied by pain around the ankles that has extended up her legs, along with discoloration. 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 She reports a sensation of pins and needles in her leg since the fall.  She experiences breathing difficulties upon exertion, such as walking or showering, which have been ongoing for several years. Shortness of breath is severe enough to cause impaired vision, described as 'snow vision,' and faintness. She uses nitroglycerin spray for chest pain, sometimes up to three times a day. There is no formal diagnosis of obstructive sleep apnea, but she reports symptoms suggestive of the condition. Anxiety and panic attacks exacerbate her breathing difficulties.  Her medical history includes hyperglycemia, which improved with the use of Wegovy, a medication she had to discontinue two months ago due to insurance issues. She is on olmesartan hydrochlorothiazide for blood pressure management, although she occasionally forgets to take her medication.  Discussed the use of AI scribe software for clinical note transcription with the patient,  who gave verbal consent to proceed.   Relevant histories: .  Social  - comes with son ROS: As per HPI.   Studies Reviewed: SABRA    Labs BNP: Within normal limits  Radiology CT chest (2019): No coronary artery plaque  Diagnostic Venous duplex lower extremity: No deep venous thrombosis  Electrocardiogram (EKG) Isolated premature ventricular complexes (PVCs) detected.  Physical Exam:    VS:  BP (!) 160/93 (BP Location: Left Arm)   Pulse 79   Ht 5' 8 (1.727 m)   Wt (!) 319 lb (144.7 kg)   LMP 04/15/2022   SpO2 96%   BMI 48.50 kg/m    Wt Readings from Last 3 Encounters:  02/23/24 (!) 319 lb (144.7 kg)  02/11/24 (!) 320 lb (145.2 kg)  07/17/23 (!) 324 lb 9.6 oz (147.2 kg)    Gen: no distress, Super morbid obesity  Neck: unable to assess Cardiac: No Rubs or Gallops, no Murmur, RRR +2 radial pulses but with distal heart sounds Respiratory: Clear to auscultation bilaterally, normal effort, normal  respiratory rate GI: Soft, nontender, non-distended  MS: Bilateral pitting edema;  moves all extremities Integument: Skin feels cool, there is shiny appears of the skins the is discoloration of both feet, with thickening of the toenails, and time of exam PD pulses are felt Neuro:  At time of evaluation, alert and oriented to person/place/time/situation  Psych: Normal affect, patient feels poorly   ASSESSMENT AND PLAN: .    An EKG was ordered for DOE and shows  SR with rare PVCs  Bilateral  lower extremity edema with leg discoloration and pain, evaluation for arterial insufficiency - Bilateral lower extremity edema with discoloration and pain, worsening over the past week and a half. Concern for arterial insufficiency due to discoloration and pain.  - Patient has numbness in her fees with cold feer, hair loss, skin discoloration and thickening of the toenails; I am concerned for critical limb ischemia; I recommend ED evaluation and discussed risks of not proceeding with patient and  son; they have deferred in lieu of that - Ordered bilateral arterial duplex to evaluate for arterial insufficiency 02/24/24 8:30 AM and discussed red flag symptoms - Advised to go to the hospital if severe pain, loss of sensation, or discoloration worsens. - Will start Lasix 20 mg daily in addition to hydrochlorothiazide to manage fluid overload. - Will repeat basic metabolic panel in one week to monitor electrolytes. - Will consider spironolactone if potassium levels are low. - may need venous duplex repeat if no improvement  Essential hypertension - Blood pressure is elevated, possibly due to missed medication dose. Currently on olmesartan and hydrochlorothiazide. - Adding lasix and may are MRA next.  Chronic chest pain and exertional dyspnea, evaluation for coronary artery disease and heart failure Chronic daily chest pain and exertional dyspnea. Previous CT scan five years ago showed no plaque buildup. Stress test was denied by insurance. Echocardiogram results are unavailable. Concern for coronary artery disease and heart failure due to symptoms and history. - Ordered coronary CT to evaluate for significant blockages. - Ordered echocardiogram  Super Morbid obesity Contributing to multiple health issues. Previous weight loss with Georjean was effective but discontinued due to insurance issues. Interest in GLP-1 medications for weight management if A1c is normal. - Will recheck hemoglobin A1c to assess for diabetes. - Will consider GLP-1 medications if A1c is normal and insurance allows.  Suspected obstructive sleep apnea No formal testing has been done. Potential for GLP-1 medication coverage if sleep apnea is confirmed. - Recommended sleep study to evaluate for obstructive sleep apnea.  History of hyperglycemia, monitoring for diabetes Previous hyperglycemia with recent A1c within normal limits. Monitoring is necessary due to potential for diabetes development. - Will recheck  hemoglobin A1c to monitor for diabetes.  Six to 8 weeks with my team unless CLI-> would need to go to ED  Stanly Leavens, MD FASE [facility] Cardiologist [facility]  [facility]  [address] [place], [address] [place], [place] [address] [phone]  5:22 PM  "

## 2024-02-23 NOTE — Patient Instructions (Addendum)
 Medication Instructions:  Your physician has recommended you make the following change in your medication:   START: furosemide (Lasix) 20 mg by mouth once daily  *If you need a refill on your cardiac medications before your next appointment, please call your pharmacy*  Lab Work: IN 2 weeks at any [facility]: BMP, Hgb A1C  If you have labs (blood work) drawn today and your tests are completely normal, you will receive your results only by: MyChart Message (if you have MyChart) OR A paper copy in the mail If you have any lab test that is abnormal or we need to change your treatment, we will call you to review the results.  Testing/Procedures: Your physician has requested that you have a lower extremity venous duplex. This test is an ultrasound of the veins in the legs. It looks at venous blood flow that carries blood from the heart to the legs. Allow one hour for a Lower Venous exam. There are no restrictions or special instructions.  Please note: We ask at that you not bring children with you during ultrasound (echo/ vascular) testing. Due to room size and safety concerns, children are not allowed in the ultrasound rooms during exams. Our front office staff cannot provide observation of children in our lobby area while testing is being conducted. An adult accompanying a patient to their appointment will only be allowed in the ultrasound room at the discretion of the ultrasound technician under special circumstances. We apologize for any inconvenience.    Your physician has requested that you have an echocardiogram. Echocardiography is a painless test that uses sound waves to create images of your heart. It provides your doctor with information about the size and shape of your heart and how well your hearts chambers and valves are working. This procedure takes approximately one hour. There are no restrictions for this procedure. Please do NOT wear cologne, perfume, aftershave, or lotions (deodorant  is allowed). Please arrive 15 minutes prior to your appointment time.  Please note: We ask at that you not bring children with you during ultrasound (echo/ vascular) testing. Due to room size and safety concerns, children are not allowed in the ultrasound rooms during exams. Our front office staff cannot provide observation of children in our lobby area while testing is being conducted. An adult accompanying a patient to their appointment will only be allowed in the ultrasound room at the discretion of the ultrasound technician under special circumstances. We apologize for any inconvenience.    Your physician has requested that you have cardiac CT. Cardiac computed tomography (CT) is a painless test that uses an x-ray machine to take clear, detailed pictures of your heart. For further information please visit [email or web address] Please follow instruction sheet as given.    Your physician has referred you to Pulmonology for sleep apnea evaluation.    Follow-Up: At [facility], you and your health needs are our priority.  As part of our continuing mission to provide you with exceptional heart care, our providers are all part of one team.  This team includes your primary Cardiologist (physician) and Advanced Practice Providers or APPs (Physician Assistants and Nurse Practitioners) who all work together to provide you with the care you need, when you need it.  Your next appointment:   6-8 week(s)  Provider:   Stanly Leavens, MD or Raphael Bring, PA-C, Glendia Ferrier, PA-C, or Katlyn West, NP        Other Instructions   Your cardiac CT will be scheduled  at one of the below locations:   [facility] [address] [place], [place] [address] [phone] (Severe contrast allergies only)  OR   [facility] [address] [place], [place] [address] [phone]  OR   [facility] [address] [place], [place]  [address] [phone]  OR   [facility]. [facility] and Vascular Tower [address]  [place], [place] [address]  OR   [facility] [address] [place], [place] [phone]  If scheduled at [facility], please arrive at the [facility] and Children's Entrance (Entrance C2) of [facility] 30 minutes prior to test start time. You can use the FREE valet parking offered at entrance C (encouraged to control the heart rate for the test)  Proceed to the [facility] Radiology Department (first floor) to check-in and test prep.  All radiology patients and guests should use entrance C2 at [facility], accessed from [facility], even though the hospital's physical address listed is [address]  If scheduled at the Heart and Vascular Tower at [address], please enter the parking lot using the [address] entrance and use the FREE valet service at the patient drop-off area. Enter the building and check-in with registration on the main floor.  If scheduled at [facility], please arrive to the Heart and Vascular Center 15 mins early for check-in and test prep.  There is spacious parking and easy access to the radiology department from the [facility] Heart and Vascular entrance. Please enter here and check-in with the desk attendant.   If scheduled at [facility], please arrive 30 minutes early for check-in and test prep.  Please follow these instructions carefully (unless otherwise directed):  An IV will be required for this test and Nitroglycerin will be given.  Hold all erectile dysfunction medications at least 3 days (72 hrs) prior to test. (Ie viagra, cialis, sildenafil, tadalafil, etc)   On the Night Before the Test: Be sure to Drink plenty of water . Do not consume any caffeinated/decaffeinated beverages or chocolate 12 hours prior to your test. Do not take any antihistamines 12 hours prior to your  test.   On the Day of the Test: Drink plenty of water  until 1 hour prior to the test. Do not eat any food 1 hour prior to test. You may take your regular medications prior to the test.  Take metoprolol (Lopressor)100 mg by mouth two hours prior to test. If you take Furosemide/Hydrochlorothiazide/Spironolactone/Chlorthalidone, please HOLD on the morning of the test. Patients who wear a continuous glucose monitor MUST remove the device prior to scanning. FEMALES- please wear underwire-free bra if available, avoid dresses & tight clothing       After the Test: Drink plenty of water . After receiving IV contrast, you may experience a mild flushed feeling. This is normal. On occasion, you may experience a mild rash up to 24 hours after the test. This is not dangerous. If this occurs, you can take Benadryl  25 mg, Zyrtec, Claritin, or Allegra and increase your fluid intake. (Patients taking Tikosyn should avoid Benadryl , and may take Zyrtec, Claritin, or Allegra) If you experience trouble breathing, this can be serious. If it is severe call 911 IMMEDIATELY. If it is mild, please call our office.  We will call to schedule your test 2-4 weeks out understanding that some insurance companies will need an authorization prior to the service being performed.  For more information and frequently asked questions, please visit our website : [email or web address]  For non-scheduling related questions, please contact the cardiac imaging nurse navigator should you have any questions/concerns: Cardiac Imaging Nurse Navigators Direct Office Dial: [phone]   For scheduling needs, including cancellations and rescheduling, please call Brittany, [phone].

## 2024-02-24 ENCOUNTER — Ambulatory Visit (HOSPITAL_COMMUNITY)
Admission: RE | Admit: 2024-02-24 | Discharge: 2024-02-24 | Disposition: A | Discharge status: Home / Self Care | Source: Ambulatory Visit | Attending: Internal Medicine | Admitting: Internal Medicine

## 2024-02-24 DIAGNOSIS — I10 Essential (primary) hypertension: Secondary | ICD-10-CM

## 2024-02-24 DIAGNOSIS — I739 Peripheral vascular disease, unspecified: Secondary | ICD-10-CM | POA: Diagnosis present

## 2024-02-24 DIAGNOSIS — R072 Precordial pain: Secondary | ICD-10-CM

## 2024-02-24 DIAGNOSIS — R609 Edema, unspecified: Secondary | ICD-10-CM

## 2024-02-24 DIAGNOSIS — R0602 Shortness of breath: Secondary | ICD-10-CM | POA: Diagnosis present

## 2024-02-24 NOTE — Addendum Note (Signed)
 Addended by: RANDY HAMP SAILOR on: 02/24/2024 08:18 AM   Modules accepted: Orders

## 2024-02-25 LAB — VAS US ABI WITH/WO TBI: Right ABI: 1.3

## 2024-02-26 NOTE — Telephone Encounter (Signed)
 Pt was scheduled then cancelled surgery. KD CMA

## 2024-02-29 ENCOUNTER — Ambulatory Visit: Payer: Self-pay

## 2024-02-29 DIAGNOSIS — I739 Peripheral vascular disease, unspecified: Secondary | ICD-10-CM

## 2024-02-29 DIAGNOSIS — I10 Essential (primary) hypertension: Secondary | ICD-10-CM

## 2024-03-03 ENCOUNTER — Ambulatory Visit (HOSPITAL_COMMUNITY)

## 2024-03-08 NOTE — Telephone Encounter (Signed)
 Called left a detailed message with results and MD comments.    Thankfully no evidence of arterial occlusive disease as the reason for her leg swelling.  Her distal foot rash and swelling were very prominent.  She has two options - 1. She mentioned she sees a lot of clinicians and sometimes if feels like all she does; if she doesn't want to see more folks she should return to her PCP for foot care evaluation -2. I would offer her ambulatory referral to wound care for support of her bilateral feet now that we have rule out arterial disease   She has labs this week for f/u with diuretic therapy and testing for her chest pain and shortness of breath is pending.     Stanly Leavens, MD FASE [facility]  Advised pt to call or send a My Chart message with the option that is best.

## 2024-03-09 ENCOUNTER — Encounter (HOSPITAL_COMMUNITY): Payer: Self-pay

## 2024-03-10 ENCOUNTER — Ambulatory Visit (HOSPITAL_COMMUNITY): Admit: 2024-03-10 | Admitting: Obstetrics and Gynecology

## 2024-03-10 SURGERY — INSERTION, NEUROSTIMULATOR, SACRAL
Anesthesia: General

## 2024-03-11 ENCOUNTER — Ambulatory Visit (HOSPITAL_COMMUNITY)
Admission: RE | Admit: 2024-03-11 | Discharge: 2024-03-11 | Disposition: A | Discharge status: Home / Self Care | Source: Ambulatory Visit | Attending: Internal Medicine | Admitting: Internal Medicine

## 2024-03-11 DIAGNOSIS — R072 Precordial pain: Secondary | ICD-10-CM | POA: Insufficient documentation

## 2024-03-11 MED ORDER — IOHEXOL 350 MG/ML SOLN: 100.0000 mL | Freq: Once | INTRAVENOUS | Status: DC | PRN

## 2024-03-11 MED ORDER — IOHEXOL 350 MG/ML SOLN
140.0000 mL | Freq: Once | INTRAVENOUS | Status: AC | PRN
  Administered 2024-03-11: 140 mL via INTRAVENOUS

## 2024-03-11 MED ORDER — NITROGLYCERIN 0.4 MG SL SUBL
0.8000 mg | SUBLINGUAL_TABLET | Freq: Once | SUBLINGUAL | Status: AC
  Administered 2024-03-11: 0.8 mg via SUBLINGUAL

## 2024-03-16 MED ORDER — SPIRONOLACTONE 25 MG PO TABS: 25.0000 mg | ORAL_TABLET | Freq: Every day | ORAL | 3 refills | Status: AC

## 2024-03-16 NOTE — Telephone Encounter (Signed)
 Pt expresses will f/u with PCP to address lower extremity.  Reports BP still elevated last reading at a MD visit was 180.   Advised pt to start spironolactone  25 mg PO every day will need FLP and BMP in 2 weeks at any [facility].  Pt expresses concern that continues to feel winded with very little ambulation.  Reports was climbing mountains before got sick now can't clean her house or walk to her car without difficulty.  Advised will send concern to MD.  Advised better BP control may help.

## 2024-03-22 NOTE — Telephone Encounter (Signed)
 Patient was seen in office

## 2024-04-01 ENCOUNTER — Ambulatory Visit (HOSPITAL_COMMUNITY)

## 2024-04-07 ENCOUNTER — Encounter: Admitting: Obstetrics and Gynecology

## 2024-04-20 ENCOUNTER — Ambulatory Visit: Admitting: Cardiology

## 2024-05-10 ENCOUNTER — Ambulatory Visit (HOSPITAL_COMMUNITY)
